# Patient Record
Sex: Male | Born: 1985 | Race: White | Hispanic: No | Marital: Single | Smoking: Former smoker
Health system: Southern US, Community
[De-identification: ages and names within clinical notes are randomized; demographics above are authoritative.]

## PROBLEM LIST (undated history)

## (undated) DIAGNOSIS — I10 Essential (primary) hypertension: Secondary | ICD-10-CM

## (undated) DIAGNOSIS — F988 Other specified behavioral and emotional disorders with onset usually occurring in childhood and adolescence: Secondary | ICD-10-CM

## (undated) DIAGNOSIS — R079 Chest pain, unspecified: Secondary | ICD-10-CM

## (undated) DIAGNOSIS — R0789 Other chest pain: Secondary | ICD-10-CM

## (undated) DIAGNOSIS — K219 Gastro-esophageal reflux disease without esophagitis: Secondary | ICD-10-CM

## (undated) DIAGNOSIS — G4730 Sleep apnea, unspecified: Secondary | ICD-10-CM

## (undated) DIAGNOSIS — R6889 Other general symptoms and signs: Secondary | ICD-10-CM

## (undated) DIAGNOSIS — F32A Depression, unspecified: Secondary | ICD-10-CM

## (undated) DIAGNOSIS — N200 Calculus of kidney: Secondary | ICD-10-CM

## (undated) DIAGNOSIS — F329 Major depressive disorder, single episode, unspecified: Secondary | ICD-10-CM

## (undated) DIAGNOSIS — R7303 Prediabetes: Secondary | ICD-10-CM

## (undated) HISTORY — DX: Other specified behavioral and emotional disorders with onset usually occurring in childhood and adolescence: F98.8

## (undated) HISTORY — DX: Gastro-esophageal reflux disease without esophagitis: K21.9

## (undated) HISTORY — DX: Other general symptoms and signs: R68.89

## (undated) HISTORY — DX: Sleep apnea, unspecified: G47.30

## (undated) HISTORY — DX: Calculus of kidney: N20.0

## (undated) HISTORY — DX: Essential (primary) hypertension: I10

## (undated) HISTORY — DX: Other chest pain: R07.89

## (undated) HISTORY — DX: Depression, unspecified: F32.A

## (undated) HISTORY — PX: KIDNEY STONE SURGERY: SHX686

## (undated) HISTORY — DX: Chest pain, unspecified: R07.9

## (undated) HISTORY — PX: FACIAL COSMETIC SURGERY: SHX629

## (undated) HISTORY — PX: OTHER SURGICAL HISTORY: SHX169

## (undated) HISTORY — DX: Major depressive disorder, single episode, unspecified: F32.9

---
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
## 2004-12-11 ENCOUNTER — Ambulatory Visit: Payer: Self-pay | Admitting: Internal Medicine

## 2009-02-13 ENCOUNTER — Telehealth: Payer: Self-pay | Admitting: Internal Medicine

## 2009-02-16 ENCOUNTER — Ambulatory Visit: Payer: Self-pay | Admitting: Internal Medicine

## 2009-02-16 DIAGNOSIS — G4730 Sleep apnea, unspecified: Secondary | ICD-10-CM

## 2009-02-16 DIAGNOSIS — G4700 Insomnia, unspecified: Secondary | ICD-10-CM | POA: Insufficient documentation

## 2009-02-17 ENCOUNTER — Encounter: Payer: Self-pay | Admitting: Internal Medicine

## 2009-02-17 DIAGNOSIS — K219 Gastro-esophageal reflux disease without esophagitis: Secondary | ICD-10-CM | POA: Insufficient documentation

## 2009-02-17 DIAGNOSIS — F329 Major depressive disorder, single episode, unspecified: Secondary | ICD-10-CM | POA: Insufficient documentation

## 2009-02-17 DIAGNOSIS — F3289 Other specified depressive episodes: Secondary | ICD-10-CM | POA: Insufficient documentation

## 2009-03-14 ENCOUNTER — Ambulatory Visit: Payer: Self-pay | Admitting: Internal Medicine

## 2009-03-15 ENCOUNTER — Telehealth: Payer: Self-pay | Admitting: Internal Medicine

## 2009-09-29 ENCOUNTER — Telehealth: Payer: Self-pay | Admitting: Internal Medicine

## 2010-03-06 ENCOUNTER — Ambulatory Visit: Payer: Self-pay | Admitting: Internal Medicine

## 2010-03-06 DIAGNOSIS — F909 Attention-deficit hyperactivity disorder, unspecified type: Secondary | ICD-10-CM | POA: Insufficient documentation

## 2010-08-26 LAB — CONVERTED CEMR LAB
Basophils Absolute: 0.1 10*3/uL (ref 0.0–0.1)
Basophils Relative: 1 % (ref 0.0–3.0)
Cholesterol: 236 mg/dL — ABNORMAL HIGH (ref 0–200)
Direct LDL: 183.3 mg/dL
Eosinophils Absolute: 0.2 10*3/uL (ref 0.0–0.7)
Eosinophils Relative: 2.8 % (ref 0.0–5.0)
HCT: 44.4 % (ref 39.0–52.0)
HDL: 27.4 mg/dL — ABNORMAL LOW (ref >39.00)
Hemoglobin: 15.3 g/dL (ref 13.0–17.0)
Lymphocytes Relative: 43.5 % (ref 12.0–46.0)
Lymphs Abs: 2.5 10*3/uL (ref 0.7–4.0)
MCHC: 34.6 g/dL (ref 30.0–36.0)
MCV: 87.1 fL (ref 78.0–100.0)
Monocytes Absolute: 0.4 10*3/uL (ref 0.1–1.0)
Monocytes Relative: 7.7 % (ref 3.0–12.0)
Neutro Abs: 2.6 10*3/uL (ref 1.4–7.7)
Neutrophils Relative %: 45 % (ref 43.0–77.0)
Platelets: 232 10*3/uL (ref 150.0–400.0)
RBC: 5.1 M/uL (ref 4.22–5.81)
RDW: 12.1 % (ref 11.5–14.6)
TSH: 0.89 microintl units/mL (ref 0.35–5.50)
Total CHOL/HDL Ratio: 9
Triglycerides: 148 mg/dL (ref 0.0–149.0)
VLDL: 29.6 mg/dL (ref 0.0–40.0)
WBC: 5.8 10*3/uL (ref 4.5–10.5)

## 2010-08-28 NOTE — Progress Notes (Signed)
Summary: re establish  Phone Note Call from Patient Call back at [phone]   Caller: Mom-Kay Summary of Call: pt has not been seen since 2006 and would like to be seen asap... having bp problems 142/101 Initial call taken by: Migdalia Dk,  February 13, 2009 4:20 PM  Follow-up for Phone Call        OK to re-est Follow-up by: Tresa Garter MD,  February 13, 2009 6:12 PM  Additional Follow-up for Phone Call Additional follow up Details #1::        MOTHER WANTED SOONER THAN DR. PLOTNIKOV HAD (MID AUG).  SHE SCHEDULED HIM TO SEE DR. Yetta Barre ON JULY 22. Additional Follow-up by: Hilarie Fredrickson,  February 14, 2009 9:46 AM

## 2010-08-28 NOTE — Letter (Signed)
Summary: Results Follow-up Letter  [facility]  [address] [place], [place] [address]   Phone: [phone]  Fax: [phone]    02/17/2009  [address] [place], [place]  [address]  Dear Cory Little,   The following are the results of your recent test(s):  Test     Result     CBC       normal Thyroid     normal   _________________________________________________________  Please call for an appointment in 1-2 months _________________________________________________________ _________________________________________________________ _________________________________________________________  Sincerely,  Sanda Linger MD [facility]

## 2010-08-28 NOTE — Progress Notes (Signed)
Summary: med alternative  Phone Note From Pharmacy   Caller: [facility] Reason for Call: Medication not on formulary Summary of Call: Per pharmacy insurance will not cover Lunesta 3mg . The preferred are zaleplon or zolpidem only. Please advise Initial call taken by: Rock Nephew CMA,  March 15, 2009 10:51 AM  Follow-up for Phone Call        please call this in Follow-up by: Etta Grandchild MD,  March 15, 2009 11:03 AM  Additional Follow-up for Phone Call Additional follow up Details #1::        rx called in Additional Follow-up by: Rock Nephew CMA,  March 15, 2009 11:42 AM    New/Updated Medications: AMBIEN 10 MG TABS (ZOLPIDEM TARTRATE) One by mouth at bedtime as needed for insomnia Prescriptions: AMBIEN 10 MG TABS (ZOLPIDEM TARTRATE) One by mouth at bedtime as needed for insomnia  #30 x 2   Entered by:   Rock Nephew CMA   Authorized by:   Etta Grandchild MD   Signed by:   Rock Nephew CMA on 03/15/2009   Method used:   Telephoned to ...       [facility] [address] [phone]* (retail)       [address]       [place], [place]  [address]       Ph: [phone] or [phone]       Fax: [phone]   RxID:   [id number] AMBIEN 10 MG TABS (ZOLPIDEM TARTRATE) One by mouth at bedtime as needed for insomnia  #30 x 2   Entered and Authorized by:   Etta Grandchild MD   Signed by:   Etta Grandchild MD on 03/15/2009   Method used:   Historical   RxID:   [id number]

## 2010-08-28 NOTE — Progress Notes (Signed)
  Phone Note Refill Request Message from:  Fax from Pharmacy on September 29, 2009 2:12 PM  Refills Requested: Medication #1:  AMBIEN 10 MG TABS One by mouth at bedtime as needed for insomnia.   Dosage confirmed as above?Dosage Confirmed   Supply Requested: 1 month   Last Refilled: 07/28/2009  Method Requested: Telephone to Pharmacy Initial call taken by: Rock Nephew CMA,  September 29, 2009 2:13 PM    Prescriptions: AMBIEN 10 MG TABS (ZOLPIDEM TARTRATE) One by mouth at bedtime as needed for insomnia  #30 x 4   Entered by:   Rock Nephew CMA   Authorized by:   Etta Grandchild MD   Signed by:   Rock Nephew CMA on 09/29/2009   Method used:   Telephoned to ...       [facility] [address] [phone]* (retail)       [address]       [place], [place]  [address]       Ph: [phone] or [phone]       Fax: [phone]   RxID:   [id number]

## 2010-08-28 NOTE — Assessment & Plan Note (Signed)
Summary: FOLLOW UP RX-LB   Vital Signs:  Patient profile:   25 year old male Height:      72 inches Weight:      278 pounds BMI:     37.84 O2 Sat:      97 % on Room air Temp:     97.7 degrees F oral Pulse rate:   69 / minute Pulse rhythm:   regular Resp:     16 per minute BP sitting:   120 / 82  (left arm) Cuff size:   large  Vitals Entered By: Rock Nephew CMA (March 06, 2010 8:50 AM)  Nutrition Counseling: Patient's BMI is greater than 25 and therefore counseled on weight management options.  O2 Flow:  Room air CC: follow-up visit// need med refilll Is Patient Diabetic? No Pain Assessment Patient in pain? no        Primary Care Provider:  Etta Grandchild MD  CC:  follow-up visit// need med refilll.  History of Present Illness: He returns for f/up and requests an Rx for Adderall, says that he has a hx. of ADHD and he is starting school at [employer] this fall and needs better focus and attention to perform well in school. He still occasionall has insomnia and is doing well with the occasional dose of Ambien.  Preventive Screening-Counseling & Management  Alcohol-Tobacco     Alcohol drinks/day: 0     Smoking Status: quit < 6 months     Smoking Cessation Counseling: yes     Smoke Cessation Stage: quit     Year Quit: 2011     Tobacco Counseling: to remain off tobacco products  Hep-HIV-STD-Contraception     Hepatitis Risk: no risk noted     HIV Risk: no risk noted     STD Risk: no risk noted  Safety-Violence-Falls     Seat Belt Use: yes     Helmet Use: yes     Firearms in the Home: no firearms in the home     Smoke Detectors: no     Violence in the Home: no risk noted     Sexual Abuse: no      Sexual History:  currently monogamous.        Drug Use:  never.        Blood Transfusions:  no.    Clinical Review Panels:  Immunizations   Last Tetanus Booster:  Tdap (07/29/2000)  Lipid Management   Cholesterol:  236 (02/16/2009)   HDL (good cholesterol):   60.45 (02/16/2009)  CBC   WBC:  5.8 (02/16/2009)   RBC:  5.10 (02/16/2009)   Hgb:  15.3 (02/16/2009)   Hct:  44.4 (02/16/2009)   Platelets:  232.0 (02/16/2009)   MCV  87.1 (02/16/2009)   MCHC  34.6 (02/16/2009)   RDW  12.1 (02/16/2009)   PMN:  45.0 (02/16/2009)   Lymphs:  43.5 (02/16/2009)   Monos:  7.7 (02/16/2009)   Eosinophils:  2.8 (02/16/2009)   Basophil:  1.0 (02/16/2009)   Medications Prior to Update: 1)  Ambien 10 Mg Tabs (Zolpidem Tartrate) .... One By Mouth At Bedtime As Needed For Insomnia  Current Medications (verified): 1)  Ambien 10 Mg Tabs (Zolpidem Tartrate) .... One By Mouth At Bedtime As Needed For Insomnia 2)  Adderall 20 Mg Tabs (Amphetamine-Dextroamphetamine) .... Take 1 Tablet By Mouth Three Times A Day  Allergies (verified): No Known Drug Allergies  Past History:  Past Medical History: Last updated: 02/16/2009 Depression GERD  Past Surgical  History: Last updated: 02/16/2009 Denies surgical history  Family History: Last updated: 02/16/2009 Family History of Alcoholism/Addiction  Social History: Last updated: 03/06/2010 Occupation: unemployed Single Alcohol use-no Drug use-no Regular exercise-no  Risk Factors: Alcohol Use: 0 (03/06/2010) Exercise: no (02/16/2009)  Risk Factors: Smoking Status: quit < 6 months (03/06/2010)  Family History: Reviewed history from 02/16/2009 and no changes required. Family History of Alcoholism/Addiction  Social History: Reviewed history from 02/16/2009 and no changes required. Occupation: unemployed Single Alcohol use-no Drug use-no Regular exercise-no Smoking Status:  quit < 6 months Hepatitis Risk:  no risk noted HIV Risk:  no risk noted STD Risk:  no risk noted Seat Belt Use:  yes Sexual History:  currently monogamous Drug Use:  never Blood Transfusions:  no  Review of Systems  The patient denies hoarseness, chest pain, dyspnea on exertion, abdominal pain, difficulty walking,  depression, enlarged lymph nodes, and testicular masses.   Psych:  Denies alternate hallucination ( auditory/visual), anxiety, depression, easily angered, easily tearful, irritability, mental problems, panic attacks, suicidal thoughts/plans, thoughts of violence, unusual visions or sounds, and thoughts /plans of harming others.  Physical Exam  General:  alert, well-developed, well-nourished, well-hydrated, cooperative to examination, good hygiene, and overweight-appearing.   Head:  normocephalic and atraumatic.   Mouth:  Oral mucosa and oropharynx without lesions or exudates.  Teeth in good repair. Neck:  No deformities, masses, or tenderness noted. Lungs:  Normal respiratory effort, chest expands symmetrically. Lungs are clear to auscultation, no crackles or wheezes. Heart:  Normal rate and regular rhythm. S1 and S2 normal without gallop, murmur, click, rub or other extra sounds. Abdomen:  soft, non-tender, normal bowel sounds, no distention, no masses, no guarding, no rigidity, no rebound tenderness, no abdominal hernia, and no inguinal hernia.   Msk:  No deformity or scoliosis noted of thoracic or lumbar spine.   Pulses:  R and L carotid,radial,femoral,dorsalis pedis and posterior tibial pulses are full and equal bilaterally Extremities:  No clubbing, cyanosis, edema, or deformity noted with normal full range of motion of all joints.   Neurologic:  No cranial nerve deficits noted. Station and gait are normal. Plantar reflexes are down-going bilaterally. DTRs are symmetrical throughout. Sensory, motor and coordinative functions appear intact. Skin:  turgor normal, color normal, no rashes, no suspicious lesions, no ecchymoses, no petechiae, no purpura, no ulcerations, no edema, and tattoo(s).   Cervical Nodes:  No lymphadenopathy noted Psych:  Cognition and judgment appear intact. Alert and cooperative with normal attention span and concentration. No apparent delusions, illusions,  hallucinations   Impression & Recommendations:  Problem # 1:  ADHD (ICD-314.01) ritalin  Problem # 2:  INSOMNIA (ICD-780.52) Assessment: Unchanged  His updated medication list for this problem includes:    Ambien 10 Mg Tabs (Zolpidem tartrate) ..... One by mouth at bedtime as needed for insomnia  Discussed sleep hygiene.   Problem # 3:  DEPRESSION (ICD-311) Assessment: Improved  Discussed treatment options, including trial of antidpressant medication. Will refer to behavioral health. Follow-up call in in 24-48 hours and recheck in 2 weeks, sooner as needed. Patient agrees to call if any worsening of symptoms or thoughts of doing harm arise. Verified that the patient has no suicidal ideation at this time.   Complete Medication List: 1)  Ambien 10 Mg Tabs (Zolpidem tartrate) .... One by mouth at bedtime as needed for insomnia 2)  Adderall 20 Mg Tabs (Amphetamine-dextroamphetamine) .... Take 1 tablet by mouth three times a day  Patient Instructions: 1)  Please schedule  a follow-up appointment in 3 months. 2)  It is important that you exercise regularly at least 20 minutes 5 times a week. If you develop chest pain, have severe difficulty breathing, or feel very tired , stop exercising immediately and seek medical attention. 3)  You need to lose weight. Consider a lower calorie diet and regular exercise.  Prescriptions: ADDERALL 20 MG TABS (AMPHETAMINE-DEXTROAMPHETAMINE) Take 1 tablet by mouth three times a day  #90 x 0   Entered and Authorized by:   Etta Grandchild MD   Signed by:   Etta Grandchild MD on 03/06/2010   Method used:   Print then Give to Patient   RxID:   [id number] AMBIEN 10 MG TABS (ZOLPIDEM TARTRATE) One by mouth at bedtime as needed for insomnia  #30 x 3   Entered and Authorized by:   Etta Grandchild MD   Signed by:   Etta Grandchild MD on 03/06/2010   Method used:   Print then Give to Patient   RxID:   [id number]

## 2010-08-28 NOTE — Assessment & Plan Note (Signed)
Summary: FU/ HEADACHES/[facility]WS $50   Vital Signs:  Patient profile:   25 year old male Height:      72 inches Weight:      269 pounds O2 Sat:      97 % on Room air Temp:     97.4 degrees F oral Pulse rate:   75 / minute Pulse rhythm:   regular BP sitting:   118 / 78  (right arm) Cuff size:   large  Vitals Entered By: Rock Nephew CMA (March 14, 2009 9:54 AM)  O2 Flow:  Room air  Primary Care Provider:  Etta Grandchild MD   History of Present Illness: He returns for f/up and he requests a refill on Lunesta, it helps him stay asleep longer. He does occasionally get a headache if he doesn't get enough sleep.  Preventive Screening-Counseling & Management  Alcohol-Tobacco     Smoking Cessation Counseling: yes  Current Medications (verified): 1)  Lunesta 3 Mg Tabs (Eszopiclone) .... One Po At Bedtime As Needed For Insomnia  Allergies (verified): No Known Drug Allergies  Past History:  Past Medical History: Reviewed history from 02/16/2009 and no changes required. Depression GERD  Past Surgical History: Reviewed history from 02/16/2009 and no changes required. Denies surgical history  Family History: Reviewed history from 02/16/2009 and no changes required. Family History of Alcoholism/Addiction  Social History: Reviewed history from 02/16/2009 and no changes required. 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  The patient denies anorexia, fever, chest pain, prolonged cough, abdominal pain, difficulty walking, and depression.    Physical Exam  General:  alert, well-developed, well-nourished, well-hydrated, cooperative to examination, good hygiene, and overweight-appearing.   Mouth:  Oral mucosa and oropharynx without lesions or exudates.  Teeth in good repair. Neck:  No deformities, masses, or tenderness noted. Lungs:  Normal respiratory  effort, chest expands symmetrically. Lungs are clear to auscultation, no crackles or wheezes. Heart:  Normal rate and regular rhythm. S1 and S2 normal without gallop, murmur, click, rub or other extra sounds. Abdomen:  soft, non-tender, normal bowel sounds, no distention, no masses, no guarding, no rigidity, no rebound tenderness, no abdominal hernia, and no inguinal hernia.   Msk:  No deformity or scoliosis noted of thoracic or lumbar spine.   Extremities:  No clubbing, cyanosis, edema, or deformity noted with normal full range of motion of all joints.   Neurologic:  No cranial nerve deficits noted. Station and gait are normal. Plantar reflexes are down-going bilaterally. DTRs are symmetrical throughout. Sensory, motor and coordinative functions appear intact. Skin:  turgor normal, color normal, no rashes, no suspicious lesions, no ecchymoses, no petechiae, no purpura, no ulcerations, no edema, and tattoo(s).   Psych:  Cognition and judgment appear intact. Alert and cooperative with normal attention span and concentration. No apparent delusions, illusions, hallucinations   Impression & Recommendations:  Problem # 1:  INSOMNIA (ICD-780.52) Assessment Improved  His updated medication list for this problem includes:    Lunesta 3 Mg Tabs (Eszopiclone) ..... One po at bedtime as needed for insomnia  Problem # 2:  DEPRESSION (ICD-311) Assessment: Improved  Complete Medication List: 1)  Lunesta 3 Mg Tabs (Eszopiclone) .... One po at bedtime as needed for insomnia  Patient Instructions: 1)  Please schedule a follow-up appointment in 2 months. 2)  It is important that you exercise regularly at least 20 minutes  5 times a week. If you develop chest pain, have severe difficulty breathing, or feel very tired , stop exercising immediately and seek medical attention. 3)  You need to lose weight. Consider a lower calorie diet and regular exercise.  4)  Tobacco is very bad for your health and your loved  ones! You Should stop smoking!. 5)  Stop Smoking Tips: Choose a Quit date. Cut down before the Quit date. decide what you will do as a substitute when you feel the urge to smoke(gum,toothpick,exercise). Prescriptions: LUNESTA 3 MG TABS (ESZOPICLONE) one po at bedtime as needed for insomnia  #30 x 3   Entered and Authorized by:   Etta Grandchild MD   Signed by:   Etta Grandchild MD on 03/14/2009   Method used:   Print then Give to Patient   RxID:   [id number]

## 2010-08-28 NOTE — Letter (Signed)
Summary: Lipid Letter  [facility]  [address] [place], [place] [address]   Phone: [phone]  Fax: [phone]    02/17/2009  Cory Little [address] [place], [place]  [address]  Dear Beulah Gandy:  We have carefully reviewed your last lipid profile from  and the results are noted below with a summary of recommendations for lipid management.    Cholesterol:       236     Goal: <200   HDL "good" Cholesterol:   57.84     Goal: >40   LDL "bad" Cholesterol:   183     Goal: <130   Triglycerides:       148.0     Goal: <150    WOW. these numbers are bad.    TLC Diet (Therapeutic Lifestyle Change): Saturated Fats & Transfatty acids should be kept < 7% of total calories ***Reduce Saturated Fats Polyunstaurated Fat can be up to 10% of total calories Monounsaturated Fat Fat can be up to 20% of total calories Total Fat should be no greater than 25-35% of total calories Carbohydrates should be 50-60% of total calories Protein should be approximately 15% of total calories Fiber should be at least 20-30 grams a day ***Increased fiber may help lower LDL Total Cholesterol should be < 200mg /day Consider adding plant stanol/sterols to diet (example: Benacol spread) ***A higher intake of unsaturated fat may reduce Triglycerides and Increase HDL    Adjunctive Measures (may lower LIPIDS and reduce risk of Heart Attack) include: Aerobic Exercise (20-30 minutes 3-4 times a week) Limit Alcohol Consumption Weight Reduction Aspirin 75-81 mg a day by mouth (if not allergic or contraindicated) Dietary Fiber 20-30 grams a day by mouth     Current Medications: 1)    Lunesta 3 Mg Tabs (Eszopiclone) .... One po at bedtime as needed for insomnia  If you have any questions, please call. We appreciate being able to work with you.   Sincerely,    [facility]

## 2010-08-28 NOTE — Assessment & Plan Note (Signed)
Summary: NEW/ [employer]   Vital Signs:  Patient profile:   25 year old male Height:      72 inches Weight:      268 pounds BMI:     36.48 O2 Sat:      95 % on Room air Temp:     97.5 degrees F oral Pulse rate:   71 / minute Pulse rhythm:   regular BP sitting:   118 / 82  (left arm) Cuff size:   large  Vitals Entered By: Rock Nephew CMA (February 16, 2009 1:09 PM)  Nutrition Counseling: Patient's BMI is greater than 25 and therefore counseled on weight management options.  O2 Flow:  Room air  History of Present Illness: New to me c/o intermittent insomnia for several months.  He admits to drinking caffeinated sodas at dinner. Has DFA that interferes with next day  activities.  Preventive Screening-Counseling & Management  Alcohol-Tobacco     Smoking Status: current     Smoking Cessation Counseling: yes  Caffeine-Diet-Exercise     Does Patient Exercise: no      Drug Use:  no.    Current Medications (verified): 1)  None  Allergies (verified): No Known Drug Allergies  Past History:  Past Medical History: Depression GERD  Past Surgical History: Denies surgical history  Family History: Family History of Alcoholism/Addiction  Social History: Occupation: Market researcher Single Current Smoker Alcohol use-yes Drug use-no Regular exercise-no Smoking Status:  current Drug Use:  no Does Patient Exercise:  no  Review of Systems       The patient complains of weight gain.  The patient denies anorexia, fever, chest pain, dyspnea on exertion, peripheral edema, prolonged cough, headaches, hemoptysis, abdominal pain, melena, hematochezia, severe indigestion/heartburn, difficulty walking, depression, enlarged lymph nodes, angioedema, and testicular masses.   Psych:  Denies anxiety, depression, easily angered, easily tearful, irritability, mental problems, panic attacks, sense of great danger, suicidal thoughts/plans, thoughts of violence, unusual visions or sounds, and  thoughts /plans of harming others.  Physical Exam  General:  alert, well-developed, well-nourished, well-hydrated, cooperative to examination, good hygiene, and overweight-appearing.   Head:  normocephalic and atraumatic.   Eyes:  No corneal or conjunctival inflammation noted. EOMI. Perrla. Funduscopic exam benign, without hemorrhages, exudates or papilledema. Vision grossly normal. Ears:  External ear exam shows no significant lesions or deformities.  Otoscopic examination reveals clear canals, tympanic membranes are intact bilaterally without bulging, retraction, inflammation or discharge. Hearing is grossly normal bilaterally. Nose:  External nasal examination shows no deformity or inflammation. Nasal mucosa are pink and moist without lesions or exudates. Mouth:  Oral mucosa and oropharynx without lesions or exudates.  Teeth in good repair. Neck:  No deformities, masses, or tenderness noted. Breasts:  No masses or gynecomastia noted Lungs:  Normal respiratory effort, chest expands symmetrically. Lungs are clear to auscultation, no crackles or wheezes. Heart:  Normal rate and regular rhythm. S1 and S2 normal without gallop, murmur, click, rub or other extra sounds. Abdomen:  soft, non-tender, normal bowel sounds, no distention, no masses, no guarding, no rigidity, no rebound tenderness, no abdominal hernia, and no inguinal hernia.   Genitalia:  circumcised, no hydrocele, no varicocele, no scrotal masses, no testicular masses or atrophy, no cutaneous lesions, and no urethral discharge.   Msk:  No deformity or scoliosis noted of thoracic or lumbar spine.   Pulses:  R and L carotid,radial,femoral,dorsalis pedis and posterior tibial pulses are full and equal bilaterally Extremities:  No clubbing, cyanosis, edema,  or deformity noted with normal full range of motion of all joints.   Neurologic:  No cranial nerve deficits noted. Station and gait are normal. Plantar reflexes are down-going bilaterally.  DTRs are symmetrical throughout. Sensory, motor and coordinative functions appear intact. Skin:  turgor normal, color normal, no rashes, no suspicious lesions, no ecchymoses, no petechiae, no purpura, no ulcerations, no edema, and tattoo(s).   Cervical Nodes:  No lymphadenopathy noted Axillary Nodes:  No palpable lymphadenopathy Inguinal Nodes:  No significant adenopathy Psych:  Cognition and judgment appear intact. Alert and cooperative with normal attention span and concentration. No apparent delusions, illusions, hallucinations Additional Exam:  EKG is normal.   Impression & Recommendations:  Problem # 1:  ROUTINE GENERAL MEDICAL EXAM@HEALTH  CARE FACL (ICD-V70.0) Assessment New  I discussed with the patient the need and technique for monthly testicular self-exam and skin exams.  I reiterated the need for healthy,safe living with respect to monogamous and protected sex, limited use of alcohol, avoiding drug abuse, no risk taking, and safe driving/seat belt usage.   Orders: TLB-Lipid Panel (80061-LIPID) TLB-CBC Platelet - w/Differential (85025-CBCD) TLB-TSH (Thyroid Stimulating Hormone) (84443-TSH) Venipuncture (14782) EKG w/ Interpretation (93000)  Problem # 2:  INSOMNIA (ICD-780.52) Assessment: New no caffeine after 4 pm. His updated medication list for this problem includes:    Lunesta 3 Mg Tabs (Eszopiclone) ..... One po at bedtime as needed for insomnia  Orders: TLB-Lipid Panel (80061-LIPID) TLB-CBC Platelet - w/Differential (85025-CBCD) TLB-TSH (Thyroid Stimulating Hormone) (84443-TSH) Venipuncture (95621)  Complete Medication List: 1)  Lunesta 3 Mg Tabs (Eszopiclone) .... One po at bedtime as needed for insomnia  Patient Instructions: 1)  Please schedule a follow-up appointment in 1 month. 2)  Tobacco is very bad for your health and your loved ones! You Should stop smoking!. 3)  Stop Smoking Tips: Choose a Quit date. Cut down before the Quit date. decide what you will  do as a substitute when you feel the urge to smoke(gum,toothpick,exercise). 4)  It is important that you exercise regularly at least 20 minutes 5 times a week. If you develop chest pain, have severe difficulty breathing, or feel very tired , stop exercising immediately and seek medical attention. 5)  You need to lose weight. Consider a lower calorie diet and regular exercise.  Prescriptions: LUNESTA 3 MG TABS (ESZOPICLONE) one po at bedtime as needed for insomnia  #7 x 0   Entered and Authorized by:   Etta Grandchild MD   Signed by:   Etta Grandchild MD on 02/16/2009   Method used:   Print then Give to Patient   RxID:   [id number]   Preventive Care Screening  Last Tetanus Booster:    Date:  07/29/2000    Results:  Tdap

## 2013-10-11 ENCOUNTER — Emergency Department (INDEPENDENT_AMBULATORY_CARE_PROVIDER_SITE_OTHER)
Admission: EM | Admit: 2013-10-11 | Discharge: 2013-10-11 | Disposition: A | Discharge status: Nursing Home (Includes ICF) | Payer: Self-pay | Source: Home / Self Care | Attending: Family Medicine | Admitting: Family Medicine

## 2013-10-11 ENCOUNTER — Encounter (HOSPITAL_COMMUNITY): Payer: Self-pay | Admitting: Emergency Medicine

## 2013-10-11 ENCOUNTER — Emergency Department (HOSPITAL_COMMUNITY)
Admission: EM | Admit: 2013-10-11 | Discharge: 2013-10-11 | Disposition: A | Discharge status: Home / Self Care | Payer: Self-pay | Attending: Emergency Medicine | Admitting: Emergency Medicine

## 2013-10-11 ENCOUNTER — Emergency Department (HOSPITAL_COMMUNITY): Payer: Self-pay

## 2013-10-11 DIAGNOSIS — N50812 Left testicular pain: Secondary | ICD-10-CM

## 2013-10-11 DIAGNOSIS — Z87442 Personal history of urinary calculi: Secondary | ICD-10-CM | POA: Insufficient documentation

## 2013-10-11 DIAGNOSIS — N508 Other specified disorders of male genital organs: Secondary | ICD-10-CM

## 2013-10-11 DIAGNOSIS — N433 Hydrocele, unspecified: Secondary | ICD-10-CM

## 2013-10-11 DIAGNOSIS — F17200 Nicotine dependence, unspecified, uncomplicated: Secondary | ICD-10-CM | POA: Insufficient documentation

## 2013-10-11 HISTORY — DX: Calculus of kidney: N20.0

## 2013-10-11 LAB — POCT URINALYSIS DIP (DEVICE)
Bilirubin Urine: NEGATIVE
Glucose, UA: NEGATIVE mg/dL
Ketones, ur: NEGATIVE mg/dL
Leukocytes, UA: NEGATIVE
Nitrite: NEGATIVE
Protein, ur: NEGATIVE mg/dL
Specific Gravity, Urine: >=1.03 (ref 1.005–1.030)
Urobilinogen, UA: 1 mg/dL (ref 0.0–1.0)
pH: 5 (ref 5.0–8.0)

## 2013-10-11 MED ORDER — NAPROXEN 250 MG PO TABS: 250.0000 mg | ORAL_TABLET | Freq: Two times a day (BID) | ORAL | Status: DC

## 2013-10-11 MED ORDER — HYDROCODONE-ACETAMINOPHEN 5-325 MG PO TABS: ORAL_TABLET | ORAL | Status: DC

## 2013-10-11 NOTE — ED Notes (Addendum)
28 yr old male is here today with complaints of left testicle pain for one week. He states he has not fallen or been injured. He states the pain just comes and goes. He states that the left testicle is swollen alittle no a whole lot. Denies: penis discharge, painful sexual intercourse, sob, chest pain, fever, no unprotected sex

## 2013-10-11 NOTE — ED Provider Notes (Signed)
CSN: [id number]     Arrival date & time 10/11/13  1647 History   First MD Initiated Contact with Patient 10/11/13 1804     No chief complaint on file.  (Consider location/radiation/quality/duration/timing/severity/associated sxs/prior Treatment) Patient is a 28 y.o. male presenting with testicular pain. The history is provided by the patient.  Testicle Pain This is a new problem. The current episode started more than 1 week ago. The problem has been gradually worsening. Pertinent negatives include no abdominal pain.    History reviewed. No pertinent past medical history. History reviewed. No pertinent past surgical history. No family history on file. History  Substance Use Topics  . Smoking status: Never Smoker   . Smokeless tobacco: Not on file  . Alcohol Use: Yes    Review of Systems  Gastrointestinal: Negative for nausea, vomiting and abdominal pain.  Genitourinary: Positive for scrotal swelling and testicular pain. Negative for dysuria, urgency, frequency, hematuria, flank pain and discharge.    Allergies  Review of patient's allergies indicates no known allergies.  Home Medications  No current outpatient prescriptions on file. BP 168/103  Pulse 99  Temp(Src) 98 F (36.7 C) (Oral)  Resp 16  SpO2 98% Physical Exam  Nursing note and vitals reviewed. Constitutional: He is oriented to person, place, and time. He appears well-developed and well-nourished.  Abdominal: Bowel sounds are normal. There is no tenderness. Hernia confirmed negative in the right inguinal area and confirmed negative in the left inguinal area.  Genitourinary: Penis normal. Left testis shows mass, swelling and tenderness. Circumcised.  Lymphadenopathy:       Right: No inguinal adenopathy present.       Left: No inguinal adenopathy present.  Neurological: He is alert and oriented to person, place, and time.  Skin: Skin is warm and dry.    ED Course  Procedures (including critical care time) Labs  Review Labs Reviewed - No data to display Imaging Review No results found.   MDM  No diagnosis found. Sent for testicular u/s or scan for eval of tender left testicular pain , swelling and hard mass.    Linna HoffJames D Kieu Quiggle, MD 10/11/13 [phone]

## 2013-10-11 NOTE — ED Notes (Signed)
Patient with history of left testicular swelling.  The swelling has been going on for one week, with pain off and on during the week.  [facility].  Patient is CAOx3.  Patient denies any chest pain, shortness of breath, no pain with intercourse and no pain when urinating.

## 2013-10-11 NOTE — ED Provider Notes (Signed)
CSN: [id number]     Arrival date & time 10/11/13  1913 History   First MD Initiated Contact with Patient 10/11/13 2301     Chief Complaint  Patient presents with  . Testicle Pain      HPI Pt was seen at 2300. Per pt, c/o gradual onset and persistence of constant left testicular "pain" and "swelling" for the past 1 week. Pt states the swelling "comes and goes." [facility] then sent to the ED for testicular US. Denies injury, no rash, no abd pain, no flank pain, no N/V/D, no penile drainage, no dysuria/hematuria.    Uro: Dr. Retta Dionesahlstedt Past Medical History  Diagnosis Date  . Kidney stone 2004   History reviewed. No pertinent past surgical history.  History  Substance Use Topics  . Smoking status: Light Tobacco Smoker  . Smokeless tobacco: Not on file  . Alcohol Use: Yes     Comment: socially    Review of Systems ROS: Statement: All systems negative except as marked or noted in the HPI; Constitutional: Negative for fever and chills. ; ; Eyes: Negative for eye pain, redness and discharge. ; ; ENMT: Negative for ear pain, hoarseness, nasal congestion, sinus pressure and sore throat. ; ; Cardiovascular: Negative for chest pain, palpitations, diaphoresis, dyspnea and peripheral edema. ; ; Respiratory: Negative for cough, wheezing and stridor. ; ; Gastrointestinal: Negative for nausea, vomiting, diarrhea, abdominal pain, blood in stool, hematemesis, jaundice and rectal bleeding. . ; ; Genitourinary: Negative for dysuria, flank pain and hematuria. ; ; Genital:  No penile drainage or rash, +left testicular pain and swelling, no scrotal rash or swelling.;; Musculoskeletal: Negative for back pain and neck pain. Negative for swelling and trauma.; ; Skin: Negative for pruritus, rash, abrasions, blisters, bruising and skin lesion.; ; Neuro: Negative for headache, lightheadedness and neck stiffness. Negative for weakness, altered level of consciousness , altered mental status, extremity  weakness, paresthesias, involuntary movement, seizure and syncope.      Allergies  Review of patient's allergies indicates no known allergies.  Home Medications  No current outpatient prescriptions on file. BP 165/94  Pulse 107  Temp(Src) 97.3 F (36.3 C) (Oral)  Resp 16  Ht 5\' 9"  (1.753 m)  Wt 262 lb 3.2 oz (118.933 kg)  BMI 38.70 kg/m2  SpO2 99% Physical Exam 2305: Physical examination:  Nursing notes reviewed; Vital signs and O2 SAT reviewed;  Constitutional: Well developed, Well nourished, Well hydrated, In no acute distress; Head:  Normocephalic, atraumatic; Eyes: EOMI, PERRL, No scleral icterus; ENMT: Mouth and pharynx normal, Mucous membranes moist; Neck: Supple, Full range of motion, No lymphadenopathy; Cardiovascular: Regular rate and rhythm, No murmur, rub, or gallop; Respiratory: Breath sounds clear & equal bilaterally, No rales, rhonchi, wheezes.  Speaking full sentences with ease, Normal respiratory effort/excursion; Chest: Nontender, Movement normal; Abdomen: Soft, Nontender, Nondistended, Normal bowel sounds; Genitourinary: No CVA tenderness. Genital exam performed with pt permission and male ED RN chaperone present during exam. No perineal erythema.  No penile lesions or drainage. +mild left testicular edema and tenderness to palp, no erythema, no ecchymosis, no rash. No right scrotal erythema, edema or tenderness to palp.  Normal testicular lie.  No testicular tenderness to palp.  +cremasteric reflexes bilat.  No inguinal LAN or palpable masses.; Extremities: Pulses normal, No tenderness, No edema, No calf edema or asymmetry.; Neuro: AA&Ox3, Major CN grossly intact.  Speech clear. No gross focal motor or sensory deficits in extremities. Climbs on and off stretcher easily by  himself. Gait steady.; Skin: Color normal, Warm, Dry.    ED Course  Procedures    EKG Interpretation None      MDM  MDM Reviewed: previous chart, nursing note and vitals Interpretation:  ultrasound   Results for orders placed during the hospital encounter of 10/11/13  POCT URINALYSIS DIP (DEVICE)      Result Value Ref Range   Glucose, UA NEGATIVE  NEGATIVE mg/dL   Bilirubin Urine NEGATIVE  NEGATIVE   Ketones, ur NEGATIVE  NEGATIVE mg/dL   Specific Gravity, Urine >=1.030  1.005 - 1.030   Hgb urine dipstick TRACE (*) NEGATIVE   pH 5.0  5.0 - 8.0   Protein, ur NEGATIVE  NEGATIVE mg/dL   Urobilinogen, UA 1.0  0.0 - 1.0 mg/dL   Nitrite NEGATIVE  NEGATIVE   Leukocytes, UA NEGATIVE  NEGATIVE   US Scrotum 10/11/2013   CLINICAL DATA:  Left testicular pain and swelling  EXAM: SCROTAL ULTRASOUND  DOPPLER ULTRASOUND OF THE TESTICLES  TECHNIQUE: Complete ultrasound examination of the testicles, epididymis, and other scrotal structures was performed. Color and spectral Doppler ultrasound were also utilized to evaluate blood flow to the testicles.  COMPARISON:  None  FINDINGS: Right testicle  Measurements: 5.0 x 2.7 x 4.2 cm. Normal echogenicity without mass or calcification. Internal blood flow present on color Doppler imaging.  Left testicle  Measurements: 4.2 x 2.9 x 3.4 cm. Normal echogenicity without mass or calcification. Internal blood flow present on color Doppler imaging.  Right epididymis: Tiny cyst within right epididymis at head 2 x 2 x 3 mm, otherwise normal.  Left epididymis:  Normal in size and appearance.  Hydrocele:  Small left hydrocele.  No significant right hydrocele.  Varicocele:  Absent bilaterally  Pulsed Doppler interrogation of both testes demonstrates low resistance arterial and venous waveforms bilaterally.  IMPRESSION: Small left hydrocele.  No evidence of testicular mass or torsion.   Electronically Signed   By: Ulyses Southward M.D.   On: 10/11/2013 21:46   [name] Art/ven Flow Abd Pelv Doppler 10/11/2013   CLINICAL DATA:  Left testicular pain and swelling  EXAM: SCROTAL ULTRASOUND  DOPPLER ULTRASOUND OF THE TESTICLES  TECHNIQUE: Complete ultrasound examination of the  testicles, epididymis, and other scrotal structures was performed. Color and spectral Doppler ultrasound were also utilized to evaluate blood flow to the testicles.  COMPARISON:  None  FINDINGS: Right testicle  Measurements: 5.0 x 2.7 x 4.2 cm. Normal echogenicity without mass or calcification. Internal blood flow present on color Doppler imaging.  Left testicle  Measurements: 4.2 x 2.9 x 3.4 cm. Normal echogenicity without mass or calcification. Internal blood flow present on color Doppler imaging.  Right epididymis: Tiny cyst within right epididymis at head 2 x 2 x 3 mm, otherwise normal.  Left epididymis:  Normal in size and appearance.  Hydrocele:  Small left hydrocele.  No significant right hydrocele.  Varicocele:  Absent bilaterally  Pulsed Doppler interrogation of both testes demonstrates low resistance arterial and venous waveforms bilaterally.  IMPRESSION: Small left hydrocele.  No evidence of testicular mass or torsion.   Electronically Signed   By: Ulyses Southward M.D.   On: 10/11/2013 21:46    2320:  Workup reassuring. Will tx symptomatically at this time, f/u with Uro MD.  Dx and testing d/w pt.  Questions answered.  Verb understanding, agreeable to d/c home with outpt f/u.     Laray Anger, DO 10/15/13 1210

## 2013-10-11 NOTE — Discharge Instructions (Signed)
°Emergency Department Resource Guide °1) Find a Doctor and Pay Out of Pocket °Although you won't have to find out who is covered by your insurance plan, it is a good idea to ask around and get recommendations. You will then need to call the office and see if the doctor you have chosen will accept you as a new patient and what types of options they offer for patients who are self-pay. Some doctors offer discounts or will set up payment plans for their patients who do not have insurance, but you will need to ask so you aren't surprised when you get to your appointment. ° °2) Contact Your Local Health Department °Not all health departments have doctors that can see patients for sick visits, but many do, so it is worth a call to see if yours does. If you don't know where your local health department is, you can check in your phone book. The CDC also has a tool to help you locate your state's health department, and many state websites also have listings of all of their local health departments. ° °3) Find a Walk-in Clinic °If your illness is not likely to be very severe or complicated, you may want to try a walk in clinic. These are popping up all over the country in pharmacies, drugstores, and shopping centers. They're usually staffed by nurse practitioners or physician assistants that have been trained to treat common illnesses and complaints. They're usually fairly quick and inexpensive. However, if you have serious medical issues or chronic medical problems, these are probably not your best option. ° °No Primary Care Doctor: °- Call Health Connect at  832-8000 - they can help you locate a primary care doctor that  accepts your insurance, provides certain services, etc. °- Physician Referral Service- 1-800-533-3463 ° °Chronic Pain Problems: °Organization         Address  Phone   Notes  °[facility]  (336) 297-2271 Patients need to be referred by their primary care doctor.  ° °Medication  Assistance: °Organization         Address  Phone   Notes  °[place] [facility] [address] °[place], [place] [address] (336) 641-8030 --Must be a resident of [place] °-- Must have NO insurance coverage whatsoever (no Medicaid/ Medicare, etc.) °-- The pt. MUST have a primary care doctor that directs their care regularly and follows them in the community °  °[facility]  (866) 331-1348   °[employer]  (888) 892-1162   ° °Agencies that provide inexpensive medical care: °Organization         Address  Phone   Notes  °[facility]  (336) 832-8035   °[facility]    (336) 832-7272   °[facility] [address] °[place], [place] [address] (336) 832-4777   °[facility] [address], °[place] (336) 271-4999   °[facility]    (336) 373-0678   °[facility]    (336) 272-1050   °[facility] ° [address], [place] Phone:  (336) 832-4444, Fax:  (336) 832-4440 Hours of Operation:  9 am - 6 pm, M-F.  Also accepts Medicaid/Medicare and self-pay.  °[facility] ° [address], [place] Phone: (336) 832-3150, Fax: (336) [phone]. Hours of Operation:  8:30 am - 5:30 pm, M-F.  Also accepts Medicaid and self-pay.  °[facility] [place] [address], [place] Phone: (336) 878-6027   °[facility] [address], [place], [place] (336)723-1848, Ext. 123 Mondays & Thursdays: 7-9 AM.  First 15 patients are seen on a first come, first serve basis. °  ° °Medicaid-accepting [place] Providers: ° °Organization         Address  Phone   Notes  °[facility] [address], [place] (336) 641-2100 Also accepts self-pay patients.  °[facility] [address], [place] ° (336) 856-9996   °[facility] [address], [place]  (336) 288-8857   °[facility] [address] [place] [address], [place] (336) 299-7000   °Veita Bland [address], [place]  ° (336) 373-1557 Only accepts [place] Access Medicaid patients after they have their name applied to their card.  ° °Self-Pay (no insurance) in [place]: ° °Organization         Address  Phone   Notes  °Sickle Cell Patients, [facility] [address], [place] (336) 832-1970   °[facility] [address], [place] (336) 832-4400   °[facility] ° [address], [place] (336) 992-4800   °[facility]/Dr. Osei-Bonsu ° [address] [place] [address], [place] or [address], [place] (336) 841-8500 Phone number for both [place] and [place] locations is the same.  °[facility] [address], [place] (336) 299-0000   °[facility] [address] [place] [address], [place] or [address] (336) 852-7530 °(336) 878-2260   °[facility] [address], [place] (336) 350-1642, phone; (336) 294-5005, fax Sees patients 1st and 3rd Saturday of every month.  Must not qualify for public or private insurance (i.e. Medicaid, Medicare, [place] Health Choice, Veterans' Benefits) • Household income should be no more than 200% of the poverty level •The clinic cannot treat you if you are pregnant or think you are pregnant • Sexually transmitted diseases are not treated at the clinic.  ° ° °Dental Care: °Organization         Address  Phone  Notes  °[place] [facility] [address], [place] (336) 641-6152 Accepts children up to age 21 who are enrolled in Medicaid or [place] Health Choice; pregnant women with a Medicaid card; and children who have applied for Medicaid or [place] Health Choice, but were declined, whose parents can pay a reduced fee at time of service.  °[place]  [facility] [place]  [address], [place] (336) 641-7733 Accepts children up to age 21 who are enrolled in Medicaid or [place] Health Choice; pregnant women with a Medicaid card; and children who have applied for Medicaid or [place] Health Choice, but were declined, whose parents can pay a reduced fee at time of service.  °[facility] ° [address], [place] (336) 641-4533 Patients are seen by appointment only. Walk-ins are not accepted. [facility] will see patients 18 years of age and older. °Monday - Tuesday (8am-5pm) °Most Wednesdays (8:30-5pm) °$30 per visit, cash only  °[facility] ° [address], [place] (336) 641-4533 Patients are seen by appointment only. Walk-ins are not accepted. [facility] will see patients 18 years of age and older. °One   Wednesday Evening (Monthly: Volunteer Based).  $30 per visit, cash only  °[facility]  (919) 537-3737 for adults; Children under age 4, call Graduate Pediatric Dentistry at (919) [phone]. Children aged 4-14, please call (919) [phone] to request a pediatric application. ° Dental services are provided in all areas of dental care including fillings, crowns and bridges, complete and partial dentures, implants, gum treatment, root canals, and extractions. Preventive care is also provided. Treatment is provided to both adults and children. °Patients are selected via a lottery and there is often a waiting list. °  °[facility] [address], °[place] ° (336) 763-8833 [email or web address] °  °[facility] [address], [place], [place] (336)723-1848, Ext. 123 Second and Fourth Thursday of each month, opens at 6:30 AM; Clinic ends at 9 AM.  Patients are seen on a first-come first-served basis, and a limited number are seen during each clinic.  ° °[facility] ° [address], [place], [place] (336) 723-7904    Eligibility Requirements °You must have lived in [place], [place], or [place] counties for at least the last three months. °  You cannot be eligible for state or federal sponsored healthcare insurance, including Veterans Administration, Medicaid, or Medicare. °  You generally cannot be eligible for healthcare insurance through your employer.  °  How to apply: °Eligibility screenings are held every Tuesday and Wednesday afternoon from 1:00 pm until 4:00 pm. You do not need an appointment for the interview!  °[facility] [address], [place], [place] 336-631-2330   °[facility]  336-342-8273   °[facility]  336-703-3100   °[facility]  336-570-6415   ° °Behavioral Health Resources in the Community: °Intensive Outpatient Programs °Organization         Address  Phone  Notes  °[facility] [address], [place], [place] 336-878-6098   °[facility] [address], [place], [place] 336-832-9800   °[facility] [address], [place], [place] ° 336-882-2125   °[facility] [address],  °[place], [place] 1-800-853-5163 or 336-641-4981   °Substance Abuse Resources °Organization         Address  Phone  Notes  °[facility]  336-882-2125   °[facility]  336-784-9470   °[facility]  336-285-9073   °[facility]  336-845-3988   °Residential & Outpatient Substance Abuse Program  1-800-659-3381   °Psychological Services °Organization         Address  Phone  Notes  °[facility]  336- 832-9600   °[facility]  336- 378-7881   °[facility] [address], [place] 1-800-853-5163 or 336-641-4981   ° °Mobile Crisis Teams °Organization         Address  Phone  Notes  °[facility], Mobile Crisis Care Unit  1-877-626-1772   °Assertive °[facility] ° [address]  [place], [place] 336-834-9664   °Sharon DeEsch [address] °[place] 336-554-5454   ° °Self-Help/Support Groups °Organization         Address  Phone             Notes  °[facility]oc[facility] of  - variety of support groups  336- 373-1402 Call for more information  °Narcotics Anonymous (NA), [facility] [address], °[place]  2 meetings at this location  ° °  Residential Treatment Programs Organization         Address  Phone  Notes  [facility] [address],    [place]  [phone]   [facility]  [address], [place] [phone], [place], [place] [phone]   [facility] [address], [place] [phone] Admissions: 8am-3pm M-F  [facility] [address]    [place], [place] [phone]   [facility] [address], [place], [place] [phone]   [facility] [address]  [place], [place] [phone]) [phone]   [facility] Intensive Outpatient [address], [place], [place] [phone] [phone]   [facility] ([facility] [address]  [place], [place] [phone]-(502) 731-2288 or [phone]   [facility] ([facility]) [address] [place], [place] [phone] Accepts Medicaid  [facility] [address]  [place] [phone] Substance Abuse/Addiction Treatment   [facility]         Address  Phone  Notes  [facility]  361-130-4531(888) (906)[phone]   Angie FavaJulie Brannon, PhD [address], [address] [place], [place]   ([phone](336) [phone] or (952) [phone](336) [phone]   [facility]    [address] [place], [place] [phone](336) [phone]   [facility] [address], [place], [place] ([phone](336) [phone] Insurance/Medicaid/sponsorship through [facility]  [facility] [address]                                    [place], [place] [phone]) [phone] Therapy/tele-psych/case    [facility] [address]   [place], [place] [phone](336) [phone]    Dr. Lolly MustacheArfeen  [phone]) [phone]   [facility]  [employer] [facility] 1) [address], [place] 2) [address], [place] 3)  [address], [place] [phone](336) [phone]) [phone]  [phone](336) [phone]   [facility] Child Abuse Hot[facility] (715)[phone](336) 8158164278 or [phone](336) [phone] (After Hours)       Take the prescriptions as directed.  Call your regular Urologist tomorrow to schedule a follow up appointment this week.  Return to the Emergency Department immediately sooner if worsening.

## 2013-10-16 ENCOUNTER — Encounter (HOSPITAL_COMMUNITY): Payer: Self-pay | Admitting: Emergency Medicine

## 2013-10-16 ENCOUNTER — Emergency Department (HOSPITAL_COMMUNITY)
Admission: EM | Admit: 2013-10-16 | Discharge: 2013-10-17 | Disposition: A | Discharge status: Home / Self Care | Payer: Self-pay | Attending: Emergency Medicine | Admitting: Emergency Medicine

## 2013-10-16 DIAGNOSIS — N453 Epididymo-orchitis: Secondary | ICD-10-CM

## 2013-10-16 DIAGNOSIS — N50812 Left testicular pain: Secondary | ICD-10-CM

## 2013-10-16 DIAGNOSIS — Z87442 Personal history of urinary calculi: Secondary | ICD-10-CM | POA: Insufficient documentation

## 2013-10-16 DIAGNOSIS — F17200 Nicotine dependence, unspecified, uncomplicated: Secondary | ICD-10-CM | POA: Insufficient documentation

## 2013-10-16 LAB — CBG MONITORING, ED: Glucose-Capillary: 122 mg/dL — ABNORMAL HIGH (ref 70–99)

## 2013-10-16 MED ORDER — MORPHINE SULFATE 4 MG/ML IJ SOLN
6.0000 mg | Freq: Once | INTRAMUSCULAR | Status: AC
  Administered 2013-10-16: 6 mg via INTRAVENOUS
  Filled 2013-10-16: qty 2

## 2013-10-16 MED ORDER — CEFTRIAXONE SODIUM 250 MG IJ SOLR
250.0000 mg | Freq: Once | INTRAMUSCULAR | Status: AC
  Administered 2013-10-16: 250 mg via INTRAMUSCULAR
  Filled 2013-10-16: qty 250

## 2013-10-16 MED ORDER — AZITHROMYCIN 250 MG PO TABS
1000.0000 mg | ORAL_TABLET | Freq: Once | ORAL | Status: AC
  Administered 2013-10-16: 1000 mg via ORAL
  Filled 2013-10-16: qty 4

## 2013-10-16 MED ORDER — ONDANSETRON HCL 4 MG/2ML IJ SOLN
4.0000 mg | Freq: Once | INTRAMUSCULAR | Status: AC
  Administered 2013-10-16: 4 mg via INTRAVENOUS
  Filled 2013-10-16: qty 2

## 2013-10-16 NOTE — ED Notes (Signed)
Pt is c/o intense pain in his left lower back as well as left scrotum was seen here for the same.  Pt has appointment w/ urology on Tuesday however presents today d/t uncontrolled pain.  Pt denies pain w/ urination or feeling as if he has not completely emptied his bladder.  Pt states pain is better when he stands.

## 2013-10-16 NOTE — ED Provider Notes (Signed)
CSN: [id number]     Arrival date & time 10/16/13  2041 History   First MD Initiated Contact with Patient 10/16/13 2233     Chief Complaint  Patient presents with  . Testicle Pain     (Consider location/radiation/quality/duration/timing/severity/associated sxs/prior Treatment) HPI  28 year old male with prior history of kidney stones presents complaining of testicular pain. Patient reports about 8 days ago he woke up and noticed some mild tenderness to his left testicle. He waited for several days but the pain has never fully resolved he noticed increased testicular pain and swelling. He went to urgent care and was sent to ER for further evaluation. He was evaluated in the ED on March 16 which is 5 days ago. At that time he had a testicular and scrotal ultrasound. Findings demonstrate a small hydrocele but no acute finding. No evidence of testicular torsion. He was discharge with Vicodin and naproxen and recommended to be seen by urologist. He has an appointment set for next Tuesday which is 3 days from now. Today he states that his pain is increased with occasional sharp shooting pain to his left abdomen and to his left low back. Pain is worsened with walking and with sitting and improves when he stands.  He checked WebMD and felt he needed to return to ER for re-evaluation.  Pt felt that he needed antibiotic.  He denies increase swelling. Denies any fever, chills, chest pain, shortness of breath, lightheadedness, nausea vomiting or diarrhea, dysuria, hematuria, hematochezia or melena. States he has sex with one partner using protection every single time and no prior history of STD.  Past Medical History  Diagnosis Date  . Kidney stone 2004   History reviewed. No pertinent past surgical history. History reviewed. No pertinent family history. History  Substance Use Topics  . Smoking status: Light Tobacco Smoker  . Smokeless tobacco: Not on file  . Alcohol Use: Yes     Comment: socially     Review of Systems  Constitutional: Negative for fever.  Genitourinary: Positive for scrotal swelling and testicular pain. Negative for dysuria, urgency, hematuria, flank pain, decreased urine volume, discharge, penile swelling and penile pain.  Musculoskeletal: Negative for back pain.  Skin: Negative for rash and wound.  Neurological: Negative for numbness.      Allergies  Review of patient's allergies indicates no known allergies.  Home Medications   Current Outpatient Rx  Name  Route  Sig  Dispense  Refill  . HYDROcodone-acetaminophen (NORCO/VICODIN) 5-325 MG per tablet      1 or 2 tabs PO q6 hours prn pain   20 tablet   0   . naproxen sodium (ANAPROX) 220 MG tablet   Oral   Take 660 mg by mouth every 4 (four) hours as needed (pain).          BP 123/76  Pulse 96  Temp(Src) 98.2 F (36.8 C) (Oral)  Resp 22  SpO2 96% Physical Exam  Constitutional: He appears well-developed and well-nourished. No distress.  HENT:  Head: Atraumatic.  Eyes: Conjunctivae are normal.  Neck: Normal range of motion. Neck supple.  Abdominal:  No cva tenderness. No abdominal tenderness.    Genitourinary:  Patient has a circumcised penis, normal right testicle without any significant tenderness, left testicle is moderately tender with normal lie. moderate scrotal swelling, with firmness.  Difficult to assess for inguinal hernia due to pain.  Normal skin appearance, no rash.  Cremasteric reflex intact.  Neurological: He is alert.  Skin:  No rash noted.  Psychiatric: He has a normal mood and affect.    ED Course  Procedures (including critical care time)  12:23 AM Patient here with persistent left testicle swelling and pain. Pain swelling has not changed. No systemic manifestation include no fever no nausea vomiting diarrhea. No dysuria. He has had testicular and scrotal US 6 days ago which demonstrate a small left hydrocele only without evidence of testicular torsion.  Given the  duration of his pain and no changes in swelling or intensity, will treat for suspect orchitis with rocephin/zithromax and will discharge with doxycycline and pain medication.  Pt is scheduled to follow up with urologist in 3 days.  Strict return precaution discussed.  Care discussed with Dr. Romeo AppleHarrison.  Pt comfortable with plan.  i also discuss option of re-imaging with US but felt if sxs worsen, pt will return and can have reimaging.  Do not think pt has intermittent torsion.  No urinary complaint.    Labs Review Labs Reviewed  CBG MONITORING, ED - Abnormal; Notable for the following:    Glucose-Capillary 122 (*)    All other components within normal limits   Imaging Review No results found.   EKG Interpretation None      MDM   Final diagnoses:  Orchitis and epididymitis  Pain in left testicle    BP 123/76  Pulse 96  Temp(Src) 98.2 F (36.8 C) (Oral)  Resp 22  SpO2 96%  I have reviewed nursing notes and vital signs. I personally reviewed the imaging tests through PACS system  I reviewed available ER/hospitalization records thought the EMR     Fayrene HelperBowie Leonore Frankson, [place] JerseyPA-C 10/17/13 [id number]

## 2013-10-16 NOTE — ED Notes (Signed)
Pt arrived to the Ed with a complaint of testicle pain.  Pt seen a couple of days ago in the system sent home with pain meds and a follow up.  Pt states today pain has increased and is shooting up his pelvic area specifically on the left side.

## 2013-10-16 NOTE — ED Notes (Signed)
Pt became diaphoretic and pale stating he needed to lie down.  BP 117/54 o2 92%.  Will notify MD.

## 2013-10-17 ENCOUNTER — Encounter (HOSPITAL_COMMUNITY): Payer: Self-pay | Admitting: Emergency Medicine

## 2013-10-17 ENCOUNTER — Emergency Department (HOSPITAL_COMMUNITY): Payer: Self-pay

## 2013-10-17 ENCOUNTER — Emergency Department (HOSPITAL_COMMUNITY)
Admission: EM | Admit: 2013-10-17 | Discharge: 2013-10-17 | Disposition: A | Discharge status: Home / Self Care | Payer: Self-pay | Attending: Emergency Medicine | Admitting: Emergency Medicine

## 2013-10-17 DIAGNOSIS — F17200 Nicotine dependence, unspecified, uncomplicated: Secondary | ICD-10-CM | POA: Insufficient documentation

## 2013-10-17 DIAGNOSIS — N451 Epididymitis: Secondary | ICD-10-CM

## 2013-10-17 DIAGNOSIS — R609 Edema, unspecified: Secondary | ICD-10-CM | POA: Insufficient documentation

## 2013-10-17 DIAGNOSIS — N453 Epididymo-orchitis: Secondary | ICD-10-CM | POA: Insufficient documentation

## 2013-10-17 DIAGNOSIS — N433 Hydrocele, unspecified: Secondary | ICD-10-CM | POA: Insufficient documentation

## 2013-10-17 DIAGNOSIS — Z87442 Personal history of urinary calculi: Secondary | ICD-10-CM | POA: Insufficient documentation

## 2013-10-17 MED ORDER — DOXYCYCLINE HYCLATE 100 MG PO CAPS: 100.0000 mg | ORAL_CAPSULE | Freq: Two times a day (BID) | ORAL | Status: DC

## 2013-10-17 MED ORDER — OXYCODONE-ACETAMINOPHEN 5-325 MG PO TABS: 1.0000 | ORAL_TABLET | Freq: Four times a day (QID) | ORAL | Status: DC | PRN

## 2013-10-17 NOTE — ED Notes (Signed)
PA at bedside.

## 2013-10-17 NOTE — ED Notes (Signed)
Pt presents to department for evaluation of L sided testicular pain. Ongoing x1 week. Was seen yesterday for same, told to return today for repeat ultrasound. 7/10 pain at the time. Pt is alert and oriented x4.

## 2013-10-17 NOTE — Discharge Instructions (Signed)
Please follow up closely with urology on Tuesday.  Take antibiotic and pain medication as prescribed.  Return promptly if your pain worsen or if you have any concerns.   Orchitis Orchitis is an infection of the testicle of usually sudden onset (happens quickly). It may be viral or bacterial (caused by germs). Usually with this illness there is generalized malaise (not feeling well) and fever. There is also pain. There is usually tenderness and swelling of the scrotum and testicle. DIAGNOSIS  Your caregiver will perform an exam to make sure there is not another reason for the pain in your testicle. A rectal exam may be done to find out if the prostate is swollen and tender. Blood work may be done to see if your white blood cell count is elevated. This can help determine if an infection is viral or bacterial. A urinalysis can also determine what type of infection is present. Most bacterial infections can be treated with antibiotics (medications which kill germs). LET YOUR CAREGIVER KNOW ABOUT:  Allergies.  Medications taken including herbs, eye drops, over the counter medications, and creams.  Use of steroids (by mouth or creams).  Previous problems with anesthetics or novocaine.  Previous prostate infections.  History of blood clots (thrombophlebitis).  History of bleeding or blood problems.  Previous surgery.  Previous urinary tract infection.  Other health problems. HOME CARE INSTRUCTIONS   Apply cold packs to the scrotal area for twenty minutes, four times per day or as needed.  A scrotal support may be helpful. Keep a small pillow or support under your testicles while lying or sitting down.  Only take over-the-counter or prescription medicines for pain, discomfort, or fever as directed by your caregiver.  Take all medications, including antibiotics, as directed. Take the antibiotics for the full prescribed length of time even if you are feeling better. SEEK IMMEDIATE MEDICAL  CARE IF:   Your redness, swelling, or pain in the testicle increases or is not getting better.  You have a fever.  You have pain not relieved with medicines.  You have any worsening of any symptoms (problems) that originally brought you in for medical care. Document Released: 07/12/2000 Document Revised: 10/07/2011 Document Reviewed: 07/15/2005 [facility] Patient Information 2014 [place] [place].

## 2013-10-17 NOTE — Discharge Instructions (Signed)
Please read and follow all provided instructions.  Your diagnoses today include:  1. Epididymitis     Tests performed today include:  Ultrasound showing epididymitis  Vital signs. See below for your results today.   Medications prescribed:   None  Take any prescribed medications only as directed.  Home care instructions:  Follow any educational materials contained in this packet.  Continue pain and antibiotic medication at home.   Follow-up instructions: Please follow-up with your primary care provider in the next 3 days for further evaluation of your symptoms. If you do not have a primary care doctor -- see below for referral information.   Return instructions:   Please return to the Emergency Department if you experience worsening symptoms.   Please return if you have any other emergent concerns.  Additional Information:  Your vital signs today were: BP 125/74   Pulse 104   Temp(Src) 98.7 F (37.1 C) (Oral)   Resp 18   SpO2 97% If your blood pressure (BP) was elevated above 135/85 this visit, please have this repeated by your doctor within one month. --------------

## 2013-10-17 NOTE — ED Provider Notes (Signed)
CSN: [id number]     Arrival date & time 10/17/13  1614 History   First MD Initiated Contact with Patient 10/17/13 1631     Chief Complaint  Patient presents with  . Testicle Pain     (Consider location/radiation/quality/duration/timing/severity/associated sxs/prior Treatment) HPI Comments: Patient returns for re-imaging of left testicle. Patient has had left testicular pain and swelling for the past one week. Patient was seen on 10/11/13 and had a ultrasound which showed a small hydrocele. Patient returned yesterday because of severe pain. At that point, patient was placed on antibiotics and discharged home. Followup with patient this morning by Dr. Romeo Apple by telephone. Patient was continuing to have pains so he was told to return for another ultrasound to rule out torsion. Patient denies fever, vomiting, dysuria, hematuria, urethral discharge. Patient has been taking Percocet and Vicodin for pain which helps somewhat. The onset of this condition was acute. The course is slight worsened. Aggravating factors: sitting. Alleviating factors: standing.    Patient is a 28 y.o. male presenting with testicular pain. The history is provided by the patient and medical records.  Testicle Pain Pertinent negatives include no abdominal pain, chest pain, coughing, fever, headaches, myalgias, nausea, rash, sore throat or vomiting.    Past Medical History  Diagnosis Date  . Kidney stone 2004   History reviewed. No pertinent past surgical history. No family history on file. History  Substance Use Topics  . Smoking status: Light Tobacco Smoker  . Smokeless tobacco: Not on file  . Alcohol Use: Yes     Comment: socially    Review of Systems  Constitutional: Negative for fever.  HENT: Negative for rhinorrhea and sore throat.   Eyes: Negative for redness.  Respiratory: Negative for cough.   Cardiovascular: Negative for chest pain.  Gastrointestinal: Negative for nausea, vomiting, abdominal pain and  diarrhea.  Genitourinary: Positive for scrotal swelling and testicular pain. Negative for dysuria, hematuria and penile swelling.  Musculoskeletal: Negative for myalgias.  Skin: Negative for rash.  Neurological: Negative for headaches.   Allergies  Review of patient's allergies indicates no known allergies.  Home Medications   Current Outpatient Rx  Name  Route  Sig  Dispense  Refill  . doxycycline (VIBRAMYCIN) 100 MG capsule   Oral   Take 1 capsule (100 mg total) by mouth 2 (two) times daily.   28 capsule   0   . HYDROcodone-acetaminophen (NORCO/VICODIN) 5-325 MG per tablet      1 or 2 tabs PO q6 hours prn pain   20 tablet   0   . naproxen sodium (ANAPROX) 220 MG tablet   Oral   Take 660 mg by mouth every 4 (four) hours as needed (pain).         [name] oxyCODONE-acetaminophen (PERCOCET) 5-325 MG per tablet   Oral   Take 1 tablet by mouth every 6 (six) hours as needed for severe pain.   20 tablet   0    BP 125/74  Pulse 104  Temp(Src) 98.7 F (37.1 C) (Oral)  Resp 18  SpO2 97% Physical Exam  Nursing note and vitals reviewed. Constitutional: He appears well-developed and well-nourished.  HENT:  Head: Normocephalic and atraumatic.  Eyes: Conjunctivae are normal.  Neck: Normal range of motion. Neck supple.  Pulmonary/Chest: No respiratory distress.  Genitourinary: Right testis shows no mass and no swelling. Right testis is descended. Left testis shows swelling. Left testis is descended. Circumcised.  Moderate hydrocele, tenderness, no overlying erythema or warmth.  Neurological: He is alert.  Skin: Skin is warm and dry.  Psychiatric: He has a normal mood and affect.    ED Course  Procedures (including critical care time) Labs Review Labs Reviewed  GC/CHLAMYDIA PROBE AMP   Imaging Review [place] Scrotum  10/17/2013   CLINICAL DATA:  28 year old male with left scrotal pain.  EXAM: SCROTAL ULTRASOUND  DOPPLER ULTRASOUND OF THE TESTICLES  TECHNIQUE: Complete  ultrasound examination of the testicles, epididymis, and other scrotal structures was performed. Color and spectral Doppler ultrasound were also utilized to evaluate blood flow to the testicles.  COMPARISON:  None.  FINDINGS: Right testicle  Measurements: 5.1 x 2 x 3.6 cm. No mass or microlithiasis visualized.  Left testicle  Measurements: 4.8 x 3 x 3.7 cm. No mass or microlithiasis visualized.  Right epididymis:  Normal in size and appearance.  Left epididymis:  Enlarged with increased flow.  Hydrocele: Bilateral hydroceles are noted, moderate on the left and small on the right.  Varicocele:  None visualized.  Pulsed Doppler interrogation of both testes demonstrates low resistance arterial and venous waveforms bilaterally.  IMPRESSION: Left epididymitis.  Normal testicles.  No evidence of testicular torsion.   Electronically Signed   By: Laveda AbbeJeff  Hu M.D.   On: 10/17/2013 18:57   [place] Art/ven Flow Abd Pelv Doppler  10/17/2013   CLINICAL DATA:  28 year old male with left scrotal pain.  EXAM: SCROTAL ULTRASOUND  DOPPLER ULTRASOUND OF THE TESTICLES  TECHNIQUE: Complete ultrasound examination of the testicles, epididymis, and other scrotal structures was performed. Color and spectral Doppler ultrasound were also utilized to evaluate blood flow to the testicles.  COMPARISON:  None.  FINDINGS: Right testicle  Measurements: 5.1 x 2 x 3.6 cm. No mass or microlithiasis visualized.  Left testicle  Measurements: 4.8 x 3 x 3.7 cm. No mass or microlithiasis visualized.  Right epididymis:  Normal in size and appearance.  Left epididymis:  Enlarged with increased flow.  Hydrocele: Bilateral hydroceles are noted, moderate on the left and small on the right.  Varicocele:  None visualized.  Pulsed Doppler interrogation of both testes demonstrates low resistance arterial and venous waveforms bilaterally.  IMPRESSION: Left epididymitis.  Normal testicles.  No evidence of testicular torsion.   Electronically Signed   By: Laveda AbbeJeff  Hu M.D.    On: 10/17/2013 18:57     EKG Interpretation None      4:51 PM Patient seen and examined. Spoke with Dr. Romeo AppleHarrison who is working today. US ordered.   Vital signs reviewed and are as follows: Filed Vitals:   10/17/13 1622  BP: 125/74  Pulse: 104  Temp: 98.7 F (37.1 C)  Resp: 18   7:15 PM US shows epididymitis. Patient informed. Reviewed treatment from other day. Patient treated appropriately. He will follow-up in 2 days as planned with urology.   He has narcotic pain medications at home. Told him not to combine Vicodin with Percocet. Told not to drive or perform dangerous activities of taking these medications.  Patient urged to return with worsening symptoms or other concerns. Patient verbalized understanding and agrees with plan.    MDM   Final diagnoses:  Epididymitis   Patient appears well. No torsion. No systemic sx of illness. He is on appropriate therapy.     Renne CriglerJoshua Desree Leap, PA-C 10/17/13 1918

## 2013-10-18 NOTE — ED Provider Notes (Signed)
Medical screening examination/treatment/procedure(s) were performed by non-physician practitioner and as supervising physician I was immediately available for consultation/collaboration.   EKG Interpretation None       Flint MelterElliott L Kourtni Stineman, MD 10/18/13 2049

## 2013-10-19 ENCOUNTER — Emergency Department (HOSPITAL_COMMUNITY)
Admission: EM | Admit: 2013-10-19 | Discharge: 2013-10-19 | Disposition: A | Discharge status: Home / Self Care | Payer: Self-pay | Attending: Emergency Medicine | Admitting: Emergency Medicine

## 2013-10-19 ENCOUNTER — Encounter (HOSPITAL_COMMUNITY): Payer: Self-pay | Admitting: Emergency Medicine

## 2013-10-19 DIAGNOSIS — F17200 Nicotine dependence, unspecified, uncomplicated: Secondary | ICD-10-CM | POA: Insufficient documentation

## 2013-10-19 DIAGNOSIS — N50812 Left testicular pain: Secondary | ICD-10-CM

## 2013-10-19 DIAGNOSIS — Z87442 Personal history of urinary calculi: Secondary | ICD-10-CM | POA: Insufficient documentation

## 2013-10-19 DIAGNOSIS — Z792 Long term (current) use of antibiotics: Secondary | ICD-10-CM | POA: Insufficient documentation

## 2013-10-19 DIAGNOSIS — N453 Epididymo-orchitis: Secondary | ICD-10-CM | POA: Insufficient documentation

## 2013-10-19 DIAGNOSIS — N451 Epididymitis: Secondary | ICD-10-CM

## 2013-10-19 LAB — URINALYSIS, ROUTINE W REFLEX MICROSCOPIC
Bilirubin Urine: NEGATIVE
Glucose, UA: NEGATIVE mg/dL
Hgb urine dipstick: NEGATIVE
Ketones, ur: NEGATIVE mg/dL
Leukocytes, UA: NEGATIVE
Nitrite: NEGATIVE
Protein, ur: NEGATIVE mg/dL
Specific Gravity, Urine: 1.028 (ref 1.005–1.030)
Urobilinogen, UA: 1 mg/dL (ref 0.0–1.0)
pH: 6 (ref 5.0–8.0)

## 2013-10-19 MED ORDER — SODIUM CHLORIDE 0.9 % IV BOLUS (SEPSIS)
1000.0000 mL | Freq: Once | INTRAVENOUS | Status: AC
  Administered 2013-10-19: 1000 mL via INTRAVENOUS

## 2013-10-19 MED ORDER — DEXTROSE 5 % IV SOLN
1.0000 g | Freq: Once | INTRAVENOUS | Status: AC
  Administered 2013-10-19: 1 g via INTRAVENOUS
  Filled 2013-10-19: qty 10

## 2013-10-19 MED ORDER — CEFTRIAXONE SODIUM 1 G IJ SOLR: 1.0000 g | Freq: Once | INTRAMUSCULAR | Status: DC

## 2013-10-19 MED ORDER — SULFAMETHOXAZOLE-TRIMETHOPRIM 800-160 MG PO TABS: 1.0000 | ORAL_TABLET | Freq: Three times a day (TID) | ORAL | Status: DC

## 2013-10-19 NOTE — ED Provider Notes (Signed)
CSN: [id number]     Arrival date & time 10/19/13  1504 History   First MD Initiated Contact with Patient 10/19/13 1605     Chief Complaint  Patient presents with  . Testicle Pain     (Consider location/radiation/quality/duration/timing/severity/associated sxs/prior Treatment) HPI Cory Little Is a 28 year old male who turns the emergency department for left testicle pain.  Patient has been seen here twice with diagnosis of orchitis, epididymitis and hydrocele.  He states that he did try to followup with the urology office today was asked to take $250 up front it was unable to afford.  Patient complains of continued severe pain in the left testicle, as continued swelling.  He was given 1 g of azithromycin and Rocephin and 10/11/2013.  He's been taking doxycycline daily as directed however Has not seen any improvement.  The patient states that he is having difficulty go to going to work because his pain is so bad and he cannot   take narcotic pain medications while working. He states his last sexual encounter was in January.  Review of the patient's chart shows no urinalysis was done.  Past Medical History  Diagnosis Date  . Kidney stone 2004   History reviewed. No pertinent past surgical history. No family history on file. History  Substance Use Topics  . Smoking status: Light Tobacco Smoker  . Smokeless tobacco: Not on file  . Alcohol Use: Yes     Comment: socially    Review of Systems  Ten systems reviewed and are negative for acute change, except as noted in the HPI.    Allergies  Review of patient's allergies indicates no known allergies.  Home Medications   Current Outpatient Rx  Name  Route  Sig  Dispense  Refill  . doxycycline (VIBRAMYCIN) 100 MG capsule   Oral   Take 100 mg by mouth 2 (two) times daily.         [name]. HYDROcodone-acetaminophen (NORCO/VICODIN) 5-325 MG per tablet   Oral   Take 1-2 tablets by mouth every 6 (six) hours as needed for moderate  pain.         [name]. ibuprofen (ADVIL,MOTRIN) 200 MG tablet   Oral   Take 800 mg by mouth every 6 (six) hours as needed for moderate pain.         . naproxen sodium (ANAPROX) 220 MG tablet   Oral   Take 660 mg by mouth every 4 (four) hours as needed (pain).         [name]. oxyCODONE-acetaminophen (PERCOCET) 5-325 MG per tablet   Oral   Take 1 tablet by mouth every 6 (six) hours as needed for severe pain.   20 tablet   0    BP 146/89  Pulse 103  Temp(Src) 98.7 F (37.1 C)  Resp 16  SpO2 97% Physical Exam  ED Course  Procedures (including critical care time) Labs Review Labs Reviewed - No data to display Imaging Review [place] Scrotum  10/17/2013   CLINICAL DATA:  28 year old male with left scrotal pain.  EXAM: SCROTAL ULTRASOUND  DOPPLER ULTRASOUND OF THE TESTICLES  TECHNIQUE: Complete ultrasound examination of the testicles, epididymis, and other scrotal structures was performed. Color and spectral Doppler ultrasound were also utilized to evaluate blood flow to the testicles.  COMPARISON:  None.  FINDINGS: Right testicle  Measurements: 5.1 x 2 x 3.6 cm. No mass or microlithiasis visualized.  Left testicle  Measurements: 4.8 x 3 x 3.7 cm. No mass or microlithiasis visualized.  Right epididymis:  Normal in size and appearance.  Left epididymis:  Enlarged with increased flow.  Hydrocele: Bilateral hydroceles are noted, moderate on the left and small on the right.  Varicocele:  None visualized.  Pulsed Doppler interrogation of both testes demonstrates low resistance arterial and venous waveforms bilaterally.  IMPRESSION: Left epididymitis.  Normal testicles.  No evidence of testicular torsion.   Electronically Signed   By: Laveda Abbe M.D.   On: 10/17/2013 18:57   [name] Art/ven Flow Abd Pelv Doppler  10/17/2013   CLINICAL DATA:  28 year old male with left scrotal pain.  EXAM: SCROTAL ULTRASOUND  DOPPLER ULTRASOUND OF THE TESTICLES  TECHNIQUE: Complete ultrasound examination of the testicles,  epididymis, and other scrotal structures was performed. Color and spectral Doppler ultrasound were also utilized to evaluate blood flow to the testicles.  COMPARISON:  None.  FINDINGS: Right testicle  Measurements: 5.1 x 2 x 3.6 cm. No mass or microlithiasis visualized.  Left testicle  Measurements: 4.8 x 3 x 3.7 cm. No mass or microlithiasis visualized.  Right epididymis:  Normal in size and appearance.  Left epididymis:  Enlarged with increased flow.  Hydrocele: Bilateral hydroceles are noted, moderate on the left and small on the right.  Varicocele:  None visualized.  Pulsed Doppler interrogation of both testes demonstrates low resistance arterial and venous waveforms bilaterally.  IMPRESSION: Left epididymitis.  Normal testicles.  No evidence of testicular torsion.   Electronically Signed   By: Laveda Abbe M.D.   On: 10/17/2013 18:57     EKG Interpretation None      MDM   Final diagnoses:  None   6:33 PM BP 146/89  Pulse 103  Temp(Src) 98.7 F (37.1 C)  Resp 16  SpO2 97% Spoken with Dr. Bayard Males of urology about outpatient dilemma.  He recommended that we repeat the patient with IV antibiotics.  Him and switch him to Bactrim DS twice a day for 14 days which has better penetration to the testicle.  Patient's workup is pending   Patien was negative urinalysis.  I spoken to the patient about switching him to Bactrim.  Patient states he is in agreement with the plan.  He will take his narcotic pain medication as needed but she has been reluctant to do.  The patient appears to be stable for discharge at this time.  Discussed return precautions.  I encouraged him to find a way to follow up with  Urologist.   Arthor Captain, PA-C 10/21/13 1233

## 2013-10-19 NOTE — Discharge Instructions (Signed)
Epididymitis Epididymitis is a swelling (inflammation) of the epididymis. The epididymis is a cord-like structure along the back part of the testicle. Epididymitis is usually, but not always, caused by infection. This is usually a sudden problem beginning with chills, fever and pain behind the scrotum and in the testicle. There may be swelling and redness of the testicle. DIAGNOSIS  Physical examination will reveal a tender, swollen epididymis. Sometimes, cultures are obtained from the urine or from prostate secretions to help find out if there is an infection or if the cause is a different problem. Sometimes, blood work is performed to see if your white blood cell count is elevated and if a germ (bacterial) or viral infection is present. Using this knowledge, an appropriate medicine which kills germs (antibiotic) can be chosen by your caregiver. A viral infection causing epididymitis will most often go away (resolve) without treatment. HOME CARE INSTRUCTIONS   Hot sitz baths for 20 minutes, 4 times per day, may help relieve pain.  Only take over-the-counter or prescription medicines for pain, discomfort or fever as directed by your caregiver.  Take all medicines, including antibiotics, as directed. Take the antibiotics for the full prescribed length of time even if you are feeling better.  It is very important to keep all follow-up appointments. SEEK IMMEDIATE MEDICAL CARE IF:   You have a fever.  You have pain not relieved with medicines.  You have any worsening of your problems.  Your pain seems to come and go.  You develop pain, redness, and swelling in the scrotum and surrounding areas. MAKE SURE YOU:   Understand these instructions.  Will watch your condition.  Will get help right away if you are not doing well or get worse. Document Released: 07/12/2000 Document Revised: 10/07/2011 Document Reviewed: 06/01/2009 [facility] Patient Information 2014 [place],  [place].  Orchitis Orchitis is an infection of the testicle of usually sudden onset (happens quickly). It may be viral or bacterial (caused by germs). Usually with this illness there is generalized malaise (not feeling well) and fever. There is also pain. There is usually tenderness and swelling of the scrotum and testicle. DIAGNOSIS  Your caregiver will perform an exam to make sure there is not another reason for the pain in your testicle. A rectal exam may be done to find out if the prostate is swollen and tender. Blood work may be done to see if your white blood cell count is elevated. This can help determine if an infection is viral or bacterial. A urinalysis can also determine what type of infection is present. Most bacterial infections can be treated with antibiotics (medications which kill germs). LET YOUR CAREGIVER KNOW ABOUT:  Allergies.  Medications taken including herbs, eye drops, over the counter medications, and creams.  Use of steroids (by mouth or creams).  Previous problems with anesthetics or novocaine.  Previous prostate infections.  History of blood clots (thrombophlebitis).  History of bleeding or blood problems.  Previous surgery.  Previous urinary tract infection.  Other health problems. HOME CARE INSTRUCTIONS   Apply cold packs to the scrotal area for twenty minutes, four times per day or as needed.  A scrotal support may be helpful. Keep a small pillow or support under your testicles while lying or sitting down.  Only take over-the-counter or prescription medicines for pain, discomfort, or fever as directed by your caregiver.  Take all medications, including antibiotics, as directed. Take the antibiotics for the full prescribed length of time even if you are feeling better.  SEEK IMMEDIATE MEDICAL CARE IF:   Your redness, swelling, or pain in the testicle increases or is not getting better.  You have a fever.  You have pain not relieved with  medicines.  You have any worsening of any symptoms (problems) that originally brought you in for medical care. Document Released: 07/12/2000 Document Revised: 10/07/2011 Document Reviewed: 07/15/2005 [facility] Patient Information 2014 [place], [place]. Sulfamethoxazole; Trimethoprim, SMX-TMP tablets What is this medicine? SULFAMETHOXAZOLE; TRIMETHOPRIM or SMX-TMP (suhl fuh meth OK suh zohl; trye METH oh prim) is a combination of a sulfonamide antibiotic and a second antibiotic, trimethoprim. It is used to treat or prevent certain kinds of bacterial infections. It will not work for colds, flu, or other viral infections. This medicine may be used for other purposes; ask your health care provider or pharmacist if you have questions. COMMON BRAND NAME(S): Bacter-Aid DS , Bactrim DS, Bactrim, Septra DS, Septra What should I tell my health care provider before I take this medicine? They need to know if you have any of these conditions: -anemia -asthma -being treated with anticonvulsants -if you frequently drink alcohol containing drinks -kidney disease -liver disease -low level of folic acid or [id number] dehydrogenase -poor nutrition or malabsorption -porphyria -severe allergies -thyroid disorder -an unusual or allergic reaction to sulfamethoxazole, trimethoprim, sulfa drugs, other medicines, foods, dyes, or preservatives -pregnant or trying to get pregnant -breast-feeding How should I use this medicine? Take this medicine by mouth with a full glass of water. Follow the directions on the prescription label. Take your medicine at regular intervals. Do not take it more often than directed. Do not skip doses or stop your medicine early. Talk to your pediatrician regarding the use of this medicine in children. Special care may be needed. This medicine has been used in children as young as 109 months of age. Overdosage: If you think you have taken too much of this medicine contact a poison  control center or emergency room at once. NOTE: This medicine is only for you. Do not share this medicine with others. What if I miss a dose? If you miss a dose, take it as soon as you can. If it is almost time for your next dose, take only that dose. Do not take double or extra doses. What may interact with this medicine? Do not take this medicine with any of the following medications: -aminobenzoate potassium -dofetilide -metronidazole This medicine may also interact with the following medications: -ACE inhibitors like benazepril, enalapril, lisinopril, and ramipril -cyclosporine -digoxin -diuretics -indomethacin -medicines for diabetes -methenamine -methotrexate -phenytoin -potassium supplements -pyrimethamine -sulfinpyrazone -tricyclic antidepressants -warfarin This list may not describe all possible interactions. Give your health care provider a list of all the medicines, herbs, non-prescription drugs, or dietary supplements you use. Also tell them if you smoke, drink alcohol, or use illegal drugs. Some items may interact with your medicine. What should I watch for while using this medicine? Tell your doctor or health care professional if your symptoms do not improve. Drink several glasses of water a day to reduce the risk of kidney problems. Do not treat diarrhea with over the counter products. Contact your doctor if you have diarrhea that lasts more than 2 days or if it is severe and watery. This medicine can make you more sensitive to the sun. Keep out of the sun. If you cannot avoid being in the sun, wear protective clothing and use a sunscreen. Do not use sun lamps or tanning beds/booths. What side effects may I notice from  receiving this medicine? Side effects that you should report to your doctor or health care professional as soon as possible: -allergic reactions like skin rash or hives, swelling of the face, lips, or tongue -breathing problems -fever or chills, sore  throat -irregular heartbeat, chest pain -joint or muscle pain -pain or difficulty passing urine -red pinpoint spots on skin -redness, blistering, peeling or loosening of the skin, including inside the mouth -unusual bleeding or bruising -unusually weak or tired -yellowing of the eyes or skin Side effects that usually do not require medical attention (report to your doctor or health care professional if they continue or are bothersome): -diarrhea -dizziness -headache -loss of appetite -nausea, vomiting -nervousness This list may not describe all possible side effects. Call your doctor for medical advice about side effects. You may report side effects to FDA at 1-800-FDA-1088. Where should I keep my medicine? Keep out of the reach of children. Store at room temperature between 20 to 25 degrees C (68 to 77 degrees F). Protect from light. Throw away any unused medicine after the expiration date. NOTE: This sheet is a summary. It may not cover all possible information. If you have questions about this medicine, talk to your doctor, pharmacist, or health care provider.  2014, Elsevier/Gold Standard. (2008-03-16 11:32:51)  Varicocele A varicocele is a swelling of veins in the scrotum (the bag of skin that contains the testicles). It is most common in young men. It occurs most often on the left side. Small or painless varicoceles do not need treatment. Most often, this is not a serious problem, but further tests may be needed to confirm the diagnosis. Surgery may be needed if complications of varicoceles arise. Rarely, varicoceles can reoccur after surgery. CAUSES  The swelling is due to blood backing up in the vein that leads from the testicle back to the body. Blood backs up because the valves inside the vein are not working properly. Veins normally return blood to the heart. Valves in veins are supposed to be one-way valves. They should not allow blood to flow backwards. If the valves do not  work well, blood can pool in a vein and make it swell. The same thing happens with varicose veins in the leg. SYMPTOMS  A varicocele most often causes no symptoms. When they occur, symptoms include:   Swelling on one side of the scrotum.  Swelling that is more obvious when standing up.  A lumpy feeling in the scrotum.  Heaviness on one side of the scrotum.  Dull ache in the scrotum, especially after exercise or prolonged standing or sitting.  Slower growth or reduced size of the testicle on the side of the varicocele (in young males).  Problems with fertility can arise if the testicle does not grow normally. DIAGNOSIS  Varicocele is usually diagnosed by a physical exam. Sometimes ultrasonography is done. TREATMENT  Usually, varicoceles need no treatment. They are often routinely monitored on exam by your caregiver to ensure they do not slow the growth of the testicle on that side. Treatment may be needed if:  The varicocele is large.  There is a lot of pain.  The varicocele causes a decrease in the size of the testicle in a growing adolescent.  The other testicle is absent or not normal.  Varicoceles are found on both sides of the scrotum.  There is pain when exercising.  There are fertility problems. There are two types of treatment:  Surgery. The surgeon ties off the swollen veins. Surgery may  be done with an incision in the skin or through a laparoscope. The surgery is usually done in an outpatient setting. Outpatient means there is no overnight stay in a hospital.  Embolization. A small tube is placed in a vein and guided into the swollen veins. X-rays are used to guide the small tube. Tiny metal coils or other blocking items are put through the tube. This blocks swollen veins and the flow of blood. This is usually done in an outpatient setting without the use of general anesthesia. HOME CARE INSTRUCTIONS  To decrease discomfort:  Wear supportive underwear.  Use an  athletic supporter for sports.  Only take over-the-counter or prescription medicines for pain or discomfort as directed by your caregiver. SEEK MEDICAL CARE IF:   Pain is increasing.  Swelling does not decrease when lying down.  Testicle is smaller.  The testicle becomes enlarged, swollen, red, or painful. Document Released: 10/21/2000 Document Revised: 10/07/2011 Document Reviewed: 10/25/2009 [facility] Patient Information 2014 [place], [place].

## 2013-10-19 NOTE — ED Provider Notes (Signed)
Medical screening examination/treatment/procedure(s) were performed by non-physician practitioner and as supervising physician I was immediately available for consultation/collaboration.   EKG Interpretation None        Junius ArgyleForrest S Evyn Kooyman, MD 10/19/13 [phone]

## 2013-10-19 NOTE — ED Notes (Signed)
Patient with left testicular pain and swelling.  Taking doxycycline 500mg  twice daily.  Has taken 5 of the prescription without improvement.

## 2013-10-19 NOTE — ED Notes (Signed)
Pt diagnosed this weekend with orchitis; epididymytis and hydrocele; referred to urology clinic but they would not see him due to no insurance; continues with left testicle pain

## 2013-10-22 NOTE — ED Provider Notes (Signed)
Medical screening examination/treatment/procedure(s) were performed by non-physician practitioner and as supervising physician I was immediately available for consultation/collaboration.  Mashell Sieben T Katianne Barre, MD 10/22/13 0756 

## 2014-07-18 ENCOUNTER — Encounter (HOSPITAL_COMMUNITY): Payer: Self-pay | Admitting: Emergency Medicine

## 2014-07-18 ENCOUNTER — Emergency Department (INDEPENDENT_AMBULATORY_CARE_PROVIDER_SITE_OTHER)
Admission: EM | Admit: 2014-07-18 | Discharge: 2014-07-18 | Disposition: A | Discharge status: Home / Self Care | Payer: Self-pay | Source: Home / Self Care | Attending: Family Medicine | Admitting: Family Medicine

## 2014-07-18 DIAGNOSIS — L03115 Cellulitis of right lower limb: Secondary | ICD-10-CM

## 2014-07-18 MED ORDER — LIDOCAINE HCL (PF) 1 % IJ SOLN
INTRAMUSCULAR | Status: AC
  Filled 2014-07-18: qty 5

## 2014-07-18 MED ORDER — CEFTRIAXONE SODIUM 1 G IJ SOLR
1.0000 g | Freq: Once | INTRAMUSCULAR | Status: AC
  Administered 2014-07-18: 1 g via INTRAMUSCULAR

## 2014-07-18 MED ORDER — CEFTRIAXONE SODIUM 1 G IJ SOLR
INTRAMUSCULAR | Status: AC
  Filled 2014-07-18: qty 10

## 2014-07-18 MED ORDER — AMOXICILLIN-POT CLAVULANATE 875-125 MG PO TABS: 1.0000 | ORAL_TABLET | Freq: Two times a day (BID) | ORAL | Status: DC

## 2014-07-18 NOTE — ED Notes (Signed)
Pt states that he believes he was bite by a spider 3 days ago. A place on the right calf very red and swollen.

## 2014-07-18 NOTE — Discharge Instructions (Signed)

## 2014-07-18 NOTE — ED Provider Notes (Signed)
CSN: [id number]     Arrival date & time 07/18/14  1627 History   First MD Initiated Contact with Patient 07/18/14 1745     Chief Complaint  Patient presents with  . Insect Bite   (Consider location/radiation/quality/duration/timing/severity/associated sxs/prior Treatment) HPI Comments: 28 year old male who noticed area of soreness to the right posterior calf yesterday. Over the past 24 hours there has developed an area of cutaneous erythema with a central area of induration, pain and tenderness. The area measures approximately 7 x 6 cm of erythema with underlying induration 7 x 7 cm. There is no drainage. There is a single "entry point" that may initiated the infection. Patient is unaware of any type of bite or injury. He denies systemic symptoms.   Past Medical History  Diagnosis Date  . Kidney stone 2004   History reviewed. No pertinent past surgical history. History reviewed. No pertinent family history. History  Substance Use Topics  . Smoking status: Light Tobacco Smoker    Types: Cigarettes  . Smokeless tobacco: Not on file  . Alcohol Use: Yes     Comment: socially    Review of Systems  Constitutional: Negative.  Negative for fever.  Respiratory: Negative for cough and shortness of breath.   Cardiovascular: Negative for palpitations.  Gastrointestinal: Negative.   Skin: Positive for color change. Negative for rash.  Neurological: Negative.     Allergies  Review of patient's allergies indicates no known allergies.  Home Medications   Prior to Admission medications   Medication Sig Start Date End Date Taking? Authorizing Provider  amoxicillin-clavulanate (AUGMENTIN) 875-125 MG per tablet Take 1 tablet by mouth every 12 (twelve) hours. 07/18/14   Hayden Rasmussenavid Salik Grewell, NP  HYDROcodone-acetaminophen (NORCO/VICODIN) 5-325 MG per tablet Take 1-2 tablets by mouth every 6 (six) hours as needed for moderate pain. 10/11/13   Samuel JesterKathleen McManus, DO   BP 126/96 mmHg  Pulse 106  Temp(Src)  97.4 F (36.3 C) (Oral)  Resp 16  SpO2 96% Physical Exam  Constitutional: He is oriented to person, place, and time. He appears well-developed and well-nourished. No distress.  Pulmonary/Chest: Effort normal. No respiratory distress.  Musculoskeletal: He exhibits no edema.  Neurological: He is alert and oriented to person, place, and time. He exhibits normal muscle tone.  Skin: Skin is warm and dry.  Wound to the right posterior calf as in history of present illness. Annular area of erythema 7 x 6 cm. No drainage. No lymphangitis. No calf swelling.  Psychiatric: He has a normal mood and affect.  Nursing note and vitals reviewed.   ED Course  Procedures (including critical care time) Labs Review Labs Reviewed - No data to display  Imaging Review No results found.   MDM   1. Cellulitis of right leg    Ceftriaxone 1 g IM. Augmentin 875 one twice a day for 7 days Warm compresses several times a day. For any worsening new symptoms or problems such as increased size of the redness, red streaks, fever, increased swelling seek medical attention promptly. May return.    Hayden Rasmussenavid Nikelle Malatesta, NP 07/18/14 [phone]

## 2014-07-21 ENCOUNTER — Encounter (HOSPITAL_COMMUNITY): Payer: Self-pay | Admitting: *Deleted

## 2014-07-21 ENCOUNTER — Emergency Department (INDEPENDENT_AMBULATORY_CARE_PROVIDER_SITE_OTHER)
Admission: EM | Admit: 2014-07-21 | Discharge: 2014-07-21 | Disposition: A | Discharge status: Home / Self Care | Payer: Self-pay | Source: Home / Self Care | Attending: Family Medicine | Admitting: Family Medicine

## 2014-07-21 DIAGNOSIS — L03115 Cellulitis of right lower limb: Secondary | ICD-10-CM

## 2014-07-21 MED ORDER — CLINDAMYCIN HCL 300 MG PO CAPS: 300.0000 mg | ORAL_CAPSULE | Freq: Four times a day (QID) | ORAL | Status: DC

## 2014-07-21 NOTE — Discharge Instructions (Signed)
Discontinue taking Augmentin. Begin taking clindamycin as prescribed. Warm soaks and elevate as much as possible. Return in 2 days for re-check If condition worsens despite changes in therapy or you develop fever, please report to your nearest ER for evaluation.   Cellulitis Cellulitis is an infection of the skin and the tissue beneath it. The infected area is usually red and tender. Cellulitis occurs most often in the arms and lower legs.  CAUSES  Cellulitis is caused by bacteria that enter the skin through cracks or cuts in the skin. The most common types of bacteria that cause cellulitis are staphylococci and streptococci. SIGNS AND SYMPTOMS   Redness and warmth.  Swelling.  Tenderness or pain.  Fever. DIAGNOSIS  Your health care provider can usually determine what is wrong based on a physical exam. Blood tests may also be done. TREATMENT  Treatment usually involves taking an antibiotic medicine. HOME CARE INSTRUCTIONS   Take your antibiotic medicine as directed by your health care provider. Finish the antibiotic even if you start to feel better.  Keep the infected arm or leg elevated to reduce swelling.  Apply a warm cloth to the affected area up to 4 times per day to relieve pain.  Take medicines only as directed by your health care provider.  Keep all follow-up visits as directed by your health care provider. SEEK MEDICAL CARE IF:   You notice red streaks coming from the infected area.  Your red area gets larger or turns dark in color.  Your bone or joint underneath the infected area becomes painful after the skin has healed.  Your infection returns in the same area or another area.  You notice a swollen bump in the infected area.  You develop new symptoms.  You have a fever. SEEK IMMEDIATE MEDICAL CARE IF:   You feel very sleepy.  You develop vomiting or diarrhea.  You have a general ill feeling (malaise) with muscle aches and pains. MAKE SURE YOU:    Understand these instructions.  Will watch your condition.  Will get help right away if you are not doing well or get worse. Document Released: 04/24/2005 Document Revised: 11/29/2013 Document Reviewed: 09/30/2011 [facility] Patient Information 2015 [place]. This information is not intended to replace advice given to you by your health care provider. Make sure you discuss any questions you have with your health care provider.

## 2014-07-21 NOTE — ED Provider Notes (Signed)
CSN: [id number]     Arrival date & time 07/21/14  1506 History   First MD Initiated Contact with Patient 07/21/14 1513     Chief Complaint  Patient presents with  . Wound Check   (Consider location/radiation/quality/duration/timing/severity/associated sxs/prior Treatment) HPI Comments: Patient [facility] Has been taking Augmentin as prescribed and patient returns to report that condition has not responded favorably to medication. Area of redness and swelling has extended and central portion of wound has become necrotic and is spontaneously draining.  Denies fever, chills, malaise.  Reports himself to be otherwise healthy   Patient is a 28 y.o. male presenting with wound check. The history is provided by the patient.  Wound Check    Past Medical History  Diagnosis Date  . Kidney stone 2004   History reviewed. No pertinent past surgical history. History reviewed. No pertinent family history. History  Substance Use Topics  . Smoking status: Light Tobacco Smoker    Types: Cigarettes  . Smokeless tobacco: Not on file  . Alcohol Use: Yes     Comment: socially    Review of Systems  All other systems reviewed and are negative.   Allergies  Review of patient's allergies indicates no known allergies.  Home Medications   Prior to Admission medications   Medication Sig Start Date End Date Taking? Authorizing Provider  amoxicillin-clavulanate (AUGMENTIN) 875-125 MG per tablet Take 1 tablet by mouth every 12 (twelve) hours. 07/18/14   Hayden Rasmussenavid Mabe, NP  clindamycin (CLEOCIN) 300 MG capsule Take 1 capsule (300 mg total) by mouth 4 (four) times daily. 07/21/14   Ria ClockJennifer Lee H Contrell Ballentine, PA  HYDROcodone-acetaminophen (NORCO/VICODIN) 5-325 MG per tablet Take 1-2 tablets by mouth every 6 (six) hours as needed for moderate pain. 10/11/13   Samuel JesterKathleen McManus, DO   BP 138/93 mmHg  Pulse 103  Temp(Src) 98 F (36.7 C)  (Oral)  Resp 16  SpO2 96% Physical Exam  Constitutional: He is oriented to person, place, and time. He appears well-developed and well-nourished. No distress.  HENT:  Head: Normocephalic and atraumatic.  Eyes: Conjunctivae are normal.  Cardiovascular: Normal rate.   Pulmonary/Chest: Effort normal.  Musculoskeletal: Normal range of motion.  Neurological: He is alert and oriented to person, place, and time.  Skin: Skin is warm and dry. There is erythema.  Large area of erythema and induration at right posterior lower leg the covers about 1/3 of posterior surface of skin with 2cm annular area of necrotic tissue at central portion of area of erythema. No fluctuance.   Psychiatric: He has a normal mood and affect. His behavior is normal.  Nursing note and vitals reviewed.   ED Course  Procedures (including critical care time) Labs Review Labs Reviewed  CULTURE, ROUTINE-ABSCESS    Imaging Review No results found.   MDM   1. Cellulitis of right lower extremity    Central portion of wound necrotic and spontaneously draining and without palpable fluctuance indicating the need for incision and drainage. Abscess culture obtained and sent for C&S. Advised to discontinue Augmentin and begin clindamycin [facility] Area of cellulitis outlines and dated with surgical marker.  Advised to report to his nearest ER for re-evaluation should condition worsen despite changes in therapy or if fever develops.     Ria ClockJennifer Lee H Dhanvin Szeto, [place] 07/21/14 [phone]

## 2014-07-21 NOTE — ED Notes (Signed)
Pt reports he  May  Have  Initially been  Bitten by a  Spider       He was  Seen  3 days  Ago    And  Placed  On  Anti biotics    He reports    The  Wound is getting worse  It  Is red  / swollen  And  Draining      It is tender  To the touch

## 2014-07-24 ENCOUNTER — Telehealth (HOSPITAL_COMMUNITY): Payer: Self-pay | Admitting: *Deleted

## 2014-07-24 LAB — CULTURE, ROUTINE-ABSCESS

## 2014-07-24 MED ORDER — SULFAMETHOXAZOLE-TRIMETHOPRIM 800-160 MG PO TABS: 1.0000 | ORAL_TABLET | Freq: Two times a day (BID) | ORAL | Status: AC

## 2014-07-24 NOTE — ED Notes (Signed)
Abscess culture R lower leg: Few MRSA. Pt. was started on Augmentin on 12/21 and switched to Clindamycin on 12/24.  Culture came back today resistant to Clindamycin.  Narda BondsLee Presson PA notified and e-prescribed Bactrim DS. I called pt. Pt. verified x 2 and given result.  Pt. told to stop Clindamycin and take all of the Bactrim DS. Pt. told where to pick up his Rx. [facility] with him. Cory Little, Cory Little 07/24/2014

## 2014-07-29 HISTORY — PX: WISDOM TOOTH EXTRACTION: SHX21

## 2015-11-13 ENCOUNTER — Telehealth: Payer: Self-pay | Admitting: Internal Medicine

## 2015-11-13 NOTE — Telephone Encounter (Signed)
Pt request to reestablish, please advise, pt want to get a physical done.   # [phone]

## 2015-11-13 NOTE — Telephone Encounter (Signed)
Ok with me 

## 2015-11-15 NOTE — Telephone Encounter (Signed)
Left massage for pt to call back and schedule an appt.

## 2015-11-23 ENCOUNTER — Encounter: Payer: Self-pay | Admitting: Internal Medicine

## 2015-11-23 ENCOUNTER — Ambulatory Visit (INDEPENDENT_AMBULATORY_CARE_PROVIDER_SITE_OTHER): Payer: BLUE CROSS/BLUE SHIELD | Admitting: Internal Medicine

## 2015-11-23 ENCOUNTER — Other Ambulatory Visit (INDEPENDENT_AMBULATORY_CARE_PROVIDER_SITE_OTHER): Payer: BLUE CROSS/BLUE SHIELD

## 2015-11-23 VITALS — BP 108/84 | HR 86 | Temp 97.7°F | Resp 16 | Ht 69.0 in | Wt 282.0 lb

## 2015-11-23 DIAGNOSIS — Z23 Encounter for immunization: Secondary | ICD-10-CM

## 2015-11-23 DIAGNOSIS — G4700 Insomnia, unspecified: Secondary | ICD-10-CM | POA: Diagnosis not present

## 2015-11-23 DIAGNOSIS — G4730 Sleep apnea, unspecified: Secondary | ICD-10-CM | POA: Diagnosis not present

## 2015-11-23 DIAGNOSIS — F900 Attention-deficit hyperactivity disorder, predominantly inattentive type: Secondary | ICD-10-CM | POA: Diagnosis not present

## 2015-11-23 DIAGNOSIS — K219 Gastro-esophageal reflux disease without esophagitis: Secondary | ICD-10-CM | POA: Diagnosis not present

## 2015-11-23 DIAGNOSIS — Z0000 Encounter for general adult medical examination without abnormal findings: Secondary | ICD-10-CM

## 2015-11-23 DIAGNOSIS — Z5181 Encounter for therapeutic drug level monitoring: Secondary | ICD-10-CM | POA: Insufficient documentation

## 2015-11-23 DIAGNOSIS — Z79899 Other long term (current) drug therapy: Secondary | ICD-10-CM | POA: Insufficient documentation

## 2015-11-23 DIAGNOSIS — R072 Precordial pain: Secondary | ICD-10-CM

## 2015-11-23 DIAGNOSIS — Z9989 Dependence on other enabling machines and devices: Secondary | ICD-10-CM | POA: Insufficient documentation

## 2015-11-23 DIAGNOSIS — R7989 Other specified abnormal findings of blood chemistry: Secondary | ICD-10-CM

## 2015-11-23 DIAGNOSIS — G4733 Obstructive sleep apnea (adult) (pediatric): Secondary | ICD-10-CM | POA: Insufficient documentation

## 2015-11-23 LAB — CBC WITH DIFFERENTIAL/PLATELET
Basophils Absolute: 0.1 10*3/uL (ref 0.0–0.1)
Basophils Relative: 0.8 % (ref 0.0–3.0)
Eosinophils Absolute: 0.1 10*3/uL (ref 0.0–0.7)
Eosinophils Relative: 1.3 % (ref 0.0–5.0)
HCT: 42.7 % (ref 39.0–52.0)
Hemoglobin: 14.7 g/dL (ref 13.0–17.0)
Lymphocytes Relative: 39.7 % (ref 12.0–46.0)
Lymphs Abs: 2.8 10*3/uL (ref 0.7–4.0)
MCHC: 34.5 g/dL (ref 30.0–36.0)
MCV: 85.9 fl (ref 78.0–100.0)
Monocytes Absolute: 0.5 10*3/uL (ref 0.1–1.0)
Monocytes Relative: 7.6 % (ref 3.0–12.0)
Neutro Abs: 3.6 10*3/uL (ref 1.4–7.7)
Neutrophils Relative %: 50.6 % (ref 43.0–77.0)
Platelets: 264 10*3/uL (ref 150.0–400.0)
RBC: 4.98 Mil/uL (ref 4.22–5.81)
RDW: 13.5 % (ref 11.5–15.5)
WBC: 7 10*3/uL (ref 4.0–10.5)

## 2015-11-23 LAB — URINALYSIS, ROUTINE W REFLEX MICROSCOPIC
Bilirubin Urine: NEGATIVE
Hgb urine dipstick: NEGATIVE
Leukocytes, UA: NEGATIVE
Nitrite: NEGATIVE
Specific Gravity, Urine: 1.025 (ref 1.000–1.030)
Urine Glucose: NEGATIVE
Urobilinogen, UA: 0.2 (ref 0.0–1.0)
pH: 6 (ref 5.0–8.0)

## 2015-11-23 LAB — COMPREHENSIVE METABOLIC PANEL
ALT: 49 U/L (ref 0–53)
AST: 24 U/L (ref 0–37)
Albumin: 4.4 g/dL (ref 3.5–5.2)
Alkaline Phosphatase: 66 U/L (ref 39–117)
BUN: 11 mg/dL (ref 6–23)
CO2: 31 mEq/L (ref 19–32)
Calcium: 9.4 mg/dL (ref 8.4–10.5)
Chloride: 104 mEq/L (ref 96–112)
Creatinine, Ser: 1.14 mg/dL (ref 0.40–1.50)
GFR: 79.26 mL/min (ref >60.00)
Glucose, Bld: 91 mg/dL (ref 70–99)
Potassium: 3.8 mEq/L (ref 3.5–5.1)
Sodium: 141 mEq/L (ref 135–145)
Total Bilirubin: 0.6 mg/dL (ref 0.2–1.2)
Total Protein: 7.2 g/dL (ref 6.0–8.3)

## 2015-11-23 LAB — LIPID PANEL
Cholesterol: 217 mg/dL — ABNORMAL HIGH (ref 0–200)
HDL: 33.4 mg/dL — ABNORMAL LOW (ref >39.00)
NonHDL: 184.01
Total CHOL/HDL Ratio: 7
Triglycerides: 206 mg/dL — ABNORMAL HIGH (ref 0.0–149.0)
VLDL: 41.2 mg/dL — ABNORMAL HIGH (ref 0.0–40.0)

## 2015-11-23 LAB — LDL CHOLESTEROL, DIRECT: Direct LDL: 155 mg/dL

## 2015-11-23 LAB — TSH: TSH: 1.32 u[IU]/mL (ref 0.35–4.50)

## 2015-11-23 MED ORDER — AMPHETAMINE-DEXTROAMPHET ER 20 MG PO CP24: 20.0000 mg | ORAL_CAPSULE | Freq: Every day | ORAL | Status: DC

## 2015-11-23 MED ORDER — ZOLPIDEM TARTRATE 10 MG PO TABS: 10.0000 mg | ORAL_TABLET | Freq: Every evening | ORAL | Status: DC | PRN

## 2015-11-23 NOTE — Progress Notes (Signed)
Subjective:  Patient ID: Cory Little, male    DOB: Jan 21, 1986  Age: 30 y.o. MRN: [id number]  CC: Annual Exam; Gastroesophageal Reflux; and Chest Pain   HPI Cory Little presents for a CPX - Abduct seen him in many years.  He complains of left-sided anterior chest wall pain that he describes as a dull/achy sensation that only occurs at rest. He has a history of heartburn but tells me he is getting good symptom relief with over-the-counter doses of Nexium. The chest pain is not exertional and he denies diaphoresis, shortness of breath, radiation of the pain, dyspnea on exertion, edema, palpitations, or fatigue.  He also complains of heavy snoring, witnessed sleep apnea, insomnia, and frequent awakenings. He wants to be referred for a sleep test and to receive something to help with the insomnia.  He tells me that he has a history of ADHD and is currently trying to achieve an undergraduate degree at [employer]. He is having trouble with completing homework, staying on task, paying attention in the classroom, and achieving good grades. He previously took Adderall and it provided good symptom relief and he wants to try that again.  He also wants to be tested for all STDs. He has a new girlfriend and wants to be screened before they have sexual activity. He doesn't think he is at high risk for STDs.  Outpatient Prescriptions Prior to Visit  Medication Sig Dispense Refill  . amoxicillin-clavulanate (AUGMENTIN) 875-125 MG per tablet Take 1 tablet by mouth every 12 (twelve) hours. 14 tablet 0  . HYDROcodone-acetaminophen (NORCO/VICODIN) 5-325 MG per tablet Take 1-2 tablets by mouth every 6 (six) hours as needed for moderate pain.     No facility-administered medications prior to visit.    ROS Review of Systems  Constitutional: Negative.  Negative for fever, diaphoresis, appetite change and fatigue.  HENT: Negative.  Negative for congestion, postnasal drip, sinus pressure, sore throat and  trouble swallowing.   Eyes: Negative.   Respiratory: Negative.  Negative for cough, choking, chest tightness, shortness of breath and stridor.   Cardiovascular: Positive for chest pain. Negative for palpitations and leg swelling.  Gastrointestinal: Negative.  Negative for nausea, vomiting, abdominal pain, diarrhea, constipation and blood in stool.  Endocrine: Negative.   Genitourinary: Negative.  Negative for dysuria, penile swelling, scrotal swelling, difficulty urinating, genital sores and testicular pain.  Musculoskeletal: Negative.  Negative for myalgias, back pain, joint swelling and arthralgias.  Skin: Negative.  Negative for color change, pallor and rash.  Allergic/Immunologic: Negative.   Neurological: Negative.   Hematological: Negative.   Psychiatric/Behavioral: Positive for sleep disturbance and decreased concentration. Negative for suicidal ideas, hallucinations, behavioral problems, confusion, self-injury, dysphoric mood and agitation. The patient is not nervous/anxious and is not hyperactive.     Objective:  BP 108/84 mmHg  Pulse 86  Temp(Src) 97.7 F (36.5 C) (Oral)  Resp 16  Ht  (1.753 m)  Wt 282 lb (127.914 kg)  BMI 41.63 kg/m2  SpO2 96%  BP Readings from Last 3 Encounters:  11/23/15 108/84  07/21/14 138/93  07/18/14 126/96    Wt Readings from Last 3 Encounters:  11/23/15 282 lb (127.914 kg)  10/11/13 262 lb 3.2 oz (118.933 kg)  03/06/10 278 lb (126.1 kg)    Physical Exam  Constitutional: He is oriented to person, place, and time. No distress.  HENT:  Head: Normocephalic and atraumatic.  Mouth/Throat: Oropharynx is clear and moist. No oropharyngeal exudate.  Eyes: Conjunctivae are normal. Right  eye exhibits no discharge. Left eye exhibits no discharge. No scleral icterus.  Neck: Normal range of motion. Neck supple. No JVD present. No tracheal deviation present. No thyromegaly present.  Cardiovascular: Normal rate, regular rhythm, normal heart sounds  and intact distal pulses.  Exam reveals no gallop and no friction rub.   No murmur heard. EKG ---  Sinus  Rhythm  -Horizontal axis for age.   Voltage criteria for LVH  (R(I)+S(III) exceeds 2.50 mV)  -Voltage criteria w/o ST/T abnormality may be normal.   BORDERLINE- this EKG is a normal variant, the finding is consistent with his body habitus which is obese with a large chest.  Pulmonary/Chest: Effort normal and breath sounds normal. No stridor. No respiratory distress. He has no wheezes. He has no rales. He exhibits no tenderness.  Abdominal: Soft. Bowel sounds are normal. He exhibits no distension and no mass. There is no tenderness. There is no rebound and no guarding.  Musculoskeletal: Normal range of motion. He exhibits no edema or tenderness.  Lymphadenopathy:    He has no cervical adenopathy.  Neurological: He is oriented to person, place, and time.  Skin: Skin is warm and dry. No rash noted. He is not diaphoretic. No erythema. No pallor.  Psychiatric: He has a normal mood and affect. His behavior is normal. Judgment and thought content normal.  Vitals reviewed.   Lab Results  Component Value Date   WBC 7.0 11/23/2015   HGB 14.7 11/23/2015   HCT 42.7 11/23/2015   PLT 264.0 11/23/2015   GLUCOSE 91 11/23/2015   CHOL 217* 11/23/2015   TRIG 206.0* 11/23/2015   HDL 33.40* 11/23/2015   LDLDIRECT 155.0 11/23/2015   ALT 49 11/23/2015   AST 24 11/23/2015   NA 141 11/23/2015   K 3.8 11/23/2015   CL 104 11/23/2015   CREATININE 1.14 11/23/2015   BUN 11 11/23/2015   CO2 31 11/23/2015   TSH 1.32 11/23/2015    No results found.  Assessment & Plan:   Cory Little was seen today for annual exam, gastroesophageal reflux and chest pain.  Diagnoses and all orders for this visit:  Routine general medical examination at a health care facility- exam completed, labs ordered and reviewed, vaccines reviewed and updated, patient education material was given, screens for sexual transmitted  infections are all negative -     Lipid panel; Future -     Comprehensive metabolic panel; Future -     CBC with Differential/Platelet; Future -     TSH; Future -     HIV antibody; Future -     EKG 12-Lead -     Urinalysis, Routine w reflex microscopic (not at [facility]); Future -     RPR; Future -     GC/chlamydia probe amp, urine; Future  Insomnia with sleep apnea- he will try Ambien -     zolpidem (AMBIEN) 10 MG tablet; Take 1 tablet (10 mg total) by mouth at bedtime as needed for sleep.  Gastroesophageal reflux disease without esophagitis- he is achieved good symptom relief with Nexium  Attention deficit hyperactivity disorder (ADHD), predominantly inattentive type -     Discontinue: amphetamine-dextroamphetamine (ADDERALL XR) 20 MG 24 hr capsule; Take 1 capsule (20 mg total) by mouth daily. -     Discontinue: amphetamine-dextroamphetamine (ADDERALL XR) 20 MG 24 hr capsule; Take 1 capsule (20 mg total) by mouth daily. -     Discontinue: amphetamine-dextroamphetamine (ADDERALL XR) 20 MG 24 hr capsule; Take 1 capsule (20 mg total) by  mouth daily. -     amphetamine-dextroamphetamine (ADDERALL XR) 20 MG 24 hr capsule; Take 1 capsule (20 mg total) by mouth daily.  Sleep apnea -     Ambulatory referral to Pulmonology  Precordial pain- his chest pain is not consistent with cardiac ischemia, it sounds like it's a combination of GERD plus musculoskeletal pain, he will continue Nexium, his d-dimer is negative for any concerns for pulmonary embolus, his EKG is abnormal but it's a normal variant. -     EKG 12-Lead -     D-dimer, quantitative (not at [facility]); Future  Need for Tdap vaccination -     Tdap vaccine greater than or equal to 7yo IM  I have discontinued Mr. Audrie LiaBinder's HYDROcodone-acetaminophen and amoxicillin-clavulanate. I am also having him start on zolpidem. Additionally, I am having him maintain his esomeprazole, cetirizine, and amphetamine-dextroamphetamine.  Meds ordered this  encounter  Medications  . esomeprazole (NEXIUM) 20 MG capsule    Sig: Take 20 mg by mouth daily at 12 noon.  . cetirizine (ZYRTEC) 10 MG tablet    Sig: Take 10 mg by mouth daily.  [name]. DISCONTD: amphetamine-dextroamphetamine (ADDERALL XR) 20 MG 24 hr capsule    Sig: Take 1 capsule (20 mg total) by mouth daily.    Dispense:  30 capsule    Refill:  0    FILL ON OR AFTER 11/23/15  . DISCONTD: amphetamine-dextroamphetamine (ADDERALL XR) 20 MG 24 hr capsule    Sig: Take 1 capsule (20 mg total) by mouth daily.    Dispense:  30 capsule    Refill:  0    FILL ON OR AFTER 12/23/15  . DISCONTD: amphetamine-dextroamphetamine (ADDERALL XR) 20 MG 24 hr capsule    Sig: Take 1 capsule (20 mg total) by mouth daily.    Dispense:  30 capsule    Refill:  0    FILL ON OR AFTER 01/23/16  . amphetamine-dextroamphetamine (ADDERALL XR) 20 MG 24 hr capsule    Sig: Take 1 capsule (20 mg total) by mouth daily.    Dispense:  30 capsule    Refill:  0    FILL ON OR AFTER 02/22/16  . zolpidem (AMBIEN) 10 MG tablet    Sig: Take 1 tablet (10 mg total) by mouth at bedtime as needed for sleep.    Dispense:  30 tablet    Refill:  3     Follow-up: Return in about 4 months (around 03/24/2016).  Sanda Lingerhomas Jaxin Fulfer, MD

## 2015-11-23 NOTE — Patient Instructions (Signed)

## 2015-11-23 NOTE — Progress Notes (Signed)
Pre visit review using our clinic review tool, if applicable. No additional management support is needed unless otherwise documented below in the visit note. 

## 2015-11-24 LAB — RPR

## 2015-11-24 LAB — D-DIMER, QUANTITATIVE: D-Dimer, Quant: 0.22 ug/mL-FEU (ref 0.00–0.48)

## 2015-11-24 LAB — GC/CHLAMYDIA PROBE AMP
CT Probe RNA: NOT DETECTED
GC Probe RNA: NOT DETECTED

## 2015-11-24 LAB — HIV ANTIBODY (ROUTINE TESTING W REFLEX): HIV 1&2 Ab, 4th Generation: NONREACTIVE

## 2015-11-25 ENCOUNTER — Encounter: Payer: Self-pay | Admitting: Internal Medicine

## 2016-02-20 ENCOUNTER — Ambulatory Visit (HOSPITAL_COMMUNITY)
Admission: EM | Admit: 2016-02-20 | Discharge: 2016-02-20 | Disposition: A | Discharge status: Home / Self Care | Payer: BLUE CROSS/BLUE SHIELD | Attending: Family Medicine | Admitting: Family Medicine

## 2016-02-20 ENCOUNTER — Encounter (HOSPITAL_COMMUNITY): Payer: Self-pay | Admitting: Emergency Medicine

## 2016-02-20 DIAGNOSIS — Z79899 Other long term (current) drug therapy: Secondary | ICD-10-CM | POA: Diagnosis not present

## 2016-02-20 DIAGNOSIS — N341 Nonspecific urethritis: Secondary | ICD-10-CM | POA: Diagnosis not present

## 2016-02-20 DIAGNOSIS — R350 Frequency of micturition: Secondary | ICD-10-CM | POA: Diagnosis not present

## 2016-02-20 DIAGNOSIS — N342 Other urethritis: Secondary | ICD-10-CM | POA: Diagnosis not present

## 2016-02-20 DIAGNOSIS — N390 Urinary tract infection, site not specified: Secondary | ICD-10-CM | POA: Diagnosis present

## 2016-02-20 LAB — POCT URINALYSIS DIP (DEVICE)
Bilirubin Urine: NEGATIVE
Glucose, UA: NEGATIVE mg/dL
Ketones, ur: NEGATIVE mg/dL
Leukocytes, UA: NEGATIVE
Nitrite: POSITIVE — AB
Protein, ur: NEGATIVE mg/dL
Specific Gravity, Urine: 1.025 (ref 1.005–1.030)
Urobilinogen, UA: 1 mg/dL (ref 0.0–1.0)
pH: 6 (ref 5.0–8.0)

## 2016-02-20 MED ORDER — DOXYCYCLINE HYCLATE 100 MG PO CAPS: 100.0000 mg | ORAL_CAPSULE | Freq: Two times a day (BID) | ORAL | 0 refills | Status: DC

## 2016-02-20 NOTE — ED Notes (Signed)
Urine specimen obtained while patient in waiting/lobby. Urine specimen in lab

## 2016-02-20 NOTE — ED Provider Notes (Signed)
[facility]    CSN: [id number] Arrival date & time: 02/20/16  4098  First Provider Contact:  First MD Initiated Contact with Patient 02/20/16 2119        History   Chief Complaint Chief Complaint  Patient presents with  . Urinary Tract Infection    HPI Cory Little is a 30 y.o. male.    Urinary Frequency  This is a new problem. The current episode started more than 1 week ago (7-10 days of sx.). The problem has been gradually worsening. Pertinent negatives include no abdominal pain. Associated symptoms comments: Recently had full std check at [facility] and everything neg. Given cipro but no response after 3 days and d/c'd.. Exacerbated by: urination feels like razor blades.    Past Medical History:  Diagnosis Date  . Kidney stone 2004    Patient Active Problem List   Diagnosis Date Noted  . Routine general medical examination at a health care facility 11/23/2015  . Sleep apnea 11/23/2015  . Attention deficit hyperactivity disorder (ADHD) 03/06/2010  . GERD 02/17/2009  . 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 WISDOM TOOTH EXTRACTION  2016       Home Medications    Prior to Admission medications   Medication Sig Start Date End Date Taking? Authorizing Provider  cetirizine (ZYRTEC) 10 MG tablet Take 10 mg by mouth daily.   Yes Historical Provider, MD  omeprazole (PRILOSEC) 20 MG capsule Take 20 mg by mouth daily.   Yes Historical Provider, MD  amphetamine-dextroamphetamine (ADDERALL XR) 20 MG 24 hr capsule Take 1 capsule (20 mg total) by mouth daily. 11/23/15   Etta Grandchild, MD  doxycycline (VIBRAMYCIN) 100 MG capsule Take 1 capsule (100 mg total) by mouth 2 (two) times daily. 02/20/16   Linna Hoff, MD  esomeprazole (NEXIUM) 20 MG capsule Take 20 mg by mouth daily at 12 noon.    Historical Provider, MD  zolpidem (AMBIEN) 10 MG tablet Take 1 tablet (10 mg total) by mouth at bedtime as needed  for sleep. 11/23/15 12/23/15  Etta Grandchild, MD    Family History No family history on file.  Social History Social History  Substance Use Topics  . Smoking status: Light Tobacco Smoker    Types: Cigarettes  . Smokeless tobacco: Never Used  . Alcohol use No     Comment: socially     Allergies   Review of patient's allergies indicates no known allergies.   Review of Systems Review of Systems  Gastrointestinal: Negative for abdominal pain.  Genitourinary: Positive for dysuria, frequency and penile pain. Negative for discharge, genital sores, penile swelling, scrotal swelling, testicular pain and urgency.     Physical Exam Triage Vital Signs ED Triage Vitals  Enc Vitals Group     BP 02/20/16 2052 (!) 173/115     Pulse Rate 02/20/16 2052 86     Resp 02/20/16 2052 18     Temp 02/20/16 2052 98.3 F (36.8 C)     Temp Source 02/20/16 2052 Oral     SpO2 02/20/16 2052 98 %     Weight 02/20/16 2052 271 lb (122.9 kg)     Height 02/20/16 2052 6' (1.829 m)     Head Circumference --      Peak Flow --      Pain Score 02/20/16 2100 0     Pain Loc --      Pain Edu? --  Excl. in GC? --    No data found.   Updated Vital Signs BP (!) 173/115 (BP Location: Left Arm)   Pulse 86   Temp 98.3 F (36.8 C) (Oral)   Resp 18   Ht 6' (1.829 m)   Wt 271 lb (122.9 kg)   SpO2 98%   BMI 36.75 kg/m   Visual Acuity Right Eye Distance:   Left Eye Distance:   Bilateral Distance:    Right Eye Near:   Left Eye Near:    Bilateral Near:     Physical Exam  Constitutional: He is oriented to person, place, and time. He appears well-developed and well-nourished.  Abdominal: Soft. Bowel sounds are normal.  Genitourinary: Penis normal.  Neurological: He is alert and oriented to person, place, and time.  Skin: Skin is warm and dry.  Nursing note and vitals reviewed.    UC Treatments / Results  Labs (all labs ordered are listed, but only abnormal results are displayed) Labs  Reviewed  POCT URINALYSIS DIP (DEVICE) - Abnormal; Notable for the following:       Result Value   Hgb urine dipstick TRACE (*)    Nitrite POSITIVE (*)    All other components within normal limits  URINE CULTURE    EKG  EKG Interpretation None       Radiology No results found.  Procedures Procedures (including critical care time)  Medications Ordered in UC Medications - No data to display   Initial Impression / Assessment and Plan / UC Course  I have reviewed the triage vital signs and the nursing notes.  Pertinent labs & imaging results that were available during my care of the patient were reviewed by me and considered in my medical decision making (see chart for details).  Clinical Course      Final Clinical Impressions(s) / UC Diagnoses   Final diagnoses:  Urethritis, nonspecific    New Prescriptions New Prescriptions   DOXYCYCLINE (VIBRAMYCIN) 100 MG CAPSULE    Take 1 capsule (100 mg total) by mouth 2 (two) times daily.     Linna Hoff, MD 02/20/16 2138

## 2016-02-20 NOTE — ED Triage Notes (Signed)
PT reports he was seen last week for a UTI and prescribed Cipro. PT took three days as prescribed and symptoms did not improve so he stopped. PT reports pain with urination.

## 2016-02-20 NOTE — Discharge Instructions (Signed)
Take all of medicine, we will call with test results.

## 2016-02-22 LAB — URINE CULTURE

## 2016-02-28 DIAGNOSIS — G4733 Obstructive sleep apnea (adult) (pediatric): Secondary | ICD-10-CM | POA: Diagnosis not present

## 2016-03-25 DIAGNOSIS — J069 Acute upper respiratory infection, unspecified: Secondary | ICD-10-CM | POA: Diagnosis not present

## 2016-03-25 DIAGNOSIS — J208 Acute bronchitis due to other specified organisms: Secondary | ICD-10-CM | POA: Diagnosis not present

## 2016-03-30 DIAGNOSIS — G4733 Obstructive sleep apnea (adult) (pediatric): Secondary | ICD-10-CM | POA: Diagnosis not present

## 2016-04-02 DIAGNOSIS — R0989 Other specified symptoms and signs involving the circulatory and respiratory systems: Secondary | ICD-10-CM | POA: Diagnosis not present

## 2016-04-02 DIAGNOSIS — H6692 Otitis media, unspecified, left ear: Secondary | ICD-10-CM | POA: Diagnosis not present

## 2016-04-07 DIAGNOSIS — H6692 Otitis media, unspecified, left ear: Secondary | ICD-10-CM | POA: Diagnosis not present

## 2016-04-08 DIAGNOSIS — Z79899 Other long term (current) drug therapy: Secondary | ICD-10-CM | POA: Diagnosis not present

## 2016-04-08 DIAGNOSIS — H9202 Otalgia, left ear: Secondary | ICD-10-CM | POA: Diagnosis not present

## 2016-04-08 DIAGNOSIS — H6502 Acute serous otitis media, left ear: Secondary | ICD-10-CM | POA: Diagnosis not present

## 2016-04-28 DIAGNOSIS — R0602 Shortness of breath: Secondary | ICD-10-CM | POA: Diagnosis not present

## 2016-04-28 DIAGNOSIS — J039 Acute tonsillitis, unspecified: Secondary | ICD-10-CM | POA: Diagnosis not present

## 2016-04-28 DIAGNOSIS — R61 Generalized hyperhidrosis: Secondary | ICD-10-CM | POA: Diagnosis not present

## 2016-04-28 DIAGNOSIS — J189 Pneumonia, unspecified organism: Secondary | ICD-10-CM | POA: Diagnosis not present

## 2016-04-28 DIAGNOSIS — H6502 Acute serous otitis media, left ear: Secondary | ICD-10-CM | POA: Diagnosis not present

## 2016-04-28 DIAGNOSIS — R05 Cough: Secondary | ICD-10-CM | POA: Diagnosis not present

## 2016-04-29 DIAGNOSIS — G4733 Obstructive sleep apnea (adult) (pediatric): Secondary | ICD-10-CM | POA: Diagnosis not present

## 2016-05-30 DIAGNOSIS — G4733 Obstructive sleep apnea (adult) (pediatric): Secondary | ICD-10-CM | POA: Diagnosis not present

## 2016-06-29 DIAGNOSIS — G4733 Obstructive sleep apnea (adult) (pediatric): Secondary | ICD-10-CM | POA: Diagnosis not present

## 2016-07-30 DIAGNOSIS — G4733 Obstructive sleep apnea (adult) (pediatric): Secondary | ICD-10-CM | POA: Diagnosis not present

## 2016-08-01 DIAGNOSIS — Z23 Encounter for immunization: Secondary | ICD-10-CM | POA: Diagnosis not present

## 2016-08-30 DIAGNOSIS — G4733 Obstructive sleep apnea (adult) (pediatric): Secondary | ICD-10-CM | POA: Diagnosis not present

## 2016-12-21 ENCOUNTER — Emergency Department (HOSPITAL_COMMUNITY)
Admission: EM | Admit: 2016-12-21 | Discharge: 2016-12-22 | Disposition: A | Discharge status: Home / Self Care | Payer: BLUE CROSS/BLUE SHIELD | Attending: Emergency Medicine | Admitting: Emergency Medicine

## 2016-12-21 ENCOUNTER — Emergency Department (HOSPITAL_COMMUNITY): Payer: BLUE CROSS/BLUE SHIELD

## 2016-12-21 ENCOUNTER — Encounter (HOSPITAL_COMMUNITY): Payer: Self-pay | Admitting: Emergency Medicine

## 2016-12-21 DIAGNOSIS — J181 Lobar pneumonia, unspecified organism: Secondary | ICD-10-CM

## 2016-12-21 DIAGNOSIS — Z79899 Other long term (current) drug therapy: Secondary | ICD-10-CM | POA: Insufficient documentation

## 2016-12-21 DIAGNOSIS — F17210 Nicotine dependence, cigarettes, uncomplicated: Secondary | ICD-10-CM | POA: Diagnosis not present

## 2016-12-21 DIAGNOSIS — R3129 Other microscopic hematuria: Secondary | ICD-10-CM

## 2016-12-21 DIAGNOSIS — R319 Hematuria, unspecified: Secondary | ICD-10-CM | POA: Diagnosis not present

## 2016-12-21 DIAGNOSIS — R05 Cough: Secondary | ICD-10-CM | POA: Diagnosis not present

## 2016-12-21 DIAGNOSIS — J189 Pneumonia, unspecified organism: Secondary | ICD-10-CM | POA: Insufficient documentation

## 2016-12-21 DIAGNOSIS — R059 Cough, unspecified: Secondary | ICD-10-CM

## 2016-12-21 DIAGNOSIS — M545 Low back pain: Secondary | ICD-10-CM | POA: Diagnosis present

## 2016-12-21 DIAGNOSIS — R509 Fever, unspecified: Secondary | ICD-10-CM | POA: Diagnosis not present

## 2016-12-21 DIAGNOSIS — K760 Fatty (change of) liver, not elsewhere classified: Secondary | ICD-10-CM | POA: Diagnosis not present

## 2016-12-21 LAB — CBC WITH DIFFERENTIAL/PLATELET
Basophils Absolute: 0 10*3/uL (ref 0.0–0.1)
Basophils Relative: 0 %
Eosinophils Absolute: 0 10*3/uL (ref 0.0–0.7)
Eosinophils Relative: 0 %
HCT: 39 % (ref 39.0–52.0)
Hemoglobin: 14 g/dL (ref 13.0–17.0)
Lymphocytes Relative: 9 %
Lymphs Abs: 1.2 10*3/uL (ref 0.7–4.0)
MCH: 30.2 pg (ref 26.0–34.0)
MCHC: 35.9 g/dL (ref 30.0–36.0)
MCV: 84.2 fL (ref 78.0–100.0)
Monocytes Absolute: 1 10*3/uL (ref 0.1–1.0)
Monocytes Relative: 8 %
Neutro Abs: 11.3 10*3/uL — ABNORMAL HIGH (ref 1.7–7.7)
Neutrophils Relative %: 83 %
Platelets: 157 10*3/uL (ref 150–400)
RBC: 4.63 MIL/uL (ref 4.22–5.81)
RDW: 12.6 % (ref 11.5–15.5)
WBC: 13.5 10*3/uL — ABNORMAL HIGH (ref 4.0–10.5)

## 2016-12-21 LAB — URINALYSIS, ROUTINE W REFLEX MICROSCOPIC
Bacteria, UA: NONE SEEN
Bilirubin Urine: NEGATIVE
Glucose, UA: NEGATIVE mg/dL
Ketones, ur: 5 mg/dL — AB
Leukocytes, UA: NEGATIVE
Nitrite: NEGATIVE
Protein, ur: 30 mg/dL — AB
Specific Gravity, Urine: 1.024 (ref 1.005–1.030)
pH: 6 (ref 5.0–8.0)

## 2016-12-21 LAB — BASIC METABOLIC PANEL
Anion gap: 9 (ref 5–15)
BUN: 12 mg/dL (ref 6–20)
CO2: 23 mmol/L (ref 22–32)
Calcium: 8.5 mg/dL — ABNORMAL LOW (ref 8.9–10.3)
Chloride: 101 mmol/L (ref 101–111)
Creatinine, Ser: 1.36 mg/dL — ABNORMAL HIGH (ref 0.61–1.24)
GFR calc Af Amer: >60 mL/min (ref >60)
GFR calc non Af Amer: >60 mL/min (ref >60)
Glucose, Bld: 114 mg/dL — ABNORMAL HIGH (ref 65–99)
Potassium: 3.3 mmol/L — ABNORMAL LOW (ref 3.5–5.1)
Sodium: 133 mmol/L — ABNORMAL LOW (ref 135–145)

## 2016-12-21 MED ORDER — KETOROLAC TROMETHAMINE 15 MG/ML IJ SOLN
15.0000 mg | Freq: Once | INTRAMUSCULAR | Status: AC
  Administered 2016-12-21: 15 mg via INTRAVENOUS
  Filled 2016-12-21: qty 1

## 2016-12-21 MED ORDER — ONDANSETRON 4 MG PO TBDP
4.0000 mg | ORAL_TABLET | Freq: Once | ORAL | Status: AC
  Administered 2016-12-21: 4 mg via ORAL
  Filled 2016-12-21: qty 1

## 2016-12-21 MED ORDER — ACETAMINOPHEN 325 MG PO TABS
650.0000 mg | ORAL_TABLET | Freq: Once | ORAL | Status: AC
  Administered 2016-12-21: 650 mg via ORAL
  Filled 2016-12-21: qty 2

## 2016-12-21 NOTE — ED Triage Notes (Signed)
Patient c/o fever (temp 99.8), chills, dark colored urine (no burning with urination or odor), neck and leg pain onset of yesterday. Pt reports taking 3 ibuprofen a few hours ago.

## 2016-12-21 NOTE — ED Notes (Signed)
Pt aware we need urine sample. Urinal at bedside. 

## 2016-12-21 NOTE — ED Provider Notes (Signed)
[facility] DEPT Provider Note   CSN: [id number] Arrival date & time: 12/21/16  1911     History   Chief Complaint Chief Complaint  Patient presents with  . Chills  . Back Pain    HPI [facility] Little is a 31 y.o. male.  The history is provided by the patient.  Flank Pain  This is a new Little. The current episode started 2 days ago. The Little occurs constantly. The Little has been gradually worsening. Pertinent negatives include no chest pain, no abdominal pain, no headaches and no shortness of breath. The symptoms are aggravated by bending. Nothing relieves the symptoms. He has tried nothing for the symptoms.   Additionally patient reports a dry cough for 1-2 weeks. Also endorsing myalgias and decreased by mouth intake. Denies any nausea, vomiting, diarrhea, chest pain, shortness of breath.  Reports that he had pneumonia last September or October.  Past Medical History:  Diagnosis Date  . Kidney stone 2004    Patient Active Little List   Diagnosis Date Noted  . Routine general medical examination at a health care facility 11/23/2015  . Sleep apnea 11/23/2015  . Attention deficit hyperactivity disorder (ADHD) 03/06/2010  . GERD 02/17/2009  . 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 WISDOM TOOTH EXTRACTION  2016       Home Medications    Prior to Admission medications   Medication Sig Start Date End Date Taking? Authorizing Provider  cetirizine (ZYRTEC) 10 MG tablet Take 10 mg by mouth daily.   Yes [provider]  CHOLINE PO Take 1 tablet by mouth daily.   Yes [provider]  ibuprofen (ADVIL,MOTRIN) 200 MG tablet Take 600 mg by mouth every 6 (six) hours as needed.   Yes [provider]  L-LYSINE PO Take 1 tablet by mouth daily.   Yes [provider]  MAGNESIUM PO Take 1 tablet by mouth daily.   Yes [provider]  Multiple Vitamin (MULTIVITAMIN WITH  MINERALS) TABS tablet Take 1 tablet by mouth daily.   Yes [provider]  Omega-3 Fatty Acids (FISH OIL PO) Take 1 tablet by mouth daily.   Yes [provider]  omeprazole (PRILOSEC) 20 MG capsule Take 20 mg by mouth daily.   Yes [provider]  acetaminophen (TYLENOL) 500 MG tablet Take 2 tablets (1,000 mg total) by mouth every 8 (eight) hours as needed for moderate pain or fever. Do not take more than 4000 mg of acetaminophen (Tylenol) in a 24-hour period. Please note that other medicines that you may be prescribed may have Tylenol as well. 12/22/16 12/27/16  Nira Conn, MD  amphetamine-dextroamphetamine (ADDERALL XR) 20 MG 24 hr capsule Take 1 capsule (20 mg total) by mouth daily. Patient not taking: Reported on 12/21/2016 11/23/15   Etta Grandchild, MD  doxycycline (VIBRAMYCIN) 100 MG capsule Take 1 capsule (100 mg total) by mouth 2 (two) times daily. 12/22/16 12/29/16  Nira Conn, MD  zolpidem (AMBIEN) 10 MG tablet Take 1 tablet (10 mg total) by mouth at bedtime as needed for sleep. 11/23/15 12/23/15  Etta Grandchild, MD    Family History No family history on file.  Social History Social History  Substance Use Topics  . Smoking status: Light Tobacco Smoker    Types: Cigarettes  . Smokeless tobacco: Never Used  . Alcohol use No     Comment: socially     Allergies  Patient has no known allergies.   Review of Systems Review of Systems  Respiratory: Negative for shortness of breath.   Cardiovascular: Negative for chest pain.  Gastrointestinal: Negative for abdominal pain.  Genitourinary: Positive for flank pain.  Neurological: Negative for headaches.  All other systems are reviewed and are negative for acute change except as noted in the HPI    Physical Exam Updated Vital Signs BP 116/69 (BP Location: Right Arm)   Pulse (!) 120   Temp 99.8 F (37.7 C) (Oral)   Resp 20   SpO2 99%   Physical Exam  Constitutional: He is  oriented to person, place, and time. He appears well-developed and well-nourished. No distress.  HENT:  Head: Normocephalic and atraumatic.  Nose: Nose normal.  Eyes: Conjunctivae and EOM are normal. Pupils are equal, round, and reactive to light. Right eye exhibits no discharge. Left eye exhibits no discharge. No scleral icterus.  Neck: Normal range of motion. Neck supple.  Cardiovascular: Normal rate and regular rhythm.  Exam reveals no gallop and no friction rub.   No murmur heard. Pulmonary/Chest: Effort normal and breath sounds normal. No stridor. No respiratory distress. He has no rales.  Abdominal: Soft. He exhibits no distension. There is no tenderness. There is CVA tenderness (left). Hernia confirmed negative in the right inguinal area and confirmed negative in the left inguinal area.  Genitourinary: Penis normal. Right testis shows no mass, no swelling and no tenderness. Left testis shows no mass, no swelling and no tenderness.  Musculoskeletal: He exhibits no edema or tenderness.  Neurological: He is alert and oriented to person, place, and time.  Skin: Skin is warm and dry. No rash noted. He is not diaphoretic. No erythema.  Psychiatric: He has a normal mood and affect.  Vitals reviewed.    ED Treatments / Results  Labs (all labs ordered are listed, but only abnormal results are displayed) Labs Reviewed  CBC WITH DIFFERENTIAL/PLATELET - Abnormal; Notable for the following:       Result Value   WBC 13.5 (*)    Neutro Abs 11.3 (*)    All other components within normal limits  BASIC METABOLIC PANEL - Abnormal; Notable for the following:    Sodium 133 (*)    Potassium 3.3 (*)    Glucose, Bld 114 (*)    Creatinine, Ser 1.36 (*)    Calcium 8.5 (*)    All other components within normal limits  URINALYSIS, ROUTINE W REFLEX MICROSCOPIC - Abnormal; Notable for the following:    Color, Urine AMBER (*)    Hgb urine dipstick MODERATE (*)    Ketones, ur 5 (*)    Protein, ur 30  (*)    Squamous Epithelial / LPF 0-5 (*)    All other components within normal limits    EKG  EKG Interpretation None       Radiology Dg Chest 2 View  Result Date: 12/21/2016 CLINICAL DATA:  Patient c/o fever (temp 99.8), chills, dark colored urine (no burning with urination or odor), neck and leg pain onset of yesterday. Pt reports taking 3 ibuprofen a few hours ago. Former smoker EXAM: CHEST  2 VIEW COMPARISON:  None. FINDINGS: Dense rounded opacity within the anterior portion of the right upper lobe, most likely a rounded pneumonia, measuring 8 x 7.5 cm. Lungs otherwise clear. Heart size and mediastinal contours are normal. No acute or suspicious osseous finding. IMPRESSION: 1. Large rounded opacity within the right upper lobe, measuring approximately 8 x 7.5  cm, most likely a rounded pneumonia. Recommend follow-up chest x-rays to ensure resolution and exclude the alternative possibility of this representing a neoplastic mass. 2. Heart size is normal. Electronically Signed   By: Bary Richard M.D.   On: 12/21/2016 23:58   Ct Renal Stone Study  Result Date: 12/21/2016 CLINICAL DATA:  Acute onset of fever and chills. Dark urine. Neck and leg pain, acute onset. Initial encounter. EXAM: CT ABDOMEN AND PELVIS WITHOUT CONTRAST TECHNIQUE: Multidetector CT imaging of the abdomen and pelvis was performed following the standard protocol without IV contrast. COMPARISON:  None. FINDINGS: Lower chest: The visualized lung bases are grossly clear. The visualized portions of the mediastinum are unremarkable. Hepatobiliary: There is diffuse fatty infiltration within the liver. The gallbladder is unremarkable in appearance. The common bile duct remains normal in caliber. Pancreas: The pancreas is within normal limits. Spleen: The spleen is unremarkable in appearance. Adrenals/Urinary Tract: The adrenal glands are unremarkable in appearance. Mild nonspecific perinephric stranding is noted bilaterally. There is no  evidence of hydronephrosis. No renal or ureteral stones are seen. Stomach/Bowel: The stomach is unremarkable in appearance. The small bowel is within normal limits. The appendix is normal in caliber, without evidence of appendicitis. The colon is unremarkable in appearance. Vascular/Lymphatic: Minimal calcification is seen along the right common iliac artery. No significant calcific atherosclerotic disease is seen. No retroperitoneal or pelvic sidewall lymphadenopathy is appreciated. The inferior vena cava is unremarkable in appearance. Reproductive: The bladder is mildly distended and grossly unremarkable. The prostate remains normal in size. Scattered calcification is seen within the prostate. Other: No additional soft tissue abnormalities are seen. Musculoskeletal: No acute osseous abnormalities are identified. The visualized musculature is unremarkable in appearance. IMPRESSION: 1. No acute abnormality seen within the abdomen or pelvis. 2. Diffuse fatty infiltration within the liver. Electronically Signed   By: Roanna Raider M.D.   On: 12/21/2016 22:37    Procedures Procedures (including critical care time)  EMERGENCY DEPARTMENT US RENAL EXAM  "Study: Limited Retroperitoneal Ultrasound of Kidneys"  INDICATIONS: Flank pain Long and short axis of both kidneys were obtained.   PERFORMED BY: Myself IMAGES ARCHIVED?: Yes LIMITATIONS: Body habitus VIEWS USED: Long axis and Short axis  INTERPRETATION: Left  Hydronephrosis mild, No Renal cyst, No Kidney stone    Medications Ordered in ED Medications  ondansetron (ZOFRAN-ODT) disintegrating tablet 4 mg (4 mg Oral Given 12/21/16 2036)  ketorolac (TORADOL) 15 MG/ML injection 15 mg (15 mg Intravenous Given 12/21/16 2040)  acetaminophen (TYLENOL) tablet 650 mg (650 mg Oral Given 12/21/16 2256)  doxycycline (VIBRA-TABS) tablet 100 mg (100 mg Oral Given 12/22/16 0017)     Initial Impression / Assessment and Plan / ED Course  I have reviewed the  triage vital signs and the nursing notes.  Pertinent labs & imaging results that were available during my care of the patient were reviewed by me and considered in my medical decision making (see chart for details).     1. Flank pain Bedside ultrasound with very minimal hydronephrosis. UA with hematuria no evidence of infection. CT scan obtained revealing no obvious renal stones. Patient treated with Toradol which has significant improvement in his flank pain.  2. Cough and myalgias Patient is afebrile, tachycardic. Labs with leukocytosis. Chest x-ray revealed right upper lobe pneumonia. Patient able to tolerate by mouth intake. Labs did reveal mild renal sufficiency likely secondary to dehydration. Patient provided with IV and oral hydration. Improved tachycardia. Low port score. The patient is appropriate for outpatient management.  The patient is safe for discharge with strict return precautions.   Final Clinical Impressions(s) / ED Diagnoses   Final diagnoses:  Cough  Community acquired pneumonia of right upper lobe of lung (HCC)  Other microscopic hematuria   Disposition: Discharge  Condition: Good  I have discussed the results, Dx and Tx plan with the patient who expressed understanding and agree(s) with the plan. Discharge instructions discussed at great length. The patient was given strict return precautions who verbalized understanding of the instructions. No further questions at time of discharge.    New Prescriptions   ACETAMINOPHEN (TYLENOL) 500 MG TABLET    Take 2 tablets (1,000 mg total) by mouth every 8 (eight) hours as needed for moderate pain or fever. Do not take more than 4000 mg of acetaminophen (Tylenol) in a 24-hour period. Please note that other medicines that you may be prescribed may have Tylenol as well.   DOXYCYCLINE (VIBRAMYCIN) 100 MG CAPSULE    Take 1 capsule (100 mg total) by mouth 2 (two) times daily.    Follow Up: Etta GrandchildJones, Thomas L, MD [address] [place] [id number] [phone]  Schedule an appointment as soon as possible for a visit  in 3-5 days, If symptoms do not improve or  worsen      Nhu Glasby, Amadeo GarnetPedro Eduardo, MD 12/22/16 [phone]

## 2016-12-22 MED ORDER — ACETAMINOPHEN 500 MG PO TABS: 1000.0000 mg | ORAL_TABLET | Freq: Three times a day (TID) | ORAL | 0 refills | Status: AC | PRN

## 2016-12-22 MED ORDER — DOXYCYCLINE HYCLATE 100 MG PO TABS
100.0000 mg | ORAL_TABLET | Freq: Once | ORAL | Status: AC
  Administered 2016-12-22: 100 mg via ORAL
  Filled 2016-12-22: qty 1

## 2016-12-22 MED ORDER — DOXYCYCLINE HYCLATE 100 MG PO CAPS
100.0000 mg | ORAL_CAPSULE | Freq: Two times a day (BID) | ORAL | 0 refills | Status: AC
Start: 2016-12-22 — End: 2016-12-29

## 2016-12-24 DIAGNOSIS — J181 Lobar pneumonia, unspecified organism: Secondary | ICD-10-CM | POA: Diagnosis not present

## 2016-12-24 DIAGNOSIS — R509 Fever, unspecified: Secondary | ICD-10-CM | POA: Diagnosis not present

## 2016-12-24 DIAGNOSIS — Z79899 Other long term (current) drug therapy: Secondary | ICD-10-CM | POA: Diagnosis not present

## 2016-12-24 DIAGNOSIS — R112 Nausea with vomiting, unspecified: Secondary | ICD-10-CM | POA: Diagnosis not present

## 2016-12-24 DIAGNOSIS — J189 Pneumonia, unspecified organism: Secondary | ICD-10-CM | POA: Diagnosis not present

## 2016-12-25 DIAGNOSIS — J181 Lobar pneumonia, unspecified organism: Secondary | ICD-10-CM | POA: Diagnosis not present

## 2016-12-25 DIAGNOSIS — J189 Pneumonia, unspecified organism: Secondary | ICD-10-CM | POA: Diagnosis not present

## 2016-12-29 ENCOUNTER — Ambulatory Visit (HOSPITAL_COMMUNITY)
Admission: EM | Admit: 2016-12-29 | Discharge: 2016-12-29 | Disposition: A | Discharge status: Home / Self Care | Payer: BLUE CROSS/BLUE SHIELD | Attending: Internal Medicine | Admitting: Internal Medicine

## 2016-12-29 ENCOUNTER — Encounter (HOSPITAL_COMMUNITY): Payer: Self-pay | Admitting: Emergency Medicine

## 2016-12-29 DIAGNOSIS — H9202 Otalgia, left ear: Secondary | ICD-10-CM

## 2016-12-29 DIAGNOSIS — H60332 Swimmer's ear, left ear: Secondary | ICD-10-CM | POA: Diagnosis not present

## 2016-12-29 MED ORDER — AMOXICILLIN 875 MG PO TABS: 875.0000 mg | ORAL_TABLET | Freq: Two times a day (BID) | ORAL | 0 refills | Status: DC

## 2016-12-29 MED ORDER — NEOMYCIN-POLYMYXIN-HC 3.5-10000-1 OT SUSP: 4.0000 [drp] | Freq: Three times a day (TID) | OTIC | 0 refills | Status: DC

## 2016-12-29 NOTE — ED Triage Notes (Signed)
[facility] a [facility] ear pain and drainage x 2 days.

## 2016-12-29 NOTE — ED Provider Notes (Signed)
CSN: [id number]     Arrival date & time 12/29/16  1206 History   None    Chief Complaint  Patient presents with  . Otalgia   (Consider location/radiation/quality/duration/timing/severity/associated sxs/prior Treatment) Patient c/o left ear pain x 2 days.   The history is provided by the patient.  Otalgia  Location:  Left Behind ear:  No abnormality Quality:  Aching Severity:  Moderate Onset quality:  Sudden Duration:  2 days Timing:  Constant Progression:  Worsening Chronicity:  New Relieved by:  Nothing Worsened by:  Nothing   Past Medical History:  Diagnosis Date  . Kidney stone 2004   Past Surgical History:  Procedure Laterality Date  . WISDOM TOOTH EXTRACTION  2016   History reviewed. No pertinent family history. Social History  Substance Use Topics  . Smoking status: Light Tobacco Smoker    Types: Cigarettes  . Smokeless tobacco: Never Used  . Alcohol use No     Comment: socially    Review of Systems  Constitutional: Negative.   HENT: Positive for ear pain.   Eyes: Negative.   Respiratory: Negative.   Cardiovascular: Negative.   Gastrointestinal: Negative.   Endocrine: Negative.   Genitourinary: Negative.   Musculoskeletal: Negative.   Allergic/Immunologic: Negative.   Neurological: Negative.   Hematological: Negative.     Allergies  Patient has no known allergies.  Home Medications   Prior to Admission medications   Medication Sig Start Date End Date Taking? Authorizing Provider  doxycycline (VIBRAMYCIN) 100 MG capsule Take 1 capsule (100 mg total) by mouth 2 (two) times daily. 12/22/16 12/29/16 Yes Cardama, Amadeo GarnetPedro Eduardo, MD  amoxicillin (AMOXIL) 875 MG tablet Take 1 tablet (875 mg total) by mouth 2 (two) times daily. 12/29/16   Deatra Canterxford, William J, FNP  neomycin-polymyxin-hydrocortisone (CORTISPORIN) 3.5-10000-1 otic suspension Place 4 drops into the left ear 3 (three) times daily. 12/29/16   Deatra Canterxford, William J, FNP   Meds Ordered and Administered  this Visit  Medications - No data to display  BP (!) 143/100 (BP Location: Left Arm)   Pulse (!) 102   Temp 98.3 F (36.8 C) (Oral)   Resp 16   SpO2 98%  No data found.   Physical Exam  Constitutional: He appears well-developed and well-nourished.  HENT:  Head: Normocephalic and atraumatic.  Right Ear: External ear normal.  Mouth/Throat: Oropharynx is clear and moist.  Left EAC diminished and tender.  Eyes: Conjunctivae and EOM are normal. Pupils are equal, round, and reactive to light.  Neck: Normal range of motion. Neck supple.  Cardiovascular: Normal rate, regular rhythm and normal heart sounds.   Pulmonary/Chest: Effort normal and breath sounds normal.  Abdominal: Soft. Bowel sounds are normal.  Nursing note and vitals reviewed.   Urgent Care Course     Procedures (including critical care time)  Labs Review Labs Reviewed - No data to display  Imaging Review No results found.   Visual Acuity Review  Right Eye Distance:   Left Eye Distance:   Bilateral Distance:    Right Eye Near:   Left Eye Near:    Bilateral Near:         MDM   1. Acute swimmer's ear of left side   2. Otalgia of left ear    Cortisporin Otic Ear gtt's 4 gtt's left ear qid #10 ml Amoxicillin 875mg  one po bid x 10 days #20      Deatra CanterOxford, William J, FNP 12/29/16 1324

## 2016-12-29 NOTE — ED Notes (Signed)
Patient advised he was on his last dose of doxycycline today for pneumonia.

## 2017-02-12 DIAGNOSIS — M9903 Segmental and somatic dysfunction of lumbar region: Secondary | ICD-10-CM | POA: Diagnosis not present

## 2017-02-12 DIAGNOSIS — M545 Low back pain: Secondary | ICD-10-CM | POA: Diagnosis not present

## 2017-02-12 DIAGNOSIS — M546 Pain in thoracic spine: Secondary | ICD-10-CM | POA: Diagnosis not present

## 2017-02-12 DIAGNOSIS — M542 Cervicalgia: Secondary | ICD-10-CM | POA: Diagnosis not present

## 2017-02-20 ENCOUNTER — Ambulatory Visit (INDEPENDENT_AMBULATORY_CARE_PROVIDER_SITE_OTHER): Payer: BLUE CROSS/BLUE SHIELD | Admitting: Pulmonary Disease

## 2017-02-20 ENCOUNTER — Encounter: Payer: Self-pay | Admitting: Pulmonary Disease

## 2017-02-20 VITALS — HR 80 | Ht 72.0 in | Wt 270.0 lb

## 2017-02-20 DIAGNOSIS — Z9989 Dependence on other enabling machines and devices: Secondary | ICD-10-CM

## 2017-02-20 DIAGNOSIS — G4733 Obstructive sleep apnea (adult) (pediatric): Secondary | ICD-10-CM | POA: Diagnosis not present

## 2017-02-20 DIAGNOSIS — J351 Hypertrophy of tonsils: Secondary | ICD-10-CM

## 2017-02-20 NOTE — Patient Instructions (Signed)
ENT referral - we discussed possible lack of benefit from surgery  Need a copy of your sleep study  Weight loss recommended - if you lose to < 250 , call & we will repeat your study

## 2017-02-20 NOTE — Assessment & Plan Note (Signed)
He is incredibly self-conscious about using CPAP and is willing to use any and all measures including surgery to come off of this.   The pathophysiology of obstructive sleep apnea , it's cardiovascular consequences & modes of treatment including CPAP were discused with the patient in detail & they evidenced understanding.  He does have enlarged tonsils and I explained to him that results of tonsillectomy/UPP P are unpredictable especially in somebody with severe OSA. Certainly, oral appliance would also not be helpful.  His best approach would be to focus on weight loss over the next 6 months -if he is able to lose enough weight, we can repeat sleep study and if this is at moderate level then we can trial oral appliance.  History requested an ENT referral and I will provide him with that as a second opinion

## 2017-02-20 NOTE — Addendum Note (Signed)
Addended by: Maurene CapesPOTTS, Allie Ousley M on: 02/20/2017 04:36 PM   Modules accepted: Orders

## 2017-02-20 NOTE — Progress Notes (Signed)
Subjective:    Patient ID: Cory PalingRudolph L Evangelist, male    DOB: 03/30/1986, 31 y.o.   MRN: [id number]  HPI  Chief Complaint  Patient presents with  . Sleep Consult    OSA. Currently using a CPAP machine.     31 year old obese man presents as a self-referral for evaluation of obstructive sleep apnea. He had a split study performed in to room about a year ago and was told that he has severe OSA. They were unable to track down the sleep study. He obtained his CPAP machine by paying for it himself and supplies from [facility]. CPAP didn't help him with improving his energy levels and decreased his snoring. He is incredibly self-conscious about using his CPAP machine, especially when he is on a date or if he is traveling with his friends. He would like to use any other options including surgery to take care of his OSA.  Epworth sleepiness score is 4. Bedtime is around midnight but variable, sleep latency is minimal, he sleeps on his back with one pillow, denies nocturnal awakenings or significant nocturia and is out of bed by 8 AM feeling rested without dryness of mouth or headaches. He used to be an Ambien but has gotten off of this now and takes melatonin if he has trouble sleeping. His current weight is 270 pounds. He uses a full face mask and denies mask of pressure issues  He works in [employer] but is in between jobs at this time. He is currently single and no bed partner history is available    Past Medical History:  Diagnosis Date  . Kidney stone 2004    Past Surgical History:  Procedure Laterality Date  . WISDOM TOOTH EXTRACTION  2016    No Known Allergies   Social History   Social History  . Marital status: Single    Spouse name: N/A  . Number of children: N/A  . Years of education: N/A   Occupational History  . Not on file.   Social History Main Topics  . Smoking status: Light Tobacco Smoker    Types: Cigarettes  . Smokeless tobacco: Never Used  . Alcohol use No   Comment: socially  . Drug use: Yes    Types: Marijuana  . Sexual activity: Yes    Birth control/ protection: Condom   Other Topics Concern  . Not on file   Social History Narrative  . No narrative on file      No family history on file.   Review of Systems  Constitutional: Negative for fever and unexpected weight change.  HENT: Negative for congestion, dental problem, ear pain, nosebleeds, postnasal drip, rhinorrhea, sinus pressure, sneezing, sore throat and trouble swallowing.   Eyes: Negative for redness and itching.  Respiratory: Positive for cough and shortness of breath. Negative for chest tightness and wheezing.   Cardiovascular: Negative for palpitations and leg swelling.  Gastrointestinal: Negative for nausea and vomiting.  Genitourinary: Negative for dysuria.  Musculoskeletal: Negative for joint swelling.  Skin: Negative for rash.  Neurological: Negative for headaches.  Hematological: Does not bruise/bleed easily.  Psychiatric/Behavioral: Negative for dysphoric mood. The patient is not nervous/anxious.        Objective:   Physical Exam  Gen. Pleasant, obese, in no distress, normal affect ENT - Enlarged tonsils, no post nasal drip, class 2-3 airway Neck: No JVD, no thyromegaly, no carotid bruits Lungs: no use of accessory muscles, no dullness to percussion, decreased without rales or rhonchi  Cardiovascular: Rhythm  regular, heart sounds  normal, no murmurs or gallops, no peripheral edema Abdomen: soft and non-tender, no hepatosplenomegaly, BS normal. Musculoskeletal: No deformities, no cyanosis or clubbing Neuro:  alert, non focal, no tremors       Assessment & Plan:

## 2017-02-26 ENCOUNTER — Encounter: Payer: BLUE CROSS/BLUE SHIELD | Admitting: Internal Medicine

## 2017-02-26 ENCOUNTER — Telehealth: Payer: Self-pay | Admitting: Pulmonary Disease

## 2017-02-26 NOTE — Telephone Encounter (Signed)
Will route to Dr Alva as FYI 

## 2017-03-04 ENCOUNTER — Encounter: Payer: BLUE CROSS/BLUE SHIELD | Admitting: Internal Medicine

## 2017-03-10 ENCOUNTER — Telehealth: Payer: Self-pay | Admitting: Pulmonary Disease

## 2017-03-10 NOTE — Telephone Encounter (Signed)
Reviewed PSG 01/2016 >> severe OSA, AHI 134/h, nadir satn 66% Corrected by CPAP 12cm

## 2017-04-03 DIAGNOSIS — M542 Cervicalgia: Secondary | ICD-10-CM | POA: Diagnosis not present

## 2017-04-03 DIAGNOSIS — M546 Pain in thoracic spine: Secondary | ICD-10-CM | POA: Diagnosis not present

## 2017-04-03 DIAGNOSIS — M9902 Segmental and somatic dysfunction of thoracic region: Secondary | ICD-10-CM | POA: Diagnosis not present

## 2017-04-03 DIAGNOSIS — M9901 Segmental and somatic dysfunction of cervical region: Secondary | ICD-10-CM | POA: Diagnosis not present

## 2017-04-07 ENCOUNTER — Telehealth: Payer: Self-pay | Admitting: Pulmonary Disease

## 2017-04-07 DIAGNOSIS — Z9989 Dependence on other enabling machines and devices: Secondary | ICD-10-CM

## 2017-04-07 DIAGNOSIS — J351 Hypertrophy of tonsils: Secondary | ICD-10-CM

## 2017-04-07 DIAGNOSIS — G4733 Obstructive sleep apnea (adult) (pediatric): Secondary | ICD-10-CM

## 2017-04-07 NOTE — Telephone Encounter (Signed)
Called and spoke with pt. Pt is requesting referral for ENT. Pt states previous referral fell through due to issues with insurance.  02/20/17 OV note states referral will be placed. Referral has been placed.

## 2017-04-16 ENCOUNTER — Encounter: Payer: Self-pay | Admitting: Internal Medicine

## 2017-04-16 ENCOUNTER — Other Ambulatory Visit: Payer: BLUE CROSS/BLUE SHIELD

## 2017-04-16 ENCOUNTER — Other Ambulatory Visit (INDEPENDENT_AMBULATORY_CARE_PROVIDER_SITE_OTHER): Payer: BLUE CROSS/BLUE SHIELD

## 2017-04-16 ENCOUNTER — Ambulatory Visit (INDEPENDENT_AMBULATORY_CARE_PROVIDER_SITE_OTHER): Payer: BLUE CROSS/BLUE SHIELD | Admitting: Internal Medicine

## 2017-04-16 VITALS — BP 132/88 | HR 66 | Temp 98.6°F | Resp 16 | Ht 72.0 in | Wt 279.4 lb

## 2017-04-16 DIAGNOSIS — Z0000 Encounter for general adult medical examination without abnormal findings: Secondary | ICD-10-CM | POA: Diagnosis not present

## 2017-04-16 DIAGNOSIS — F900 Attention-deficit hyperactivity disorder, predominantly inattentive type: Secondary | ICD-10-CM | POA: Diagnosis not present

## 2017-04-16 DIAGNOSIS — E876 Hypokalemia: Secondary | ICD-10-CM

## 2017-04-16 DIAGNOSIS — R739 Hyperglycemia, unspecified: Secondary | ICD-10-CM | POA: Diagnosis not present

## 2017-04-16 DIAGNOSIS — D72829 Elevated white blood cell count, unspecified: Secondary | ICD-10-CM

## 2017-04-16 DIAGNOSIS — E669 Obesity, unspecified: Secondary | ICD-10-CM

## 2017-04-16 DIAGNOSIS — Z23 Encounter for immunization: Secondary | ICD-10-CM

## 2017-04-16 DIAGNOSIS — Z0001 Encounter for general adult medical examination with abnormal findings: Secondary | ICD-10-CM

## 2017-04-16 DIAGNOSIS — Z6837 Body mass index (BMI) 37.0-37.9, adult: Secondary | ICD-10-CM | POA: Insufficient documentation

## 2017-04-16 DIAGNOSIS — G4733 Obstructive sleep apnea (adult) (pediatric): Secondary | ICD-10-CM | POA: Diagnosis not present

## 2017-04-16 DIAGNOSIS — J351 Hypertrophy of tonsils: Secondary | ICD-10-CM | POA: Diagnosis not present

## 2017-04-16 LAB — CBC WITH DIFFERENTIAL/PLATELET
Basophils Absolute: 0.1 10*3/uL (ref 0.0–0.1)
Basophils Relative: 1.1 % (ref 0.0–3.0)
Eosinophils Absolute: 0.1 10*3/uL (ref 0.0–0.7)
Eosinophils Relative: 1.5 % (ref 0.0–5.0)
HCT: 44.1 % (ref 39.0–52.0)
Hemoglobin: 15.2 g/dL (ref 13.0–17.0)
Lymphocytes Relative: 34.4 % (ref 12.0–46.0)
Lymphs Abs: 2.6 10*3/uL (ref 0.7–4.0)
MCHC: 34.5 g/dL (ref 30.0–36.0)
MCV: 88 fl (ref 78.0–100.0)
Monocytes Absolute: 0.6 10*3/uL (ref 0.1–1.0)
Monocytes Relative: 8.1 % (ref 3.0–12.0)
Neutro Abs: 4.2 10*3/uL (ref 1.4–7.7)
Neutrophils Relative %: 54.9 % (ref 43.0–77.0)
Platelets: 230 10*3/uL (ref 150.0–400.0)
RBC: 5.02 Mil/uL (ref 4.22–5.81)
RDW: 13.1 % (ref 11.5–15.5)
WBC: 7.6 10*3/uL (ref 4.0–10.5)

## 2017-04-16 LAB — BASIC METABOLIC PANEL
BUN: 14 mg/dL (ref 6–23)
CO2: 27 mEq/L (ref 19–32)
Calcium: 9.4 mg/dL (ref 8.4–10.5)
Chloride: 106 mEq/L (ref 96–112)
Creatinine, Ser: 1.08 mg/dL (ref 0.40–1.50)
GFR: 83.63 mL/min (ref >60.00)
Glucose, Bld: 104 mg/dL — ABNORMAL HIGH (ref 70–99)
Potassium: 3.5 mEq/L (ref 3.5–5.1)
Sodium: 139 mEq/L (ref 135–145)

## 2017-04-16 LAB — LIPID PANEL
Cholesterol: 235 mg/dL — ABNORMAL HIGH (ref 0–200)
HDL: 41.6 mg/dL (ref >39.00)
NonHDL: 193.82
Total CHOL/HDL Ratio: 6
Triglycerides: 281 mg/dL — ABNORMAL HIGH (ref 0.0–149.0)
VLDL: 56.2 mg/dL — ABNORMAL HIGH (ref 0.0–40.0)

## 2017-04-16 LAB — LDL CHOLESTEROL, DIRECT: Direct LDL: 155 mg/dL

## 2017-04-16 LAB — HEMOGLOBIN A1C: Hgb A1c MFr Bld: 5.5 % (ref 4.6–6.5)

## 2017-04-16 LAB — MAGNESIUM: Magnesium: 2.2 mg/dL (ref 1.5–2.5)

## 2017-04-16 MED ORDER — AMPHETAMINE-DEXTROAMPHETAMINE 5 MG PO TABS: 5.0000 mg | ORAL_TABLET | Freq: Two times a day (BID) | ORAL | 0 refills | Status: DC

## 2017-04-16 NOTE — Patient Instructions (Signed)

## 2017-04-16 NOTE — Progress Notes (Signed)
Subjective:  Patient ID: Cory Little, male    DOB: 02-Aug-1985  Age: 31 y.o. MRN: [id number]  CC: Annual Exam   HPI Cory Little presents for a CPX.  He is trying to find a new job and complains of lack of focus, decreased level of concentration, difficulty completing tasks, and being inattentive. He has a history of ADHD and wants to try a stimulant again.  No outpatient prescriptions prior to visit.   No facility-administered medications prior to visit.     ROS Review of Systems  Constitutional: Negative.  Negative for appetite change, chills, diaphoresis, fatigue and unexpected weight change.  HENT: Negative for sinus pressure and trouble swallowing.   Eyes: Negative.   Respiratory: Negative.  Negative for cough, chest tightness, shortness of breath and wheezing.   Cardiovascular: Negative for chest pain, palpitations and leg swelling.  Gastrointestinal: Negative for abdominal pain, constipation, diarrhea and nausea.  Endocrine: Negative.   Genitourinary: Negative.  Negative for decreased urine volume, difficulty urinating and dysuria.  Musculoskeletal: Negative.   Skin: Negative.   Allergic/Immunologic: Negative.   Neurological: Negative.  Negative for dizziness, weakness and light-headedness.  Hematological: Negative for adenopathy. Does not bruise/bleed easily.  Psychiatric/Behavioral: Positive for decreased concentration. Negative for agitation, behavioral problems, confusion, hallucinations, self-injury and sleep disturbance. The patient is not nervous/anxious and is not hyperactive.     Objective:  BP 132/88 (BP Location: Left Arm, Patient Position: Sitting, Cuff Size: Large)   Pulse 66   Temp 98.6 F (37 C) (Oral)   Resp 16   Ht 6' (1.829 m)   Wt 279 lb 6.4 oz (126.7 kg)   SpO2 97%   BMI 37.89 kg/m   BP Readings from Last 3 Encounters:  04/16/17 132/88  12/29/16 (!) 143/100  12/22/16 (!) 163/101    Wt Readings from Last 3 Encounters:  04/16/17  279 lb 6.4 oz (126.7 kg)  02/20/17 270 lb (122.5 kg)  02/20/16 271 lb (122.9 kg)    Physical Exam  Constitutional: He is oriented to person, place, and time. No distress.  HENT:  Mouth/Throat: Oropharynx is clear and moist. No oropharyngeal exudate.  Eyes: Conjunctivae are normal. Right eye exhibits no discharge. Left eye exhibits no discharge. No scleral icterus.  Neck: Normal range of motion. Neck supple. No JVD present. No thyromegaly present.  Cardiovascular: Normal rate, regular rhythm and intact distal pulses.  Exam reveals no gallop and no friction rub.   No murmur heard. Pulmonary/Chest: Effort normal and breath sounds normal. No respiratory distress. He has no wheezes. He has no rales. He exhibits no tenderness.  Abdominal: Soft. Bowel sounds are normal. He exhibits no distension and no mass. There is no tenderness. There is no rebound and no guarding. Hernia confirmed negative in the right inguinal area and confirmed negative in the left inguinal area.  Genitourinary: Testes normal and penis normal. Right testis shows no mass, no swelling and no tenderness. Right testis is descended. Left testis shows no mass, no swelling and no tenderness. Left testis is descended. Circumcised. No penile erythema or penile tenderness. No discharge found.  Musculoskeletal: Normal range of motion. He exhibits no edema, tenderness or deformity.  Lymphadenopathy:    He has no cervical adenopathy.       Right: No inguinal adenopathy present.       Left: No inguinal adenopathy present.  Neurological: He is alert and oriented to person, place, and time.  Skin: Skin is warm and dry. No rash noted.  He is not diaphoretic. No erythema. No pallor.  Vitals reviewed.   Lab Results  Component Value Date   WBC 7.6 04/16/2017   HGB 15.2 04/16/2017   HCT 44.1 04/16/2017   PLT 230.0 04/16/2017   GLUCOSE 104 (H) 04/16/2017   CHOL 235 (H) 04/16/2017   TRIG 281.0 (H) 04/16/2017   HDL 41.60 04/16/2017    LDLDIRECT 155.0 04/16/2017   ALT 49 11/23/2015   AST 24 11/23/2015   NA 139 04/16/2017   K 3.5 04/16/2017   CL 106 04/16/2017   CREATININE 1.08 04/16/2017   BUN 14 04/16/2017   CO2 27 04/16/2017   TSH 1.32 11/23/2015   HGBA1C 5.5 04/16/2017    No results found.  Assessment & Plan:   [name] was seen today for annual exam.  Diagnoses and all orders for this visit:  Routine general medical examination at a health care facility- exam completed, labs reviewed, vaccines reviewed and updated, patient education material was given. -     Lipid panel; Future  Leukocytosis, unspecified type- his white cell count is normal now -     CBC with Differential/Platelet; Future  Hypokalemia- his potassium level is normal now -     Basic metabolic panel; Future -     Magnesium; Future  Hyperglycemia- his A1c is very slightly elevated at 5.5%. He was encouraged to improve his lifestyle modifications. -     Basic metabolic panel; Future -     Hemoglobin A1c; Future  Attention deficit hyperactivity disorder (ADHD), predominantly inattentive type -     amphetamine-dextroamphetamine (ADDERALL) 5 MG tablet; Take 1 tablet (5 mg total) by mouth 2 (two) times daily with a meal. -     amphetamine-dextroamphetamine (ADDERALL) 5 MG tablet; Take 1 tablet (5 mg total) by mouth 2 (two) times daily with a meal. -     amphetamine-dextroamphetamine (ADDERALL) 5 MG tablet; Take 1 tablet (5 mg total) by mouth 2 (two) times daily with a meal.  Obesity (BMI 35.0-39.9 without comorbidity) -     Amb Ref to Medical Weight Management  Need for influenza vaccination -     Flu Vaccine QUAD 36+ mos IM  Need for pneumococcal vaccination -     Pneumococcal polysaccharide vaccine 23-valent greater than or equal to 2yo subcutaneous/IM   I am having [name] start on amphetamine-dextroamphetamine, amphetamine-dextroamphetamine, and amphetamine-dextroamphetamine.  Meds ordered this encounter  Medications  .  amphetamine-dextroamphetamine (ADDERALL) 5 MG tablet    Sig: Take 1 tablet (5 mg total) by mouth 2 (two) times daily with a meal.    Dispense:  60 tablet    Refill:  0  . amphetamine-dextroamphetamine (ADDERALL) 5 MG tablet    Sig: Take 1 tablet (5 mg total) by mouth 2 (two) times daily with a meal.    Dispense:  60 tablet    Refill:  0  . amphetamine-dextroamphetamine (ADDERALL) 5 MG tablet    Sig: Take 1 tablet (5 mg total) by mouth 2 (two) times daily with a meal.    Dispense:  60 tablet    Refill:  0     Follow-up: Return in about 3 months (around 07/16/2017).  Sanda Linger, MD

## 2017-04-30 ENCOUNTER — Encounter: Payer: Self-pay | Admitting: Registered"

## 2017-04-30 ENCOUNTER — Encounter: Payer: BLUE CROSS/BLUE SHIELD | Attending: Otolaryngology | Admitting: Registered"

## 2017-04-30 DIAGNOSIS — Z713 Dietary counseling and surveillance: Secondary | ICD-10-CM | POA: Diagnosis present

## 2017-04-30 DIAGNOSIS — G4733 Obstructive sleep apnea (adult) (pediatric): Secondary | ICD-10-CM | POA: Insufficient documentation

## 2017-04-30 NOTE — Progress Notes (Signed)
Medical Nutrition Therapy:  Appt start time: 0930 end time:  1030.   Assessment:  Primary concerns today: Pt referred for weight management. Pt says he has lost about 7 lbs since he has started making dietary changes. Pt says he has cut out eating at fast food restaurants for the past 2 weeks and has stopped drinking soda and has been trying to cut down on his sweet tea intake. Pt struggles with poor body image. He reports he asked his doctor about bariatric surgery. He reports he asked about weight loss surgery because he knows someone who had bariatric surgery and they "looked really good" afterward. Pt says he has undergone facial liposuction in the past. He reports he used to take his Adderall as a diet pill to reduce his appetite when he was in high school, but reports he only takes it as needed to help with his concentration now. Pt says he is now getting good sleep. Pt reports he has some depression, but he does not want to take any medication for it due to concerns about possible medication side effects. Pt reports regular marijuana use. Pt takes several different herbal and vitamin supplements in addition to a daily multi-vitamin. He reports he started taking the additional supplements because he felt like he should because he is getting older.  Preferred Learning Style:  No preference indicated   Learning Readiness:  Ready  MEDICATIONS: See list.    DIETARY INTAKE:  Usual eating pattern includes 1-2 meals and maybe 1 snack per day. Typically only eats lunch and dinner, but recently started making an effort to eat breakfast. Meals at home are usually eaten in living room with TV, phone present.    Everyday foods vary.  Avoided foods include none.    24-hr recall:  B ( AM): part of a bagel with [place] and cream cheese, unsweet tea  Snk ( AM): None reported.  L (3 PM): baked spaghetti, cashews, sweet tea    Snk ( PM): None reported.  D (6-7 PM): steak and rice bowl from [employer], a little Mr. Freda Jackson   Snk ( PM): None reported.  Beverages: unsweet tea, sweet tea, a little Mr. Freda Jackson, water   Usual physical activity: Pt recently started walking 3-4 days per week for around 30 minutes at a time.    Progress Towards Goal(s):  In progress.   Nutritional Diagnosis:  NI-5.11.1 Predicted suboptimal nutrient intake As related to skipping meals and unbalanced diet low in vegetables and fruits .  As evidenced by pt's reported dietary recall and habits .    Intervention:  Nutrition counseling provided. Dietitian provided education regarding balanced nutrition and the importance of getting in regular meals/not skipping meals. Dietitian provided education on mindful eating and encouraged pt to put away electronics at mealtimes. Discussed how dietary supplements are not regulated like medications. Recommended pt continue multi-vitamin and could continue cod liver oil supplement, but that others are unnecessary. Discussed the importance of focusing on overall health-balanced nutrition and regular activity rather than focusing on weight as weight does not necessarily indicate health or lack of health. Dietitian discussed the major lifestyle changes required for those that undergo bariatric surgery. Discussed that bariatric surgery is not a simple weight loss solution, but requires major lifestyle changes. Reiterated how marijuana acts as an appetite stimulant as pt's doctor also discussed with him previously. Encouraged pt to consider getting a referral from his doctor to see a counselor to help with depression. Encouraged pt to  increase his activity to at least 150 minutes per week. Pt appeared agreeable to information/goals discussed.   Goals:   Make sure to get in three meals per day to ensure your body is getting in the nutrients it needs and to promote healthy metabolism. Try to have balanced meals like the plate example.   Try to be mindful with eating-try to put away electronics  at meal times to promote listening to your body's hunger and satiety cues.   Continue getting regular physical activity-try to get in at least 150 minutes of activity per week or 30 minutes at least 5 days per week.   Continue including more water and less sweetened beverages.   Teaching Method Utilized:  Visual Auditory  Handouts given during visit include:  Balanced nutrition and food list.   Barriers to learning/adherence to lifestyle change: None indicated.   Demonstrated degree of understanding via:  Teach Back   Monitoring/Evaluation:  Dietary intake, exercise, and body weight in 1 month(s).

## 2017-04-30 NOTE — Patient Instructions (Addendum)
Make sure to get in three meals per day to ensure your body is getting in the nutrients it needs and to promote healthy metabolism. Try to have balanced meals like the plate example.   Try to be mindful with eating-try to put away electronics at meal times to promote listening to your body's hunger and satiety cues.   Continue getting regular physical activity-try to get in at least 150 minutes of activity per week or 30 minutes at least 5 days per week.   Continue including more water and less sweetened beverages.

## 2017-05-01 DIAGNOSIS — M546 Pain in thoracic spine: Secondary | ICD-10-CM | POA: Diagnosis not present

## 2017-05-01 DIAGNOSIS — M9901 Segmental and somatic dysfunction of cervical region: Secondary | ICD-10-CM | POA: Diagnosis not present

## 2017-05-01 DIAGNOSIS — M542 Cervicalgia: Secondary | ICD-10-CM | POA: Diagnosis not present

## 2017-05-01 DIAGNOSIS — M9902 Segmental and somatic dysfunction of thoracic region: Secondary | ICD-10-CM | POA: Diagnosis not present

## 2017-05-14 ENCOUNTER — Encounter: Payer: Self-pay | Admitting: Internal Medicine

## 2017-05-14 ENCOUNTER — Other Ambulatory Visit: Payer: BLUE CROSS/BLUE SHIELD

## 2017-05-14 ENCOUNTER — Ambulatory Visit (INDEPENDENT_AMBULATORY_CARE_PROVIDER_SITE_OTHER): Payer: BLUE CROSS/BLUE SHIELD | Admitting: Internal Medicine

## 2017-05-14 VITALS — BP 138/80 | HR 82 | Temp 98.1°F | Ht 72.0 in | Wt 273.0 lb

## 2017-05-14 DIAGNOSIS — Z79899 Other long term (current) drug therapy: Secondary | ICD-10-CM

## 2017-05-14 DIAGNOSIS — Z5181 Encounter for therapeutic drug level monitoring: Secondary | ICD-10-CM

## 2017-05-14 DIAGNOSIS — F900 Attention-deficit hyperactivity disorder, predominantly inattentive type: Secondary | ICD-10-CM | POA: Diagnosis not present

## 2017-05-14 DIAGNOSIS — Z23 Encounter for immunization: Secondary | ICD-10-CM

## 2017-05-14 MED ORDER — BUPROPION HCL ER (XL) 150 MG PO TB24: 150.0000 mg | ORAL_TABLET | Freq: Every day | ORAL | 1 refills | Status: DC

## 2017-05-14 NOTE — Patient Instructions (Signed)

## 2017-05-14 NOTE — Progress Notes (Signed)
Subjective:  Patient ID: Cory Little, male    DOB: 02/09/1986  Age: 31 y.o. MRN: [id number]  CC: Depression   HPI Cory Little presents for f/up - He has had a good response to Adderall for the ADHD but he complains of apathy, anhedonia, fatigue, and excessive somnolence. He has heard good things about Wellbutrin and wants to know if he can try it.  Outpatient Medications Prior to Visit  Medication Sig Dispense Refill  . amphetamine-dextroamphetamine (ADDERALL) 5 MG tablet Take 1 tablet (5 mg total) by mouth 2 (two) times daily with a meal. 60 tablet 0  . amphetamine-dextroamphetamine (ADDERALL) 5 MG tablet Take 1 tablet (5 mg total) by mouth 2 (two) times daily with a meal. 60 tablet 0  . amphetamine-dextroamphetamine (ADDERALL) 5 MG tablet Take 1 tablet (5 mg total) by mouth 2 (two) times daily with a meal. 60 tablet 0  . ASPIRIN PO Take by mouth.    . COD LIVER OIL PO Take by mouth.    . Coenzyme Q10 (COQ10 PO) Take by mouth.    . [facility] MELATONIN PO Take by mouth.    . Ginkgo Biloba (GINKOBA PO) Take by mouth.    . LUTEIN PO Take by mouth.    . LYSINE PO Take by mouth.    [name]. MAGNESIUM PO Take by mouth.    . Multiple Vitamins-Minerals (CENTRUM ADULTS PO) Take by mouth.     No facility-administered medications prior to visit.     ROS Review of Systems  Constitutional: Negative for fatigue and unexpected weight change.  Psychiatric/Behavioral: Positive for decreased concentration and dysphoric mood. Negative for agitation, behavioral problems, confusion, hallucinations, self-injury, sleep disturbance and suicidal ideas. The patient is not nervous/anxious and is not hyperactive.   All other systems reviewed and are negative.   Objective:  BP 138/80 (BP Location: Left Arm, Patient Position: Sitting, Cuff Size: Large)   Pulse 82   Temp 98.1 F (36.7 C) (Oral)   Ht 6' (1.829 m)   Wt 273 lb (123.8 kg)   SpO2 96%   BMI 37.03 kg/m   BP Readings from Last 3 Encounters:    05/14/17 138/80  04/16/17 132/88  12/29/16 (!) 143/100    Wt Readings from Last 3 Encounters:  05/14/17 273 lb (123.8 kg)  04/30/17 272 lb 4 oz (123.5 kg)  04/16/17 279 lb 6.4 oz (126.7 kg)    Physical Exam  Psychiatric: He has a normal mood and affect. His speech is normal and behavior is normal. Judgment and thought content normal. His mood appears not anxious. He is not agitated, not aggressive, not slowed and not withdrawn. Cognition and memory are normal. He does not exhibit a depressed mood. He expresses no homicidal and no suicidal ideation. He expresses no suicidal plans and no homicidal plans. He is attentive.    Lab Results  Component Value Date   WBC 7.6 04/16/2017   HGB 15.2 04/16/2017   HCT 44.1 04/16/2017   PLT 230.0 04/16/2017   GLUCOSE 104 (H) 04/16/2017   CHOL 235 (H) 04/16/2017   TRIG 281.0 (H) 04/16/2017   HDL 41.60 04/16/2017   LDLDIRECT 155.0 04/16/2017   ALT 49 11/23/2015   AST 24 11/23/2015   NA 139 04/16/2017   K 3.5 04/16/2017   CL 106 04/16/2017   CREATININE 1.08 04/16/2017   BUN 14 04/16/2017   CO2 27 04/16/2017   TSH 1.32 11/23/2015   HGBA1C 5.5 04/16/2017    No results  found.  Assessment & Plan:   Kayvion was seen today for depression.  Diagnoses and all orders for this visit:  Encounter for monitoring stimulant therapy- I will screen for compliance and substance abuse -     Pain Mgmt, Profile 8 w/Conf, U; Future  Attention deficit hyperactivity disorder (ADHD), predominantly inattentive type- will continue Adderall at the current dose and will add bupropion to see if it helps with the other symptoms. -     Pain Mgmt, Profile 8 w/Conf, U; Future -     buPROPion (WELLBUTRIN XL) 150 MG 24 hr tablet; Take 1 tablet (150 mg total) by mouth daily.  Need for immunization against viral hepatitis -     Hepatitis A hepatitis B combined vaccine IM   I am having [name] start on buPROPion. I am also having him maintain his  amphetamine-dextroamphetamine, amphetamine-dextroamphetamine, amphetamine-dextroamphetamine, Multiple Vitamins-Minerals (CENTRUM ADULTS PO), Coenzyme Q10 (COQ10 PO), LYSINE PO, [facility] MELATONIN PO, COD LIVER OIL PO, Ginkgo Biloba (GINKOBA PO), LUTEIN PO, ASPIRIN PO, and MAGNESIUM PO.  Meds ordered this encounter  Medications  . buPROPion (WELLBUTRIN XL) 150 MG 24 hr tablet    Sig: Take 1 tablet (150 mg total) by mouth daily.    Dispense:  90 tablet    Refill:  1     Follow-up: Return in about 3 months (around 08/14/2017).  Sanda Linger, MD

## 2017-05-30 ENCOUNTER — Encounter: Payer: BLUE CROSS/BLUE SHIELD | Attending: Internal Medicine | Admitting: Registered"

## 2017-05-30 ENCOUNTER — Encounter: Payer: Self-pay | Admitting: Registered"

## 2017-05-30 DIAGNOSIS — G4733 Obstructive sleep apnea (adult) (pediatric): Secondary | ICD-10-CM | POA: Diagnosis not present

## 2017-05-30 DIAGNOSIS — Z713 Dietary counseling and surveillance: Secondary | ICD-10-CM | POA: Diagnosis present

## 2017-05-30 NOTE — Patient Instructions (Addendum)
Continue working to get in three meals per day and trying to include more non-starchy vegetables and whole grains.   Try to be mindful with eating-try to put away electronics at meal times to promote listening to your body's hunger and satiety cues.   Make a plan to continue being physically active during cold weather-could go ahead and join a gym and/or make a plan to go to an indoor place and walk regularly each week. Goal: at least 30 minutes 5 days per week/150 minutes per week.   Continue including more water and less sweetened beverages. With sweet tea recommend trying half sweet, half unsweet tea.   For snacks-try to select healthier snacks that will provide more nutrition and satisfaction (see handout).

## 2017-05-30 NOTE — Progress Notes (Signed)
Medical Nutrition Therapy:  Appt start time: 0935 end time:  1005.   Assessment:  Primary concerns today: Pt referred for weight management. Pt reports he has cut out sodas since last visit and he feels better since making that change. He is still drinking sweet tea as cutting down on it has been difficult per pt. Pt reports he has also cut back on his marijuana use. Pt reports he is trying to prepare foods at home more often and eating breakfast/3 meals per day more regularly. He reports he is now getting in 3 meals a day about 4 days out of the week. He says he has been trying to have more balanced meals. He reports he has still been eating in front of the TV. Pt reports he has traded whole milk for soy milk. He says he hasn't been as physically active lately since he went on vacation in October. He did start back walking this week, but reports he knows walking outdoors will be a struggle as the weather continues to get colder. He says he has thought about joining a [employer] close to his residence.  Preferred Learning Style:  No preference indicated   Learning Readiness:  Ready  MEDICATIONS: See list.    DIETARY INTAKE:  Usual eating pattern includes 2-3 meals and maybe 0-1 snack per day. Meals at home are usually eaten in living room with TV, phone present.    Everyday foods vary.  Avoided foods include none.     24-hr recall:  B ( AM): 1 egg and cheese breakfast burrito, soy milk   Snk ( AM): None reported.  L (3 PM): something from [employer]-pt unsure  Snk ( PM): None reported.  D (6-7 PM): [employer] Bowl  Snk ( PM): 2 large Little Debbie oatmeal cookies   Beverages: 2-3 glasses of water, soy milk, orange juice, sweet iced tea  Usual physical activity: Pt walked 3 days  for about 1 hour this week, but has not been walking regularly.   Progress Towards Goal(s):  In progress.   Nutritional Diagnosis:  NI-5.11.1 Predicted suboptimal nutrient intake As related to skipping  meals and unbalanced diet low in vegetables and fruits .  As evidenced by pt's reported dietary recall and habits .    Intervention:  Nutrition counseling provided. Praised pt's progress with cutting out sodas and working on having regular meals. Encouraged pt to continue with working on having 3 meals every day and trying to incorporate more vegetables and fruits. Encouraged pt try to have at least 2 meals per week without electronics present as a start to see how eating without distractions makes him feel. Recommended pt go ahead and set up a plan for including regular physical activity during the winter months. Reiterated how marijuana acts as an appetite stimulant. Encouraged choosing foods that provide better nutrition and satisfaction for snacks such as those with a good source of protein. Recommended pt try half sweet, half unsweet tea to cut back on sweet tea intake and to continue trying to include more water. Discussed strategies for snacking due to boredom. Pt appeared agreeable to information/goals discussed.   Goals:   Continue working to get in three meals per day and trying to include more non-starchy vegetables and whole grains.   Try to be mindful with eating-try to put away electronics at meal times to promote listening to your body's hunger and satiety cues.   Make a plan to continue being physically active during cold weather-could  go ahead and join a gym and/or make a plan to go to an indoor place and walk regularly each week. Goal: at least 30 minutes 5 days per week/150 minutes per week.   Continue including more water and less sweetened beverages. With sweet tea recommend trying half sweet, half unsweet tea.   For snacks-try to select healthier snacks that will provide more nutrition and satisfaction (see handout).   Teaching Method Utilized:  Visual Auditory  Handouts given during visit include:  Healthy Snack List   Barriers to learning/adherence to lifestyle change:  None indicated.   Demonstrated degree of understanding via:  Teach Back   Monitoring/Evaluation:  Dietary intake, exercise, and body weight in 1 month(s).

## 2017-06-16 ENCOUNTER — Ambulatory Visit (INDEPENDENT_AMBULATORY_CARE_PROVIDER_SITE_OTHER): Payer: BLUE CROSS/BLUE SHIELD

## 2017-06-16 DIAGNOSIS — Z23 Encounter for immunization: Secondary | ICD-10-CM

## 2017-06-16 DIAGNOSIS — Z299 Encounter for prophylactic measures, unspecified: Secondary | ICD-10-CM

## 2017-06-30 ENCOUNTER — Ambulatory Visit: Payer: BLUE CROSS/BLUE SHIELD | Admitting: Registered"

## 2017-07-09 DIAGNOSIS — G4733 Obstructive sleep apnea (adult) (pediatric): Secondary | ICD-10-CM | POA: Diagnosis not present

## 2017-07-09 DIAGNOSIS — E669 Obesity, unspecified: Secondary | ICD-10-CM | POA: Diagnosis not present

## 2017-08-06 DIAGNOSIS — M9901 Segmental and somatic dysfunction of cervical region: Secondary | ICD-10-CM | POA: Diagnosis not present

## 2017-08-06 DIAGNOSIS — M9902 Segmental and somatic dysfunction of thoracic region: Secondary | ICD-10-CM | POA: Diagnosis not present

## 2017-08-06 DIAGNOSIS — M542 Cervicalgia: Secondary | ICD-10-CM | POA: Diagnosis not present

## 2017-08-06 DIAGNOSIS — M546 Pain in thoracic spine: Secondary | ICD-10-CM | POA: Diagnosis not present

## 2017-08-11 ENCOUNTER — Ambulatory Visit (INDEPENDENT_AMBULATORY_CARE_PROVIDER_SITE_OTHER): Payer: BLUE CROSS/BLUE SHIELD

## 2017-08-11 ENCOUNTER — Ambulatory Visit: Payer: BLUE CROSS/BLUE SHIELD

## 2017-08-11 DIAGNOSIS — Z23 Encounter for immunization: Secondary | ICD-10-CM | POA: Diagnosis not present

## 2017-08-11 DIAGNOSIS — Z299 Encounter for prophylactic measures, unspecified: Secondary | ICD-10-CM

## 2017-08-14 ENCOUNTER — Encounter: Payer: Self-pay | Admitting: Internal Medicine

## 2017-08-14 ENCOUNTER — Other Ambulatory Visit (INDEPENDENT_AMBULATORY_CARE_PROVIDER_SITE_OTHER): Payer: BLUE CROSS/BLUE SHIELD

## 2017-08-14 ENCOUNTER — Ambulatory Visit (INDEPENDENT_AMBULATORY_CARE_PROVIDER_SITE_OTHER): Payer: BLUE CROSS/BLUE SHIELD | Admitting: Internal Medicine

## 2017-08-14 VITALS — BP 152/98 | HR 72 | Temp 98.3°F | Resp 16 | Ht 72.0 in | Wt 275.0 lb

## 2017-08-14 DIAGNOSIS — Z6837 Body mass index (BMI) 37.0-37.9, adult: Secondary | ICD-10-CM | POA: Diagnosis not present

## 2017-08-14 DIAGNOSIS — I10 Essential (primary) hypertension: Secondary | ICD-10-CM

## 2017-08-14 DIAGNOSIS — I119 Hypertensive heart disease without heart failure: Secondary | ICD-10-CM | POA: Diagnosis not present

## 2017-08-14 LAB — BASIC METABOLIC PANEL
BUN: 15 mg/dL (ref 6–23)
CO2: 29 mEq/L (ref 19–32)
Calcium: 9.1 mg/dL (ref 8.4–10.5)
Chloride: 102 mEq/L (ref 96–112)
Creatinine, Ser: 1.13 mg/dL (ref 0.40–1.50)
GFR: 79.22 mL/min (ref >60.00)
Glucose, Bld: 82 mg/dL (ref 70–99)
Potassium: 3.6 mEq/L (ref 3.5–5.1)
Sodium: 138 mEq/L (ref 135–145)

## 2017-08-14 LAB — URINALYSIS, ROUTINE W REFLEX MICROSCOPIC
Bilirubin Urine: NEGATIVE
Leukocytes, UA: NEGATIVE
Nitrite: NEGATIVE
Specific Gravity, Urine: 1.015 (ref 1.000–1.030)
Total Protein, Urine: NEGATIVE
Urine Glucose: NEGATIVE
Urobilinogen, UA: 0.2 (ref 0.0–1.0)
WBC, UA: NONE SEEN (ref 0–?)
pH: 7 (ref 5.0–8.0)

## 2017-08-14 MED ORDER — IRBESARTAN-HYDROCHLOROTHIAZIDE 150-12.5 MG PO TABS: 1.0000 | ORAL_TABLET | Freq: Every day | ORAL | 0 refills | Status: DC

## 2017-08-14 NOTE — Patient Instructions (Signed)

## 2017-08-14 NOTE — Progress Notes (Signed)
Subjective:  Patient ID: Cory Little, male    DOB: [birth year]  Age: 32 y.o. MRN: [id number]  CC: Hypertension   HPI Cory Little presents for a blood pressure check.  He feels well and offers no complaints.  He is having trouble losing weight and wants to be referred for consideration of bariatric surgery.  His focus and concentration have improved on the combination of Wellbutrin and Adderall.  Outpatient Medications Prior to Visit  Medication Sig Dispense Refill  . ASPIRIN PO Take by mouth.    [name] buPROPion (WELLBUTRIN XL) 150 MG 24 hr tablet Take 1 tablet (150 mg total) by mouth daily. 90 tablet 1  . COD LIVER OIL PO Take by mouth.    . Coenzyme Q10 (COQ10 PO) Take by mouth.    . [employer] MELATONIN PO Take by mouth.    . Ginkgo Biloba (GINKOBA PO) Take by mouth.    . LUTEIN PO Take by mouth.    . LYSINE PO Take by mouth.    [name] MAGNESIUM PO Take by mouth.    . Multiple Vitamins-Minerals (CENTRUM ADULTS PO) Take by mouth.    [name] amphetamine-dextroamphetamine (ADDERALL) 5 MG tablet Take 1 tablet (5 mg total) by mouth 2 (two) times daily with a meal. 60 tablet 0  . amphetamine-dextroamphetamine (ADDERALL) 5 MG tablet Take 1 tablet (5 mg total) by mouth 2 (two) times daily with a meal. 60 tablet 0  . amphetamine-dextroamphetamine (ADDERALL) 5 MG tablet Take 1 tablet (5 mg total) by mouth 2 (two) times daily with a meal. 60 tablet 0   No facility-administered medications prior to visit.     ROS Review of Systems  Constitutional: Negative for diaphoresis and fatigue.  HENT: Negative.   Eyes: Negative.   Respiratory: Negative for cough, chest tightness, shortness of breath and wheezing.   Cardiovascular: Negative for chest pain, palpitations and leg swelling.  Gastrointestinal: Negative for abdominal pain, constipation, diarrhea, nausea and vomiting.  Endocrine: Negative.   Genitourinary: Negative.  Negative for difficulty urinating, dysuria and hematuria.  Musculoskeletal: Negative.   Negative for back pain and neck pain.  Skin: Negative.   Allergic/Immunologic: Negative.   Neurological: Negative.  Negative for dizziness, weakness, light-headedness, numbness and headaches.  Hematological: Negative for adenopathy. Does not bruise/bleed easily.  Psychiatric/Behavioral: Negative.  The patient is not nervous/anxious.     Objective:  BP (!) 152/98 (BP Location: Left Arm, Patient Position: Sitting, Cuff Size: Large)   Pulse 72   Temp 98.3 F (36.8 C) (Oral)   Ht 6' (1.829 m)   Wt 275 lb (124.7 kg)   SpO2 97%   BMI 37.30 kg/m   BP Readings from Last 3 Encounters:  08/14/17 (!) 152/98  05/14/17 138/80  04/16/17 132/88    Wt Readings from Last 3 Encounters:  08/14/17 275 lb (124.7 kg)  05/30/17 271 lb 7 oz (123.1 kg)  05/14/17 273 lb (123.8 kg)    Physical Exam  Constitutional: He is oriented to person, place, and time. No distress.  HENT:  Mouth/Throat: Oropharynx is clear and moist. No oropharyngeal exudate.  Eyes: Conjunctivae are normal. Left eye exhibits no discharge. No scleral icterus.  Neck: Normal range of motion. Neck supple. No JVD present. No thyromegaly present.  Cardiovascular: Normal rate and regular rhythm. Exam reveals no gallop.  No murmur heard. EKG- Sinus  Rhythm  -Horizontal axis for age.   Voltage criteria for LVH  (R(I)+S(III) exceeds 2.50 mV)  -Voltage criteria w/o ST/T abnormality  may be normal.   -  Diffuse nonspecific T-abnormality.   ABNORMAL   Pulmonary/Chest: Effort normal and breath sounds normal. No respiratory distress. He has no wheezes. He has no rales.  Abdominal: Soft. Bowel sounds are normal. He exhibits no mass. There is no tenderness. There is no guarding.  Musculoskeletal: Normal range of motion. He exhibits no edema or tenderness.  Lymphadenopathy:    He has no cervical adenopathy.  Neurological: He is alert and oriented to person, place, and time.  Skin: Skin is warm and dry. No rash noted. He is not  diaphoretic. No erythema. No pallor.  Psychiatric: He has a normal mood and affect. His behavior is normal. Judgment and thought content normal.  Vitals reviewed.   Lab Results  Component Value Date   WBC 7.6 04/16/2017   HGB 15.2 04/16/2017   HCT 44.1 04/16/2017   PLT 230.0 04/16/2017   GLUCOSE 82 08/14/2017   CHOL 235 (H) 04/16/2017   TRIG 281.0 (H) 04/16/2017   HDL 41.60 04/16/2017   LDLDIRECT 155.0 04/16/2017   ALT 49 11/23/2015   AST 24 11/23/2015   NA 138 08/14/2017   K 3.6 08/14/2017   CL 102 08/14/2017   CREATININE 1.13 08/14/2017   BUN 15 08/14/2017   CO2 29 08/14/2017   TSH 1.32 11/23/2015   HGBA1C 5.5 04/16/2017    No results found.  Assessment & Plan:   Cory Little was seen today for hypertension.  Diagnoses and all orders for this visit:  Essential hypertension- His blood pressure is not adequately well controlled.  I have told him he needs to stop taking Adderall.  His lab work is negative for any secondary causes or endorgan damage.  I have ordered a urine drug screen to see if there is illegal stimulant substance abuse.  Will start treating this with a combination of an ARB and a thiazide diuretic. -     Basic metabolic panel; Future -     Urinalysis, Routine w reflex microscopic; Future -     Urine drugs of abuse scrn w alc, routine (Ref Lab); Future -     EKG 12-Lead -     irbesartan-hydrochlorothiazide (AVALIDE) 150-12.5 MG tablet; Take 1 tablet by mouth daily.  Hypertensive left ventricular hypertrophy, without heart failure- as above, there is no evidence of fluid overload -     irbesartan-hydrochlorothiazide (AVALIDE) 150-12.5 MG tablet; Take 1 tablet by mouth daily.  Class 2 severe obesity due to excess calories with serious comorbidity and body mass index (BMI) of 37.0 to 37.9 in adult [facility](HCC) -     Ambulatory referral to General Surgery   I have discontinued Cory Little's amphetamine-dextroamphetamine, amphetamine-dextroamphetamine, and  amphetamine-dextroamphetamine. I am also having him start on irbesartan-hydrochlorothiazide. Additionally, I am having him maintain his Multiple Vitamins-Minerals (CENTRUM ADULTS PO), Coenzyme Q10 (COQ10 PO), LYSINE PO, CVS MELATONIN PO, COD LIVER OIL PO, Ginkgo Biloba (GINKOBA PO), LUTEIN PO, ASPIRIN PO, MAGNESIUM PO, and buPROPion.  Meds ordered this encounter  Medications  . irbesartan-hydrochlorothiazide (AVALIDE) 150-12.5 MG tablet    Sig: Take 1 tablet by mouth daily.    Dispense:  90 tablet    Refill:  0     Follow-up: Return in about 6 weeks (around 09/25/2017).  Sanda Lingerhomas Quenna Doepke, MD

## 2017-08-18 ENCOUNTER — Telehealth: Payer: Self-pay

## 2017-08-18 ENCOUNTER — Other Ambulatory Visit: Payer: Self-pay | Admitting: Internal Medicine

## 2017-08-18 DIAGNOSIS — I10 Essential (primary) hypertension: Secondary | ICD-10-CM

## 2017-08-18 DIAGNOSIS — I119 Hypertensive heart disease without heart failure: Secondary | ICD-10-CM

## 2017-08-18 MED ORDER — TELMISARTAN-HCTZ 80-12.5 MG PO TABS: 1.0000 | ORAL_TABLET | Freq: Every day | ORAL | 1 refills | Status: DC

## 2017-08-18 NOTE — Telephone Encounter (Signed)
Pt informed new rx has been sent.  

## 2017-08-18 NOTE — Telephone Encounter (Signed)
done

## 2017-08-18 NOTE — Telephone Encounter (Signed)
[facility] pharmacy sent communication that rx for irbesartan/HCTZ is on back order. Is there an alternative that can be sent in?

## 2017-08-21 ENCOUNTER — Other Ambulatory Visit: Payer: Self-pay | Admitting: Internal Medicine

## 2017-08-21 DIAGNOSIS — F1210 Cannabis abuse, uncomplicated: Secondary | ICD-10-CM | POA: Insufficient documentation

## 2017-08-21 LAB — URINE DRUGS OF ABUSE SCREEN W ALC, ROUTINE (REF LAB)
ALCOHOL, ETHYL (U): NEGATIVE
AMPHETAMINES (1000 ng/mL SCRN): NEGATIVE
BARBITURATES: NEGATIVE
BENZODIAZEPINES: NEGATIVE
COCAINE METABOLITES: NEGATIVE
MARIJUANA MET (50 ng/mL SCRN): POSITIVE — AB
METHADONE: NEGATIVE
METHAQUALONE: NEGATIVE
OPIATES: NEGATIVE
PHENCYCLIDINE: NEGATIVE
PROPOXYPHENE: NEGATIVE

## 2017-08-28 ENCOUNTER — Encounter (INDEPENDENT_AMBULATORY_CARE_PROVIDER_SITE_OTHER): Payer: BLUE CROSS/BLUE SHIELD

## 2017-09-08 ENCOUNTER — Ambulatory Visit (INDEPENDENT_AMBULATORY_CARE_PROVIDER_SITE_OTHER): Payer: BLUE CROSS/BLUE SHIELD | Admitting: Family Medicine

## 2017-09-08 ENCOUNTER — Encounter (INDEPENDENT_AMBULATORY_CARE_PROVIDER_SITE_OTHER): Payer: Self-pay

## 2017-09-08 ENCOUNTER — Encounter (INDEPENDENT_AMBULATORY_CARE_PROVIDER_SITE_OTHER): Payer: Self-pay | Admitting: Family Medicine

## 2017-09-08 VITALS — BP 132/89 | HR 69 | Temp 97.8°F | Ht 71.0 in | Wt 267.0 lb

## 2017-09-08 DIAGNOSIS — I10 Essential (primary) hypertension: Secondary | ICD-10-CM | POA: Diagnosis not present

## 2017-09-08 DIAGNOSIS — G4733 Obstructive sleep apnea (adult) (pediatric): Secondary | ICD-10-CM

## 2017-09-08 DIAGNOSIS — Z9189 Other specified personal risk factors, not elsewhere classified: Secondary | ICD-10-CM | POA: Diagnosis not present

## 2017-09-08 DIAGNOSIS — Z6837 Body mass index (BMI) 37.0-37.9, adult: Secondary | ICD-10-CM

## 2017-09-08 DIAGNOSIS — Z0289 Encounter for other administrative examinations: Secondary | ICD-10-CM

## 2017-09-08 DIAGNOSIS — Z1331 Encounter for screening for depression: Secondary | ICD-10-CM

## 2017-09-08 NOTE — Progress Notes (Signed)
Office: [phone]  /  Fax: [phone]   Dear Dr. Yetta Barre,   Thank you for referring Cory Little to our clinic. The following note includes my evaluation and treatment recommendations.  HPI:   Chief Complaint: OBESITY    ISSAIAH SEABROOKS has been referred by Etta Grandchild, MD for consultation regarding his obesity and obesity related comorbidities.    SHRAVAN SALAHUDDIN (MR# [id number]) is a 32 y.o. male who presents on 09/08/2017 for obesity evaluation and treatment. Current BMI is Body mass index is 37.24 kg/m.[name] Kenechukwu has been struggling with his weight for many years and has been unsuccessful in either losing weight, maintaining weight loss, or reaching his healthy weight goal.     Beulah Gandy attended our information session and states he is currently in the action stage of change and ready to dedicate time achieving and maintaining a healthier weight. Kyson is interested in becoming our patient and working on intensive lifestyle modifications including (but not limited to) diet, exercise and weight loss.    Durwood is eager to lose weight but is looking for an "easy fix". He is interested in weight loss surgery but has not looked into this yet. He requests a weight loss pill. He is eager to lose weight quickly.    Johnte states his family eats meals together he thinks his family will eat healthier with  him his desired weight loss is 42 lbs his heaviest weight ever was 285 lbs. he has significant food cravings issues  he snacks frequently in the evenings he is frequently drinking liquids with calories he frequently makes poor food choices he has problems with excessive hunger  he struggles with emotional eating    Fatigue Holly feels his energy is lower than it should be. This has worsened with weight gain and has not worsened recently. Jodey admits to daytime somnolence and  admits to waking up still tired. Patient has a history of sleep apnea and on CPAP. Patent has  a history of symptoms of daytime somnolence. Patient generally gets 6 hours of sleep per night, and states they generally have generally restful sleep. Snoring is present. Apneic episodes are present. Epworth Sleepiness Score is 7.  Dyspnea on exertion Beulah Gandy notes increasing shortness of breath with exercising and seems to be worsening over time with weight gain. He notes getting out of breath sooner with activity than he used to. This has not gotten worse recently. Allister denies orthopnea.  Hypertension ACEYN KATHOL is a 32 y.o. male with hypertension. Delwin recently started blood pressure medications and blood pressure is controlled today. He is now off ADHD medications per Dr. Yetta Barre. He denies chest pain or headache. He has a history of sleep apnea. He is working weight loss to help control his blood pressure with the goal of decreasing his risk of heart attack and stroke.  At risk for cardiovascular disease Tobenna is at a higher than average risk for cardiovascular disease due to obesity and hypertension. He currently denies any chest pain.  Sleep Apnea Randle is on CPAP, he wants surgery to remove tonsils as he feels this would help his sleep apnea but ENT disagrees.  Depression Screen Jaysten's Food and Mood (modified PHQ-9) score was  Depression screen PHQ 2/9 09/08/2017  Decreased Interest 3  Down, Depressed, Hopeless 3  PHQ - 2 Score 6  Altered sleeping 3  Tired, decreased energy 3  Change in appetite 3  Feeling bad or failure about yourself  3  Trouble  concentrating 3  Moving slowly or fidgety/restless 3  Suicidal thoughts 3  PHQ-9 Score 27  Difficult doing work/chores Extremely dIfficult    ALLERGIES: No Known Allergies  MEDICATIONS: Current Outpatient Medications on File Prior to Visit  Medication Sig Dispense Refill  . ASPIRIN PO Take by mouth.    [name] buPROPion (WELLBUTRIN XL) 150 MG 24 hr tablet Take 1 tablet (150 mg total) by mouth daily. 90 tablet 1  .  COD LIVER OIL PO Take by mouth.    . Coenzyme Q10 (COQ10 PO) Take by mouth.    . CVS MELATONIN PO Take by mouth.    . Ginkgo Biloba (GINKOBA PO) Take by mouth.    . LYSINE PO Take by mouth.    [name] MAGNESIUM PO Take by mouth.    . Multiple Vitamins-Minerals (CENTRUM ADULTS PO) Take by mouth.    . telmisartan-hydrochlorothiazide (MICARDIS HCT) 80-12.5 MG tablet Take 1 tablet by mouth daily. 90 tablet 1   No current facility-administered medications on file prior to visit.     PAST MEDICAL HISTORY: Past Medical History:  Diagnosis Date  . ADD (attention deficit disorder)   . Chest pain   . Chest tightness   . Depression   . GERD (gastroesophageal reflux disease)   . Hypertension   . Intolerance to cold   . Kidney stone 2004  . Sleep apnea     PAST SURGICAL HISTORY: Past Surgical History:  Procedure Laterality Date  . facial liposuction    . WISDOM TOOTH EXTRACTION  2016    SOCIAL HISTORY: Social History   Tobacco Use  . Smoking status: Former Smoker    Types: Cigarettes  . Smokeless tobacco: Never Used  Substance Use Topics  . Alcohol use: No    Comment: socially  . Drug use: Yes    Types: Marijuana    FAMILY HISTORY: Family History  Problem Relation Age of Onset  . Sleep apnea Father   . Depression Mother     ROS: Review of Systems  Constitutional: Positive for malaise/fatigue. Negative for weight loss.       + hot/cold intolerance  Respiratory: Positive for shortness of breath (with exertion).   Cardiovascular: Negative for chest pain and orthopnea.       + chest pain/discomfort + chest tightness  Gastrointestinal: Positive for heartburn.  Neurological: Negative for headaches.  Psychiatric/Behavioral: Positive for depression. Negative for suicidal ideas.    PHYSICAL EXAM: Blood pressure 132/89, pulse 69, temperature 97.8 F (36.6 C), temperature source Oral, height 5\' 11"  (1.803 m), weight 267 lb (121.1 kg), SpO2 97 %. Body mass index is 37.24  kg/m. Physical Exam  Constitutional: He is oriented to person, place, and time. He appears well-developed and well-nourished.  HENT:  Head: Normocephalic and atraumatic.  Nose: Nose normal.  Eyes: EOM are normal. No scleral icterus.  Neck: Normal range of motion. Neck supple. No thyromegaly present.  Cardiovascular: Normal rate and regular rhythm.  Pulmonary/Chest: Effort normal. No respiratory distress.  Abdominal: Soft. There is no tenderness.  + Obesity  Musculoskeletal:  Range of Motion normal in all 4 extremities Trace edema noted in bilateral lower extremities  Neurological: He is alert and oriented to person, place, and time. Coordination normal.  Skin: Skin is warm and dry.  Psychiatric: He has a normal mood and affect. His behavior is normal.  Vitals reviewed.   RECENT LABS AND TESTS: BMET    Component Value Date/Time   NA 138 08/14/2017 1601   K  3.6 08/14/2017 1601   CL 102 08/14/2017 1601   CO2 29 08/14/2017 1601   GLUCOSE 82 08/14/2017 1601   BUN 15 08/14/2017 1601   CREATININE 1.13 08/14/2017 1601   CALCIUM 9.1 08/14/2017 1601   GFRNONAA >60 12/21/2016 2026   GFRAA >60 12/21/2016 2026   Lab Results  Component Value Date   HGBA1C 5.5 04/16/2017   No results found for: INSULIN CBC    Component Value Date/Time   WBC 7.6 04/16/2017 1404   RBC 5.02 04/16/2017 1404   HGB 15.2 04/16/2017 1404   HCT 44.1 04/16/2017 1404   PLT 230.0 04/16/2017 1404   MCV 88.0 04/16/2017 1404   MCH 30.2 12/21/2016 2026   MCHC 34.5 04/16/2017 1404   RDW 13.1 04/16/2017 1404   LYMPHSABS 2.6 04/16/2017 1404   MONOABS 0.6 04/16/2017 1404   EOSABS 0.1 04/16/2017 1404   BASOSABS 0.1 04/16/2017 1404   Iron/TIBC/Ferritin/ %Sat No results found for: IRON, TIBC, FERRITIN, IRONPCTSAT Lipid Panel     Component Value Date/Time   CHOL 235 (H) 04/16/2017 1404   TRIG 281.0 (H) 04/16/2017 1404   HDL 41.60 04/16/2017 1404   CHOLHDL 6 04/16/2017 1404   VLDL 56.2 (H) 04/16/2017 1404    LDLDIRECT 155.0 04/16/2017 1404   Hepatic Function Panel     Component Value Date/Time   PROT 7.2 11/23/2015 1624   ALBUMIN 4.4 11/23/2015 1624   AST 24 11/23/2015 1624   ALT 49 11/23/2015 1624   ALKPHOS 66 11/23/2015 1624   BILITOT 0.6 11/23/2015 1624      Component Value Date/Time   TSH 1.32 11/23/2015 1624   TSH 0.89 02/16/2009 0000    ECG  shows NSR with a rate of 76 BPM INDIRECT CALORIMETER done today shows a VO2 of 294 and a REE of 2044.  His calculated basal metabolic rate is 9147 thus his basal metabolic rate is worse than expected.    ASSESSMENT AND PLAN: Essential hypertension - Plan: Vitamin B12, CBC With Differential, Folate, Hemoglobin A1c, Insulin, random, Lipid Panel With LDL/HDL Ratio, T3, T4, free, TSH, VITAMIN D 25 Hydroxy (Vit-D Deficiency, Fractures), Comprehensive metabolic panel  Obstructive sleep apnea syndrome - Plan: Comprehensive metabolic panel  Depression screening  At risk for heart disease  Class 2 severe obesity with serious comorbidity and body mass index (BMI) of 37.0 to 37.9 in adult, unspecified obesity type (HCC)  PLAN:  Fatigue Shravan was informed that his fatigue may be related to obesity, depression or many other causes. Labs will be ordered, and in the meanwhile Arliss has agreed to work on diet, exercise and weight loss to help with fatigue. Proper sleep hygiene was discussed including the need for 7-8 hours of quality sleep each night. A sleep study was not ordered based on symptoms and Epworth score.  Dyspnea on exertion Percy's shortness of breath appears to be obesity related and exercise induced. He has agreed to work on weight loss and gradually increase exercise to treat his exercise induced shortness of breath. If Cree follows our instructions and loses weight without improvement of his shortness of breath, we will plan to refer to pulmonology. We will monitor this condition regularly. Caleel agrees to this  plan.  Hypertension We discussed sodium restriction, working on healthy weight loss, and a regular exercise program as the means to achieve improved blood pressure control. Nicoli agreed with this plan and agreed to follow up as directed. We will continue to monitor his blood pressure as well as his  progress with the above lifestyle modifications. Pt goal is to control blood pressure with medications, continue diet and exercise, and will watch for signs of hypotension as he continues his lifestyle modifications. Beulah GandyRudolph agrees to follow up with our clinic in 2 weeks.  Cardiovascular risk counselling Beulah GandyRudolph was given extended (15 minutes) coronary artery disease prevention counseling today. He is 32 y.o. male and has risk factors for heart disease including obesity and hypertension. We discussed intensive lifestyle modifications today with an emphasis on specific weight loss instructions and strategies. Pt was also informed of the importance of increasing exercise and decreasing saturated fats to help prevent heart disease.  Sleep Apnea Beulah GandyRudolph was encouraged to continue CPAP and work on weight loss. Beulah GandyRudolph agrees to follow up with our clinic in 2 weeks.  Depression Screen Beulah GandyRudolph had a strongly positive depression screening. Depression is commonly associated with obesity and often results in emotional eating behaviors. We will monitor this closely and work on CBT to help improve the non-hunger eating patterns. Referral to Psychology may be required if no improvement is seen as he continues in our clinic.  Obesity Beulah GandyRudolph is currently in the action stage of change and his goal is to continue with weight loss efforts. I recommend Beulah GandyRudolph begin the structured treatment plan as follows:  He has agreed to follow the Category 3 plan + 300 calories Beulah GandyRudolph has been instructed to eventually work up to a goal of 150 minutes of combined cardio and strengthening exercise per week for weight loss and  overall health benefits. We discussed the following Behavioral Modification Strategies today: increasing lean protein intake, decreasing simple carbohydrates  and decrease eating out Beulah Gandyudolph referred to Dr. Andrey CampanileWilson ([facility]) for weight loss surgery. Pt advised he must keep his BMI above 35 to qualify for weight loss surgery, assuming his severe sleep apnea will be considered a serious medical issue by his insurance company.     He was informed of the importance of frequent follow up visits to maximize his success with intensive lifestyle modifications for his multiple health conditions. He was informed we would discuss his lab results at his next visit unless there is a critical issue that needs to be addressed sooner. Beulah GandyRudolph agreed to keep his next visit at the agreed upon time to discuss these results.    OBESITY BEHAVIORAL INTERVENTION VISIT  Today's visit was # 1 out of 22.  Starting weight: 267 lbs Starting date: 09/08/17 Today's weight : 267 lbs  Today's date: 09/08/2017 Total lbs lost to date: 0 (Patients must lose 7 lbs in the first 6 months to continue with counseling)   ASK: We discussed the diagnosis of obesity with Cory Palingudolph L Lampron today and Beulah GandyRudolph agreed to give us permission to discuss obesity behavioral modification therapy today.  ASSESS: Beulah GandyRudolph has the diagnosis of obesity and his BMI today is 37.26 Beulah GandyRudolph is in the action stage of change   ADVISE: Beulah GandyRudolph was educated on the multiple health risks of obesity as well as the benefit of weight loss to improve his health. He was advised of the need for long term treatment and the importance of lifestyle modifications.  AGREE: Multiple dietary modification options and treatment options were discussed and  Beulah GandyRudolph agreed to the above obesity treatment plan.   I, Burt KnackSharon Martin, am acting as transcriptionist for  Quillian Quincearen Madysyn Hanken, MD  I have reviewed the above documentation for accuracy and completeness, and I agree with the  above. -Quillian Quincearen Yvana Samonte, MD

## 2017-09-09 LAB — CBC WITH DIFFERENTIAL
Basophils Absolute: 0.1 10*3/uL (ref 0.0–0.2)
Basos: 1 %
EOS (ABSOLUTE): 0.2 10*3/uL (ref 0.0–0.4)
Eos: 2 %
Hematocrit: 44.8 % (ref 37.5–51.0)
Hemoglobin: 15.6 g/dL (ref 13.0–17.7)
Immature Grans (Abs): 0 10*3/uL (ref 0.0–0.1)
Immature Granulocytes: 0 %
Lymphocytes Absolute: 2.4 10*3/uL (ref 0.7–3.1)
Lymphs: 35 %
MCH: 30.5 pg (ref 26.6–33.0)
MCHC: 34.8 g/dL (ref 31.5–35.7)
MCV: 88 fL (ref 79–97)
Monocytes Absolute: 0.6 10*3/uL (ref 0.1–0.9)
Monocytes: 8 %
Neutrophils Absolute: 3.5 10*3/uL (ref 1.4–7.0)
Neutrophils: 54 %
RBC: 5.12 x10E6/uL (ref 4.14–5.80)
RDW: 14 % (ref 12.3–15.4)
WBC: 6.6 10*3/uL (ref 3.4–10.8)

## 2017-09-09 LAB — FOLATE: Folate: 16.4 ng/mL (ref >3.0)

## 2017-09-09 LAB — LIPID PANEL WITH LDL/HDL RATIO
Cholesterol, Total: 217 mg/dL — ABNORMAL HIGH (ref 100–199)
HDL: 35 mg/dL — ABNORMAL LOW (ref >39)
LDL Calculated: 149 mg/dL — ABNORMAL HIGH (ref 0–99)
LDl/HDL Ratio: 4.3 ratio — ABNORMAL HIGH (ref 0.0–3.6)
Triglycerides: 165 mg/dL — ABNORMAL HIGH (ref 0–149)
VLDL Cholesterol Cal: 33 mg/dL (ref 5–40)

## 2017-09-09 LAB — COMPREHENSIVE METABOLIC PANEL
ALT: 40 IU/L (ref 0–44)
AST: 22 IU/L (ref 0–40)
Albumin/Globulin Ratio: 1.8 (ref 1.2–2.2)
Albumin: 4.4 g/dL (ref 3.5–5.5)
Alkaline Phosphatase: 65 IU/L (ref 39–117)
BUN/Creatinine Ratio: 17 (ref 9–20)
BUN: 17 mg/dL (ref 6–20)
Bilirubin Total: 0.6 mg/dL (ref 0.0–1.2)
CO2: 22 mmol/L (ref 20–29)
Calcium: 8.9 mg/dL (ref 8.7–10.2)
Chloride: 103 mmol/L (ref 96–106)
Creatinine, Ser: 0.99 mg/dL (ref 0.76–1.27)
GFR calc Af Amer: 115 mL/min/{1.73_m2} (ref >59)
GFR calc non Af Amer: 100 mL/min/{1.73_m2} (ref >59)
Globulin, Total: 2.4 g/dL (ref 1.5–4.5)
Glucose: 83 mg/dL (ref 65–99)
Potassium: 4.2 mmol/L (ref 3.5–5.2)
Sodium: 140 mmol/L (ref 134–144)
Total Protein: 6.8 g/dL (ref 6.0–8.5)

## 2017-09-09 LAB — T4, FREE: Free T4: 1.64 ng/dL (ref 0.82–1.77)

## 2017-09-09 LAB — VITAMIN D 25 HYDROXY (VIT D DEFICIENCY, FRACTURES): Vit D, 25-Hydroxy: 26.7 ng/mL — ABNORMAL LOW (ref 30.0–100.0)

## 2017-09-09 LAB — VITAMIN B12: Vitamin B-12: 676 pg/mL (ref 232–1245)

## 2017-09-09 LAB — HEMOGLOBIN A1C
Est. average glucose Bld gHb Est-mCnc: 108 mg/dL
Hgb A1c MFr Bld: 5.4 % (ref 4.8–5.6)

## 2017-09-09 LAB — T3: T3, Total: 135 ng/dL (ref 71–180)

## 2017-09-09 LAB — INSULIN, RANDOM: INSULIN: 17.8 u[IU]/mL (ref 2.6–24.9)

## 2017-09-09 LAB — TSH: TSH: 0.661 u[IU]/mL (ref 0.450–4.500)

## 2017-09-22 ENCOUNTER — Ambulatory Visit (INDEPENDENT_AMBULATORY_CARE_PROVIDER_SITE_OTHER): Payer: BLUE CROSS/BLUE SHIELD | Admitting: Family Medicine

## 2017-09-22 VITALS — BP 139/85 | HR 66 | Temp 97.9°F | Ht 71.0 in | Wt 270.0 lb

## 2017-09-22 DIAGNOSIS — E8881 Metabolic syndrome: Secondary | ICD-10-CM | POA: Diagnosis not present

## 2017-09-22 DIAGNOSIS — E559 Vitamin D deficiency, unspecified: Secondary | ICD-10-CM | POA: Diagnosis not present

## 2017-09-22 DIAGNOSIS — E7849 Other hyperlipidemia: Secondary | ICD-10-CM

## 2017-09-22 DIAGNOSIS — Z9189 Other specified personal risk factors, not elsewhere classified: Secondary | ICD-10-CM | POA: Diagnosis not present

## 2017-09-22 DIAGNOSIS — Z6837 Body mass index (BMI) 37.0-37.9, adult: Secondary | ICD-10-CM

## 2017-09-22 DIAGNOSIS — E88819 Insulin resistance, unspecified: Secondary | ICD-10-CM

## 2017-09-22 MED ORDER — METFORMIN HCL 500 MG PO TABS: 500.0000 mg | ORAL_TABLET | Freq: Every day | ORAL | 0 refills | Status: DC

## 2017-09-22 MED ORDER — VITAMIN D (ERGOCALCIFEROL) 1.25 MG (50000 UNIT) PO CAPS: 50000.0000 [IU] | ORAL_CAPSULE | ORAL | 0 refills | Status: DC

## 2017-09-22 NOTE — Progress Notes (Signed)
Office: [phone]  /  Fax: [phone]   HPI:   Chief Complaint: OBESITY Cory Little is here to discuss his progress with his obesity treatment plan. He is on the Category 3 plan and is following his eating plan approximately 40 % of the time. He states he is exercising at the gym and walking 45 to 60 minutes 4 times per week. Beulah Gandy didn't follow the meal plan closely, instead he tried to discontinue regular sodas and decrease his sweet tea intake. Ozzie is still eating out and is not planning meals ahead of time. He did go to weight loss seminar and thinks he wants the lap band. He requests a pill to help him lose weight. His weight is 270 lb (122.5 kg) today and has had a weight gain of 3 pounds over a period of 2 weeks since his last visit. He has gained 3 lbs since starting treatment with [name].  Vitamin D deficiency Luka has a new diagnosis of vitamin D deficiency. He is not currently taking vit D and admits fatigue but denies nausea, vomiting or muscle weakness.   Ref. Range 09/08/2017 13:39  Vitamin D, 25-Hydroxy Latest Ref Range: 30.0 - 100.0 ng/mL 26.7 (L)   Insulin Resistance Kentravious has a new diagnosis of insulin resistance based on his elevated fasting insulin level >5. Although Dorsel's blood glucose readings and A1c are still under good control, insulin resistance puts him at greater risk of metabolic syndrome and diabetes. He admits polyphagia. He is not taking metformin currently and continues to work on diet and exercise to decrease risk of diabetes.  At risk for diabetes Aleksander is at higher than average risk for developing diabetes due to his obesity. He currently denies polyuria or polydipsia.  Hyperlipidemia Taim has hyperlipidemia and is not on statin. He has been trying to improve his cholesterol levels with intensive lifestyle modification including a low saturated fat diet, exercise and weight loss. His levels are not at goal. He denies any chest pain,  claudication or myalgias.  ALLERGIES: No Known Allergies  MEDICATIONS: Current Outpatient Medications on File Prior to Visit  Medication Sig Dispense Refill  . ASPIRIN PO Take by mouth.    [name] buPROPion (WELLBUTRIN XL) 150 MG 24 hr tablet Take 1 tablet (150 mg total) by mouth daily. 90 tablet 1  . COD LIVER OIL PO Take by mouth.    . Coenzyme Q10 (COQ10 PO) Take by mouth.    . [facility] MELATONIN PO Take by mouth.    . Ginkgo Biloba (GINKOBA PO) Take by mouth.    . LYSINE PO Take by mouth.    [name] MAGNESIUM PO Take by mouth.    . Multiple Vitamins-Minerals (CENTRUM ADULTS PO) Take by mouth.    . telmisartan-hydrochlorothiazide (MICARDIS HCT) 80-12.5 MG tablet Take 1 tablet by mouth daily. 90 tablet 1   No current facility-administered medications on file prior to visit.     PAST MEDICAL HISTORY: Past Medical History:  Diagnosis Date  . ADD (attention deficit disorder)   . Chest pain   . Chest tightness   . Depression   . GERD (gastroesophageal reflux disease)   . Hypertension   . Intolerance to cold   . Kidney stone 2004  . Sleep apnea     PAST SURGICAL HISTORY: Past Surgical History:  Procedure Laterality Date  . facial liposuction    . WISDOM TOOTH EXTRACTION  2016    SOCIAL HISTORY: Social History   Tobacco Use  . Smoking status:  Former Smoker    Types: Cigarettes  . Smokeless tobacco: Never Used  Substance Use Topics  . Alcohol use: No    Comment: socially  . Drug use: Yes    Types: Marijuana    FAMILY HISTORY: Family History  Problem Relation Age of Onset  . Sleep apnea Father   . Depression Mother     ROS: Review of Systems  Constitutional: Positive for malaise/fatigue. Negative for weight loss.  Cardiovascular: Negative for chest pain and claudication.  Gastrointestinal: Negative for nausea and vomiting.  Musculoskeletal: Negative for myalgias.       Negative for muscle weakness  Endo/Heme/Allergies:       Positive for polyphagia    PHYSICAL  EXAM: Blood pressure 139/85, pulse 66, temperature 97.9 F (36.6 C), temperature source Oral, height 5\' 11"  (1.803 m), weight 270 lb (122.5 kg), SpO2 97 %. Body mass index is 37.66 kg/m. Physical Exam  Constitutional: He is oriented to person, place, and time. He appears well-developed and well-nourished.  Cardiovascular: Normal rate.  Pulmonary/Chest: Effort normal.  Musculoskeletal: Normal range of motion.  Neurological: He is oriented to person, place, and time.  Skin: Skin is warm and dry.  Psychiatric: He has a normal mood and affect. His behavior is normal.  Vitals reviewed.   RECENT LABS AND TESTS: BMET    Component Value Date/Time   NA 140 09/08/2017 1339   K 4.2 09/08/2017 1339   CL 103 09/08/2017 1339   CO2 22 09/08/2017 1339   GLUCOSE 83 09/08/2017 1339   GLUCOSE 82 08/14/2017 1601   BUN 17 09/08/2017 1339   CREATININE 0.99 09/08/2017 1339   CALCIUM 8.9 09/08/2017 1339   GFRNONAA 100 09/08/2017 1339   GFRAA 115 09/08/2017 1339   Lab Results  Component Value Date   HGBA1C 5.4 09/08/2017   HGBA1C 5.5 04/16/2017   Lab Results  Component Value Date   INSULIN 17.8 09/08/2017   CBC    Component Value Date/Time   WBC 6.6 09/08/2017 1339   WBC 7.6 04/16/2017 1404   RBC 5.12 09/08/2017 1339   RBC 5.02 04/16/2017 1404   HGB 15.6 09/08/2017 1339   HCT 44.8 09/08/2017 1339   PLT 230.0 04/16/2017 1404   MCV 88 09/08/2017 1339   MCH 30.5 09/08/2017 1339   MCH 30.2 12/21/2016 2026   MCHC 34.8 09/08/2017 1339   MCHC 34.5 04/16/2017 1404   RDW 14.0 09/08/2017 1339   LYMPHSABS 2.4 09/08/2017 1339   MONOABS 0.6 04/16/2017 1404   EOSABS 0.2 09/08/2017 1339   BASOSABS 0.1 09/08/2017 1339   Iron/TIBC/Ferritin/ %Sat No results found for: IRON, TIBC, FERRITIN, IRONPCTSAT Lipid Panel     Component Value Date/Time   CHOL 217 (H) 09/08/2017 1339   TRIG 165 (H) 09/08/2017 1339   HDL 35 (L) 09/08/2017 1339   CHOLHDL 6 04/16/2017 1404   VLDL 56.2 (H) 04/16/2017  1404   LDLCALC 149 (H) 09/08/2017 1339   LDLDIRECT 155.0 04/16/2017 1404   Hepatic Function Panel     Component Value Date/Time   PROT 6.8 09/08/2017 1339   ALBUMIN 4.4 09/08/2017 1339   AST 22 09/08/2017 1339   ALT 40 09/08/2017 1339   ALKPHOS 65 09/08/2017 1339   BILITOT 0.6 09/08/2017 1339      Component Value Date/Time   TSH 0.661 09/08/2017 1339   TSH 1.32 11/23/2015 1624   TSH 0.89 02/16/2009 0000     Ref. Range 09/08/2017 13:39  Vitamin D, 25-Hydroxy Latest Ref Range: 30.0 -  100.0 ng/mL 26.7 (L)   ASSESSMENT AND PLAN: Other hyperlipidemia  Vitamin D deficiency - Plan: Vitamin D, Ergocalciferol, (DRISDOL) 50000 units CAPS capsule  Insulin resistance - Plan: metFORMIN (GLUCOPHAGE) 500 MG tablet  At risk for diabetes mellitus  Class 2 severe obesity with serious comorbidity and body mass index (BMI) of 37.0 to 37.9 in adult, unspecified obesity type (HCC)  PLAN:  Vitamin D Deficiency Calixto was informed that low vitamin D levels contributes to fatigue and are associated with obesity, breast, and colon cancer. He agrees to start to take prescription Vit D @50 ,000 IU every week #4 with no refills and will follow up for routine testing of vitamin D, at least 2-3 times per year. He was informed of the risk of over-replacement of vitamin D and agrees to not increase his dose unless he discusses this with [name] first. Malacai agrees to follow up with our clinic in 2 to 3 weeks.  Insulin Resistance Huy will continue to work on weight loss, exercise, and decreasing simple carbohydrates in his diet to help decrease the risk of diabetes. We dicussed metformin including benefits and risks. He was informed that eating too many simple carbohydrates or too many calories at one sitting increases the likelihood of GI side effects. Emmerson agreed to start metformin for now and prescription was written today for metformin 500 mg qam #30 with no refills.Beulah Gandy agreed to follow up with  [name] as directed to monitor his progress.  Diabetes risk counseling Jaegar was given extended (30 minutes) diabetes prevention counseling today. He is 32 y.o. male and has risk factors for diabetes including obesity and insulin resistance. We discussed intensive lifestyle modifications today with an emphasis on weight loss as well as increasing exercise and decreasing simple carbohydrates in his diet.  Hyperlipidemia Saba was informed of the American Heart Association Guidelines emphasizing intensive lifestyle modifications as the first line treatment for hyperlipidemia. We discussed many lifestyle modifications today in depth, and Malakye will continue to work on decreasing saturated fats such as fatty red meat, butter and many fried foods. He will also increase vegetables and lean protein in his diet and continue to work on exercise and weight loss efforts.  Obesity Sergi is not currently in the action stage of change. He was encouraged to work on meal planning and following his category 3 plan, but I have doubts that he is ready to do this. He has agreed to portion control better and make smarter food choices, such as increase vegetables and decrease simple carbohydrates  and follow the Category 3 plan Kaedyn has been instructed to work up to a goal of 150 minutes of combined cardio and strengthening exercise per week for weight loss and overall health benefits. We discussed the following Behavioral Modification Strategies today: no skipping meals, decreasing simple carbohydrates  and decrease eating out  Burnett has agreed to follow up with our clinic in 2 to 3 weeks. He was informed of the importance of frequent follow up visits to maximize his success with intensive lifestyle modifications for his multiple health conditions.   OBESITY BEHAVIORAL INTERVENTION VISIT  Today's visit was # 2 out of 22.  Starting weight: 267 lbs Starting date: 09/08/17 Today's weight : 270 lbs Today's  date: 09/22/2017 Total lbs lost to date: 0 (Patients must lose 7 lbs in the first 6 months to continue with counseling)   ASK: We discussed the diagnosis of obesity with [name] today and Beulah Gandy agreed to give [name] permission  to discuss obesity behavioral modification therapy today.  ASSESS: Nikash has the diagnosis of obesity and his BMI today is 37.67 Johnathen is in the action stage of change   ADVISE: Diar was educated on the multiple health risks of obesity as well as the benefit of weight loss to improve his health. He was advised of the need for long term treatment and the importance of lifestyle modifications.  AGREE: Multiple dietary modification options and treatment options were discussed and  Crixus agreed to the above obesity treatment plan.  I, [place] Crane, am acting as transcriptionist for Quillian Quince, MD  I have reviewed the above documentation for accuracy and completeness, and I agree with the above. -Quillian Quince, MD

## 2017-09-23 ENCOUNTER — Ambulatory Visit: Payer: BLUE CROSS/BLUE SHIELD | Admitting: Internal Medicine

## 2017-09-23 DIAGNOSIS — Z0289 Encounter for other administrative examinations: Secondary | ICD-10-CM

## 2017-10-06 ENCOUNTER — Ambulatory Visit (INDEPENDENT_AMBULATORY_CARE_PROVIDER_SITE_OTHER): Payer: BLUE CROSS/BLUE SHIELD | Admitting: Family Medicine

## 2017-10-06 VITALS — BP 148/91 | HR 84 | Temp 97.5°F | Ht 71.0 in | Wt 271.0 lb

## 2017-10-06 DIAGNOSIS — I10 Essential (primary) hypertension: Secondary | ICD-10-CM

## 2017-10-06 DIAGNOSIS — Z9189 Other specified personal risk factors, not elsewhere classified: Secondary | ICD-10-CM | POA: Diagnosis not present

## 2017-10-06 DIAGNOSIS — Z6837 Body mass index (BMI) 37.0-37.9, adult: Secondary | ICD-10-CM | POA: Diagnosis not present

## 2017-10-06 MED ORDER — LORCASERIN HCL 10 MG PO TABS: 1.0000 | ORAL_TABLET | Freq: Two times a day (BID) | ORAL | 0 refills | Status: DC

## 2017-10-06 NOTE — Progress Notes (Signed)
Office: [phone]  /  Fax: [phone]   HPI:   Chief Complaint: OBESITY Cory Little is here to discuss his progress with his obesity treatment plan. He is on the portion control better and make smarter food choices, such as increase vegetables and decrease simple carbohydrates  and is following his eating plan approximately 0 % of the time. He states he is walking for 60 minutes 2 times per week. Cory Little did not follow a structured plan while on vacation but tried to portion control and make smarter food choices, and did well minimizing weight gain. He has not done well following any of our structured meal plans and instead would like to have lap band surgery, he requests some "diet pills". His weight is 271 lb (122.9 kg) today and has gained 1 pound since his last visit. He has lost 0 lbs since starting treatment with [name].  Hypertension Cory Little is a 32 y.o. male with hypertension. Cory Little is on Micardis HCT but didn't take it this morning and didn't take it while on Vacation. He is not following a structured diet prescription. He denies chest pain. He is working weight loss to help control his blood pressure with the goal of decreasing his risk of heart attack and stroke. Cory Little's blood pressure is not currently controlled.  At risk for cardiovascular disease Cory Little is at a higher than average risk for cardiovascular disease due to obesity and hypertension. He currently denies any chest pain.  ALLERGIES: No Known Allergies  MEDICATIONS: Current Outpatient Medications on File Prior to Visit  Medication Sig Dispense Refill  . ASPIRIN PO Take by mouth.    [name] buPROPion (WELLBUTRIN XL) 150 MG 24 hr tablet Take 1 tablet (150 mg total) by mouth daily. 90 tablet 1  . COD LIVER OIL PO Take by mouth.    . Coenzyme Q10 (COQ10 PO) Take by mouth.    . [facility] MELATONIN PO Take by mouth.    . Ginkgo Biloba (GINKOBA PO) Take by mouth.    . LYSINE PO Take by mouth.    [name] MAGNESIUM PO Take by  mouth.    . metFORMIN (GLUCOPHAGE) 500 MG tablet Take 1 tablet (500 mg total) by mouth daily with breakfast. 30 tablet 0  . Multiple Vitamins-Minerals (CENTRUM ADULTS PO) Take by mouth.    . telmisartan-hydrochlorothiazide (MICARDIS HCT) 80-12.5 MG tablet Take 1 tablet by mouth daily. 90 tablet 1  . Vitamin D, Ergocalciferol, (DRISDOL) 50000 units CAPS capsule Take 1 capsule (50,000 Units total) by mouth every 7 (seven) days. 4 capsule 0   No current facility-administered medications on file prior to visit.     PAST MEDICAL HISTORY: Past Medical History:  Diagnosis Date  . ADD (attention deficit disorder)   . Chest pain   . Chest tightness   . Depression   . GERD (gastroesophageal reflux disease)   . Hypertension   . Intolerance to cold   . Kidney stone 2004  . Sleep apnea     PAST SURGICAL HISTORY: Past Surgical History:  Procedure Laterality Date  . facial liposuction    . WISDOM TOOTH EXTRACTION  2016    SOCIAL HISTORY: Social History   Tobacco Use  . Smoking status: Former Smoker    Types: Cigarettes  . Smokeless tobacco: Never Used  Substance Use Topics  . Alcohol use: No    Comment: socially  . Drug use: Yes    Types: Marijuana    FAMILY HISTORY: Family History  Problem Relation  Age of Onset  . Sleep apnea Father   . Depression Mother     ROS: Review of Systems  Constitutional: Negative for weight loss.  Cardiovascular: Negative for chest pain.    PHYSICAL EXAM: Blood pressure (!) 148/91, pulse 84, temperature (!) 97.5 F (36.4 C), temperature source Oral, height 5\' 11"  (1.803 m), weight 271 lb (122.9 kg), SpO2 97 %. Body mass index is 37.8 kg/m. Physical Exam  Constitutional: He is oriented to person, place, and time. He appears well-developed and well-nourished.  Cardiovascular: Normal rate.  Pulmonary/Chest: Effort normal.  Musculoskeletal: Normal range of motion.  Neurological: He is oriented to person, place, and time.  Skin: Skin is warm  and dry.  Psychiatric: He has a normal mood and affect. His behavior is normal.  Vitals reviewed.   RECENT LABS AND TESTS: BMET    Component Value Date/Time   NA 140 09/08/2017 1339   K 4.2 09/08/2017 1339   CL 103 09/08/2017 1339   CO2 22 09/08/2017 1339   GLUCOSE 83 09/08/2017 1339   GLUCOSE 82 08/14/2017 1601   BUN 17 09/08/2017 1339   CREATININE 0.99 09/08/2017 1339   CALCIUM 8.9 09/08/2017 1339   GFRNONAA 100 09/08/2017 1339   GFRAA 115 09/08/2017 1339   Lab Results  Component Value Date   HGBA1C 5.4 09/08/2017   HGBA1C 5.5 04/16/2017   Lab Results  Component Value Date   INSULIN 17.8 09/08/2017   CBC    Component Value Date/Time   WBC 6.6 09/08/2017 1339   WBC 7.6 04/16/2017 1404   RBC 5.12 09/08/2017 1339   RBC 5.02 04/16/2017 1404   HGB 15.6 09/08/2017 1339   HCT 44.8 09/08/2017 1339   PLT 230.0 04/16/2017 1404   MCV 88 09/08/2017 1339   MCH 30.5 09/08/2017 1339   MCH 30.2 12/21/2016 2026   MCHC 34.8 09/08/2017 1339   MCHC 34.5 04/16/2017 1404   RDW 14.0 09/08/2017 1339   LYMPHSABS 2.4 09/08/2017 1339   MONOABS 0.6 04/16/2017 1404   EOSABS 0.2 09/08/2017 1339   BASOSABS 0.1 09/08/2017 1339   Iron/TIBC/Ferritin/ %Sat No results found for: IRON, TIBC, FERRITIN, IRONPCTSAT Lipid Panel     Component Value Date/Time   CHOL 217 (H) 09/08/2017 1339   TRIG 165 (H) 09/08/2017 1339   HDL 35 (L) 09/08/2017 1339   CHOLHDL 6 04/16/2017 1404   VLDL 56.2 (H) 04/16/2017 1404   LDLCALC 149 (H) 09/08/2017 1339   LDLDIRECT 155.0 04/16/2017 1404   Hepatic Function Panel     Component Value Date/Time   PROT 6.8 09/08/2017 1339   ALBUMIN 4.4 09/08/2017 1339   AST 22 09/08/2017 1339   ALT 40 09/08/2017 1339   ALKPHOS 65 09/08/2017 1339   BILITOT 0.6 09/08/2017 1339      Component Value Date/Time   TSH 0.661 09/08/2017 1339   TSH 1.32 11/23/2015 1624   TSH 0.89 02/16/2009 0000    ASSESSMENT AND PLAN: Essential hypertension  At risk for heart  disease  Class 2 severe obesity with serious comorbidity and body mass index (BMI) of 37.0 to 37.9 in adult, unspecified obesity type (HCC) - Plan: Cory HCl (BELVIQ) 10 MG TABS  PLAN:  Hypertension We discussed sodium restriction, working on healthy weight loss, and a regular exercise program as the means to achieve improved blood pressure control. Cory Little agreed with this plan and agreed to follow up as directed. We will continue to monitor his blood pressure as well as his progress with the  above lifestyle modifications. Cory Little was advised on the importance of weight loss to avoid myocardial infarction or cerebrovascular accident and was advised we would be limited on weight loss medications until his blood pressure was under better control. Cory Little agreed to take his Micardis faithfully and will watch for signs of hypotension as he continues his lifestyle modifications. Cory Little agrees to follow up with our clinic in 2 weeks.  Cardiovascular risk counselling Cory Little was given extended (15 minutes) coronary artery disease prevention counseling today. He is 32 y.o. male and has risk factors for heart disease including obesity and hypertension. We discussed intensive lifestyle modifications today with an emphasis on specific weight loss instructions and strategies. Pt was also informed of the importance of increasing exercise and decreasing saturated fats to help prevent heart disease.  Obesity Cory Little is currently in the action stage of change. As such, his goal is to continue with weight loss efforts He has agreed to change to follow a lower carbohydrate, vegetable and lean protein rich diet plan Cory Little has been instructed to work up to a goal of 150 minutes of combined cardio and strengthening exercise per week for weight loss and overall health benefits. We discussed the following Behavioral Modification Strategies today: increasing lean protein intake, decreasing simple carbohydrates   and decrease eating out We discussed various medication options to help Shigeo with his weight loss efforts and we both agreed to start Belviq 10 mg BID #60 with no refills. Dontrez's blood pressure is not controlled do medications like phentermine, Contrave, and Qsymia are contraindicated.   Markese has agreed to follow up with our clinic in 2 weeks. He was informed of the importance of frequent follow up visits to maximize his success with intensive lifestyle modifications for his multiple health conditions.   OBESITY BEHAVIORAL INTERVENTION VISIT  Today's visit was # 3 out of 22.  Starting weight: 267 lbs Starting date: 09/08/17 Today's weight : 271 lbs Today's date: 10/06/2017 Total lbs lost to date: 0 (Patients must lose 7 lbs in the first 6 months to continue with counseling)   ASK: We discussed the diagnosis of obesity with Cory Little today and Cory Little agreed to give [name] permission to discuss obesity behavioral modification therapy today.  ASSESS: Cory Little has the diagnosis of obesity and his BMI today is 37.81 Jeiden is in the action stage of change   ADVISE: Cory Little was educated on the multiple health risks of obesity as well as the benefit of weight loss to improve his health. He was advised of the need for long term treatment and the importance of lifestyle modifications.  AGREE: Multiple dietary modification options and treatment options were discussed and  Cory Little agreed to the above obesity treatment plan.  I, Cory Little, am acting as transcriptionist for Cory Little Quince, MD  I have reviewed the above documentation for accuracy and completeness, and I agree with the above. -Cory Little Quince, MD

## 2017-10-14 ENCOUNTER — Encounter (INDEPENDENT_AMBULATORY_CARE_PROVIDER_SITE_OTHER): Payer: Self-pay

## 2017-10-27 ENCOUNTER — Ambulatory Visit (INDEPENDENT_AMBULATORY_CARE_PROVIDER_SITE_OTHER): Payer: BLUE CROSS/BLUE SHIELD | Admitting: Family Medicine

## 2017-10-27 ENCOUNTER — Encounter (INDEPENDENT_AMBULATORY_CARE_PROVIDER_SITE_OTHER): Payer: Self-pay

## 2017-10-27 VITALS — BP 145/87 | HR 70 | Temp 98.0°F | Ht 71.0 in | Wt 270.0 lb

## 2017-10-27 DIAGNOSIS — R7303 Prediabetes: Secondary | ICD-10-CM

## 2017-10-27 DIAGNOSIS — Z6837 Body mass index (BMI) 37.0-37.9, adult: Secondary | ICD-10-CM

## 2017-10-27 DIAGNOSIS — G4733 Obstructive sleep apnea (adult) (pediatric): Secondary | ICD-10-CM | POA: Diagnosis not present

## 2017-10-27 DIAGNOSIS — I10 Essential (primary) hypertension: Secondary | ICD-10-CM

## 2017-10-27 DIAGNOSIS — Z9189 Other specified personal risk factors, not elsewhere classified: Secondary | ICD-10-CM

## 2017-10-27 MED ORDER — TELMISARTAN-HCTZ 80-12.5 MG PO TABS: ORAL_TABLET | ORAL | 0 refills | Status: DC

## 2017-10-27 MED ORDER — METFORMIN HCL 500 MG PO TABS: 500.0000 mg | ORAL_TABLET | Freq: Every day | ORAL | 0 refills | Status: DC

## 2017-10-27 NOTE — Progress Notes (Signed)
Office: [phone]  /  Fax: [phone]   HPI:   Chief Complaint: OBESITY Cory Little is here to discuss his progress with his obesity treatment plan. He is on the lower carbohydrate, vegetable and lean protein rich diet plan and is following his eating plan approximately 50 % of the time. He states he is walking 20 to 30  minutes 4 times per week. Cory Little started Belviq approximately 1 week ago and feels it is helping with hunger. He is still struggling to follow the low carbohydrate plan and is eating out socially. His weight is 270 lb (122.5 kg) today and has had a weight loss of 1 pounds over a period of 3 weeks since his last visit. He has gained 3 lbs since starting treatment with [name].  Pre-Diabetes Cory Little has a diagnosis of pre-diabetes based on his elevated Hgb A1c and was informed this puts him at greater risk of developing diabetes. He is stable on metformin and he is trying to follow the diet prescription. Cory Little continues to work on diet and exercise to decrease risk of diabetes. He denies nausea, vomiting or hypoglycemia.  Hypertension [name] is a 32 y.o. male with hypertension. His blood pressure is still elevated on Micardis. He has a history of sleep apnea. He is not losing weight but he is trying to decrease his sodium intake. Cory Little denies chest pain or shortness of breath on exertion. He is working weight loss to help control his blood pressure with the goal of decreasing his risk of heart attack and stroke. Cory Little blood pressure is not currently controlled.  Sleep Apnea Cory Little has a diagnosis of sleep apnea. and Cory Little  doesn't tolerate the CPAP mask and is not using CPAP.He has uncontrolled hypertension. Cory Little is struggling to lose weight.  At risk for cardiovascular disease Cory Little is at a higher than average risk for cardiovascular disease due to obesity, pre-diabetes, hypertension and sleep apnea. He currently denies any chest  pain.  ALLERGIES: No Known Allergies  MEDICATIONS: Current Outpatient Medications on File Prior to Visit  Medication Sig Dispense Refill  . ASPIRIN PO Take by mouth.    [name] buPROPion (WELLBUTRIN XL) 150 MG 24 hr tablet Take 1 tablet (150 mg total) by mouth daily. 90 tablet 1  . COD LIVER OIL PO Take by mouth.    . Coenzyme Q10 (COQ10 PO) Take by mouth.    . [facility] MELATONIN PO Take by mouth.    . Ginkgo Biloba (GINKOBA PO) Take by mouth.    . Lorcaserin HCl (BELVIQ) 10 MG TABS Take 1 tablet by mouth 2 (two) times daily. 60 tablet 0  . LYSINE PO Take by mouth.    [name] MAGNESIUM PO Take by mouth.    . metFORMIN (GLUCOPHAGE) 500 MG tablet Take 1 tablet (500 mg total) by mouth daily with breakfast. 30 tablet 0  . Multiple Vitamins-Minerals (CENTRUM ADULTS PO) Take by mouth.    . Vitamin D, Ergocalciferol, (DRISDOL) 50000 units CAPS capsule Take 1 capsule (50,000 Units total) by mouth every 7 (seven) days. 4 capsule 0   No current facility-administered medications on file prior to visit.     PAST MEDICAL HISTORY: Past Medical History:  Diagnosis Date  . ADD (attention deficit disorder)   . Chest pain   . Chest tightness   . Depression   . GERD (gastroesophageal reflux disease)   . Hypertension   . Intolerance to cold   . Kidney stone 2004  . Sleep apnea  PAST SURGICAL HISTORY: Past Surgical History:  Procedure Laterality Date  . facial liposuction    . WISDOM TOOTH EXTRACTION  2016    SOCIAL HISTORY: Social History   Tobacco Use  . Smoking status: Former Smoker    Types: Cigarettes  . Smokeless tobacco: Never Used  Substance Use Topics  . Alcohol use: No    Comment: socially  . Drug use: Yes    Types: Marijuana    FAMILY HISTORY: Family History  Problem Relation Age of Onset  . Sleep apnea Father   . Depression Mother     ROS: Review of Systems  Constitutional: Positive for weight loss.  Respiratory: Negative for shortness of breath (on exertion).    Cardiovascular: Negative for chest pain.  Gastrointestinal: Negative for nausea and vomiting.  Endo/Heme/Allergies:       Negative for hypoglycemia    PHYSICAL EXAM: Blood pressure (!) 145/87, pulse 70, temperature 98 F (36.7 C), temperature source Oral, height 5\' 11"  (1.803 m), weight 270 lb (122.5 kg), SpO2 97 %. Body mass index is 37.66 kg/m. Physical Exam  Constitutional: He is oriented to person, place, and time. He appears well-developed and well-nourished.  Cardiovascular: Normal rate.  Pulmonary/Chest: Effort normal.  Musculoskeletal: Normal range of motion.  Neurological: He is oriented to person, place, and time.  Skin: Skin is warm and dry.  Psychiatric: He has a normal mood and affect. His behavior is normal.  Vitals reviewed.   RECENT LABS AND TESTS: BMET    Component Value Date/Time   NA 140 09/08/2017 1339   K 4.2 09/08/2017 1339   CL 103 09/08/2017 1339   CO2 22 09/08/2017 1339   GLUCOSE 83 09/08/2017 1339   GLUCOSE 82 08/14/2017 1601   BUN 17 09/08/2017 1339   CREATININE 0.99 09/08/2017 1339   CALCIUM 8.9 09/08/2017 1339   GFRNONAA 100 09/08/2017 1339   GFRAA 115 09/08/2017 1339   Lab Results  Component Value Date   HGBA1C 5.4 09/08/2017   HGBA1C 5.5 04/16/2017   Lab Results  Component Value Date   INSULIN 17.8 09/08/2017   CBC    Component Value Date/Time   WBC 6.6 09/08/2017 1339   WBC 7.6 04/16/2017 1404   RBC 5.12 09/08/2017 1339   RBC 5.02 04/16/2017 1404   HGB 15.6 09/08/2017 1339   HCT 44.8 09/08/2017 1339   PLT 230.0 04/16/2017 1404   MCV 88 09/08/2017 1339   MCH 30.5 09/08/2017 1339   MCH 30.2 12/21/2016 2026   MCHC 34.8 09/08/2017 1339   MCHC 34.5 04/16/2017 1404   RDW 14.0 09/08/2017 1339   LYMPHSABS 2.4 09/08/2017 1339   MONOABS 0.6 04/16/2017 1404   EOSABS 0.2 09/08/2017 1339   BASOSABS 0.1 09/08/2017 1339   Iron/TIBC/Ferritin/ %Sat No results found for: IRON, TIBC, FERRITIN, IRONPCTSAT Lipid Panel     Component  Value Date/Time   CHOL 217 (H) 09/08/2017 1339   TRIG 165 (H) 09/08/2017 1339   HDL 35 (L) 09/08/2017 1339   CHOLHDL 6 04/16/2017 1404   VLDL 56.2 (H) 04/16/2017 1404   LDLCALC 149 (H) 09/08/2017 1339   LDLDIRECT 155.0 04/16/2017 1404   Hepatic Function Panel     Component Value Date/Time   PROT 6.8 09/08/2017 1339   ALBUMIN 4.4 09/08/2017 1339   AST 22 09/08/2017 1339   ALT 40 09/08/2017 1339   ALKPHOS 65 09/08/2017 1339   BILITOT 0.6 09/08/2017 1339      Component Value Date/Time   TSH 0.661 09/08/2017  1339   TSH 1.32 11/23/2015 1624   TSH 0.89 02/16/2009 0000   Results for AADON, GORELIK (MRN [id number]) as of 10/27/2017 11:45  Ref. Range 09/08/2017 13:39  Vitamin D, 25-Hydroxy Latest Ref Range: 30.0 - 100.0 ng/mL 26.7 (L)   ASSESSMENT AND PLAN: Essential hypertension - Plan: telmisartan-hydrochlorothiazide (MICARDIS HCT) 80-12.5 MG tablet  Prediabetes - Plan: metFORMIN (GLUCOPHAGE) 500 MG tablet  Obstructive sleep apnea syndrome - Plan: Ambulatory referral to Sleep Studies  At risk for heart disease  Class 2 severe obesity with serious comorbidity and body mass index (BMI) of 37.0 to 37.9 in adult, unspecified obesity type (HCC)  PLAN:  Pre-Diabetes Daven will continue to work on weight loss, exercise, and decreasing simple carbohydrates in his diet to help decrease the risk of diabetes. We dicussed metformin including benefits and risks. He was informed that eating too many simple carbohydrates or too many calories at one sitting increases the likelihood of GI side effects. Beulah Gandy requested metformin for now and a prescription was written today for 1 month refill. Aamir agreed to follow up with [name] as directed to monitor his progress.  Hypertension We discussed sodium restriction, working on healthy weight loss, and a regular exercise program as the means to achieve improved blood pressure control. Vin agreed with this plan and agreed to follow up as  directed. We will continue to monitor his blood pressure as well as his progress with the above lifestyle modifications. He agreed to increase Micardis Hct  To 160/25 mg bid #60 with no refills and will watch for signs of hypotension as he continues his lifestyle modifications.  Sleep Apnea We will refer to Dr. Vickey Huger for evaluation and Tylerjames will continue with weight loss efforts and follow up with our clinic in 2 to 3 weeks.  Cardiovascular risk counseling Heitor was given extended (15 minutes) coronary artery disease prevention counseling today. He is 32 y.o. male and has risk factors for heart disease including obesity, pre-diabetes, hypertension and sleep apnea. We discussed intensive lifestyle modifications today with an emphasis on specific weight loss instructions and strategies. Pt was also informed of the importance of increasing exercise and decreasing saturated fats to help prevent heart disease.  Obesity Rahm is currently in the action stage of change. As such, his goal is to continue with weight loss efforts He has agreed to follow a lower carbohydrate, vegetable and lean protein rich diet plan Chamberlain has been instructed to work up to a goal of 150 minutes of combined cardio and strengthening exercise per week for weight loss and overall health benefits. We discussed the following Behavioral Modification Strategies today: decrease eating out and no skipping meals  Albi has agreed to follow up with our clinic in 2 to 3 weeks. He was informed of the importance of frequent follow up visits to maximize his success with intensive lifestyle modifications for his multiple health conditions.   OBESITY BEHAVIORAL INTERVENTION VISIT  Today's visit was # 4 out of 22.  Starting weight: 267 lbs Starting date: 09/08/17 Today's weight : 270 lbs Today's date: 10/27/2017 Total lbs lost to date: 0 (Patients must lose 7 lbs in the first 6 months to continue with  counseling)   ASK: We discussed the diagnosis of obesity with Cory Little today and Obdulio agreed to give [name] permission to discuss obesity behavioral modification therapy today.  ASSESS: Quenten has the diagnosis of obesity and his BMI today is 37.67 Eragon is in the action stage of change  ADVISE: Beulah GandyRudolph was educated on the multiple health risks of obesity as well as the benefit of weight loss to improve his health. He was advised of the need for long term treatment and the importance of lifestyle modifications.  AGREE: Multiple dietary modification options and treatment options were discussed and  Beulah GandyRudolph agreed to the above obesity treatment plan.  I, [place] CraneJoanne Murray, am acting as transcriptionist for Quillian Quincearen Kathyrn Warmuth, MD  I have reviewed the above documentation for accuracy and completeness, and I agree with the above. -Quillian Quincearen Bindi Klomp, MD

## 2017-10-28 ENCOUNTER — Encounter (INDEPENDENT_AMBULATORY_CARE_PROVIDER_SITE_OTHER): Payer: Self-pay

## 2017-10-28 ENCOUNTER — Other Ambulatory Visit (INDEPENDENT_AMBULATORY_CARE_PROVIDER_SITE_OTHER): Payer: Self-pay

## 2017-10-28 DIAGNOSIS — I10 Essential (primary) hypertension: Secondary | ICD-10-CM

## 2017-10-28 MED ORDER — TELMISARTAN-HCTZ 80-25 MG PO TABS: 1.0000 | ORAL_TABLET | Freq: Every day | ORAL | 1 refills | Status: DC

## 2017-11-12 DIAGNOSIS — G4733 Obstructive sleep apnea (adult) (pediatric): Secondary | ICD-10-CM | POA: Diagnosis not present

## 2017-11-12 DIAGNOSIS — I10 Essential (primary) hypertension: Secondary | ICD-10-CM | POA: Diagnosis not present

## 2017-11-17 ENCOUNTER — Ambulatory Visit (INDEPENDENT_AMBULATORY_CARE_PROVIDER_SITE_OTHER): Payer: BLUE CROSS/BLUE SHIELD | Admitting: Family Medicine

## 2017-11-17 VITALS — BP 137/84 | HR 64 | Temp 98.1°F | Ht 71.0 in | Wt 270.0 lb

## 2017-11-17 DIAGNOSIS — Z6837 Body mass index (BMI) 37.0-37.9, adult: Secondary | ICD-10-CM

## 2017-11-17 DIAGNOSIS — R7303 Prediabetes: Secondary | ICD-10-CM | POA: Diagnosis not present

## 2017-11-17 NOTE — Progress Notes (Signed)
Office: [phone]  /  Fax: [phone]   HPI:   Chief Complaint: OBESITY Shamarcus is here to discuss his progress with his obesity treatment plan. He is on the lower carbohydrate, vegetable and lean protein rich diet plan and is following his eating plan approximately 50 % of the time. He states he is walking for 60 minutes 2-3 times per week. Lorry has done well maintaining weight but has not been following his plan closely. He is considering having weight loss surgery and wants the band because he is not committed to permanent anatomical change. He does not appear to be in the action stage of change. He is going to [place] soon to visit the marijuana cafe's and would like some strategies to decrease "the munchies".  His weight is 270 lb (122.5 kg) today and has not lost weight since his last visit. He has lost 0 lbs since starting treatment with [name].  Pre-Diabetes Khylen has a diagnosis of pre-diabetes based on his elevated Hgb A1c and was informed this puts him at greater risk of developing diabetes. He is stable on metformin, not following his diet prescription and struggling to plan meals ahead and decrease simple carbohydrates. He denies nausea or hypoglycemia.  ALLERGIES: No Known Allergies  MEDICATIONS: Current Outpatient Medications on File Prior to Visit  Medication Sig Dispense Refill  . ASPIRIN PO Take by mouth.    [name] buPROPion (WELLBUTRIN XL) 150 MG 24 hr tablet Take 1 tablet (150 mg total) by mouth daily. 90 tablet 1  . COD LIVER OIL PO Take by mouth.    . Coenzyme Q10 (COQ10 PO) Take by mouth.    . [facility] MELATONIN PO Take by mouth.    . Ginkgo Biloba (GINKOBA PO) Take by mouth.    . Lorcaserin HCl (BELVIQ) 10 MG TABS Take 1 tablet by mouth 2 (two) times daily. 60 tablet 0  . LYSINE PO Take by mouth.    [name] MAGNESIUM PO Take by mouth.    . metFORMIN (GLUCOPHAGE) 500 MG tablet Take 1 tablet (500 mg total) by mouth daily with breakfast. 30 tablet 0  . Multiple  Vitamins-Minerals (CENTRUM ADULTS PO) Take by mouth.    . telmisartan-hydrochlorothiazide (MICARDIS HCT) 80-25 MG tablet Take 1 tablet by mouth daily. 30 tablet 1  . Vitamin D, Ergocalciferol, (DRISDOL) 50000 units CAPS capsule Take 1 capsule (50,000 Units total) by mouth every 7 (seven) days. 4 capsule 0   No current facility-administered medications on file prior to visit.     PAST MEDICAL HISTORY: Past Medical History:  Diagnosis Date  . ADD (attention deficit disorder)   . Chest pain   . Chest tightness   . Depression   . GERD (gastroesophageal reflux disease)   . Hypertension   . Intolerance to cold   . Kidney stone 2004  . Sleep apnea     PAST SURGICAL HISTORY: Past Surgical History:  Procedure Laterality Date  . facial liposuction    . WISDOM TOOTH EXTRACTION  2016    SOCIAL HISTORY: Social History   Tobacco Use  . Smoking status: Former Smoker    Types: Cigarettes  . Smokeless tobacco: Never Used  Substance Use Topics  . Alcohol use: No    Comment: socially  . Drug use: Yes    Types: Marijuana    FAMILY HISTORY: Family History  Problem Relation Age of Onset  . Sleep apnea Father   . Depression Mother     ROS: Review of Systems  Constitutional: Negative for weight loss.  Gastrointestinal: Negative for nausea.  Endo/Heme/Allergies:       Negative hypoglycemia    PHYSICAL EXAM: Blood pressure 137/84, pulse 64, temperature 98.1 F (36.7 C), height 5\' 11"  (1.803 m), weight 270 lb (122.5 kg), SpO2 97 %. Body mass index is 37.66 kg/m. Physical Exam  Constitutional: He is oriented to person, place, and time. He appears well-developed and well-nourished.  Cardiovascular: Normal rate.  Pulmonary/Chest: Effort normal.  Musculoskeletal: Normal range of motion.  Neurological: He is oriented to person, place, and time.  Skin: Skin is warm and dry.  Psychiatric: He has a normal mood and affect. His behavior is normal.  Vitals reviewed.   RECENT LABS  AND TESTS: BMET    Component Value Date/Time   NA 140 09/08/2017 1339   K 4.2 09/08/2017 1339   CL 103 09/08/2017 1339   CO2 22 09/08/2017 1339   GLUCOSE 83 09/08/2017 1339   GLUCOSE 82 08/14/2017 1601   BUN 17 09/08/2017 1339   CREATININE 0.99 09/08/2017 1339   CALCIUM 8.9 09/08/2017 1339   GFRNONAA 100 09/08/2017 1339   GFRAA 115 09/08/2017 1339   Lab Results  Component Value Date   HGBA1C 5.4 09/08/2017   HGBA1C 5.5 04/16/2017   Lab Results  Component Value Date   INSULIN 17.8 09/08/2017   CBC    Component Value Date/Time   WBC 6.6 09/08/2017 1339   WBC 7.6 04/16/2017 1404   RBC 5.12 09/08/2017 1339   RBC 5.02 04/16/2017 1404   HGB 15.6 09/08/2017 1339   HCT 44.8 09/08/2017 1339   PLT 230.0 04/16/2017 1404   MCV 88 09/08/2017 1339   MCH 30.5 09/08/2017 1339   MCH 30.2 12/21/2016 2026   MCHC 34.8 09/08/2017 1339   MCHC 34.5 04/16/2017 1404   RDW 14.0 09/08/2017 1339   LYMPHSABS 2.4 09/08/2017 1339   MONOABS 0.6 04/16/2017 1404   EOSABS 0.2 09/08/2017 1339   BASOSABS 0.1 09/08/2017 1339   Iron/TIBC/Ferritin/ %Sat No results found for: IRON, TIBC, FERRITIN, IRONPCTSAT Lipid Panel     Component Value Date/Time   CHOL 217 (H) 09/08/2017 1339   TRIG 165 (H) 09/08/2017 1339   HDL 35 (L) 09/08/2017 1339   CHOLHDL 6 04/16/2017 1404   VLDL 56.2 (H) 04/16/2017 1404   LDLCALC 149 (H) 09/08/2017 1339   LDLDIRECT 155.0 04/16/2017 1404   Hepatic Function Panel     Component Value Date/Time   PROT 6.8 09/08/2017 1339   ALBUMIN 4.4 09/08/2017 1339   AST 22 09/08/2017 1339   ALT 40 09/08/2017 1339   ALKPHOS 65 09/08/2017 1339   BILITOT 0.6 09/08/2017 1339      Component Value Date/Time   TSH 0.661 09/08/2017 1339   TSH 1.32 11/23/2015 1624   TSH 0.89 02/16/2009 0000    ASSESSMENT AND PLAN: Prediabetes  Class 2 severe obesity with serious comorbidity and body mass index (BMI) of 37.0 to 37.9 in adult, unspecified obesity type  (HCC)  PLAN:  Pre-Diabetes Edmundo will continue to work on weight loss, exercise, and decreasing simple carbohydrates in his diet to help decrease the risk of diabetes. We dicussed metformin including benefits and risks. He was informed that eating too many simple carbohydrates or too many calories at one sitting increases the likelihood of GI side effects. Bobbi agrees to continue taking metformin and work on a structured diet. [place] agrees to follow up with our clinic in 3 to 4 weeks as directed to monitor his  progress.  We spent > than 50% of the 15 minute visit on the counseling as documented in the note.  Obesity Beulah GandyRudolph is not currently in the action stage of change. Gave advice to help mitigate weight gain while traveling. As such, his goal is to continue with weight loss efforts He has agreed to keep a food journal with 1400-1800 calories and 100 grams of protein daily Beulah GandyRudolph has been instructed to work up to a goal of 150 minutes of combined cardio and strengthening exercise per week for weight loss and overall health benefits. We discussed the following Behavioral Modification Strategies today: increasing lean protein intake and decreasing simple carbohydrates    Beulah GandyRudolph has agreed to follow up with our clinic in 3 to 4 weeks. He was informed of the importance of frequent follow up visits to maximize his success with intensive lifestyle modifications for his multiple health conditions.   OBESITY BEHAVIORAL INTERVENTION VISIT  Today's visit was # 5 out of 22.  Starting weight: 267 lbs Starting date: 09/08/17 Today's weight : 270 lbs  Today's date: 11/17/2017 Total lbs lost to date: 0 (Patients must lose 7 lbs in the first 6 months to continue with counseling)   ASK: We discussed the diagnosis of obesity with Virgel Palingudolph L Christensen today and Beulah GandyRudolph agreed to give us permission to discuss obesity behavioral modification therapy today.  ASSESS: Beulah GandyRudolph has the diagnosis of  obesity and his BMI today is 37.67 Beulah GandyRudolph is not in the action stage of change   ADVISE: Beulah GandyRudolph was educated on the multiple health risks of obesity as well as the benefit of weight loss to improve his health. He was advised of the need for long term treatment and the importance of lifestyle modifications.  AGREE: Multiple dietary modification options and treatment options were discussed and  Beulah GandyRudolph agreed to the above obesity treatment plan.  I, Burt KnackSharon Martin, am acting as transcriptionist for Quillian Quincearen Joslyn Ramos, MD  I have reviewed the above documentation for accuracy and completeness, and I agree with the above. -Quillian Quincearen Dollie Bressi, MD

## 2017-11-19 ENCOUNTER — Other Ambulatory Visit (HOSPITAL_COMMUNITY): Payer: Self-pay | Admitting: General Surgery

## 2017-12-05 DIAGNOSIS — K0889 Other specified disorders of teeth and supporting structures: Secondary | ICD-10-CM | POA: Diagnosis not present

## 2017-12-08 ENCOUNTER — Ambulatory Visit (INDEPENDENT_AMBULATORY_CARE_PROVIDER_SITE_OTHER): Payer: BLUE CROSS/BLUE SHIELD | Admitting: Family Medicine

## 2017-12-08 ENCOUNTER — Encounter: Payer: BLUE CROSS/BLUE SHIELD | Attending: General Surgery | Admitting: Skilled Nursing Facility1

## 2017-12-08 ENCOUNTER — Encounter: Payer: Self-pay | Admitting: Skilled Nursing Facility1

## 2017-12-08 VITALS — BP 125/78 | HR 71 | Temp 98.2°F | Ht 71.0 in | Wt 269.0 lb

## 2017-12-08 DIAGNOSIS — R7303 Prediabetes: Secondary | ICD-10-CM | POA: Diagnosis not present

## 2017-12-08 DIAGNOSIS — Z79899 Other long term (current) drug therapy: Secondary | ICD-10-CM | POA: Insufficient documentation

## 2017-12-08 DIAGNOSIS — K219 Gastro-esophageal reflux disease without esophagitis: Secondary | ICD-10-CM | POA: Diagnosis not present

## 2017-12-08 DIAGNOSIS — Z6837 Body mass index (BMI) 37.0-37.9, adult: Secondary | ICD-10-CM | POA: Diagnosis not present

## 2017-12-08 DIAGNOSIS — E559 Vitamin D deficiency, unspecified: Secondary | ICD-10-CM | POA: Diagnosis not present

## 2017-12-08 DIAGNOSIS — Z87891 Personal history of nicotine dependence: Secondary | ICD-10-CM | POA: Insufficient documentation

## 2017-12-08 DIAGNOSIS — Z818 Family history of other mental and behavioral disorders: Secondary | ICD-10-CM | POA: Insufficient documentation

## 2017-12-08 DIAGNOSIS — Z713 Dietary counseling and surveillance: Secondary | ICD-10-CM | POA: Diagnosis present

## 2017-12-08 DIAGNOSIS — Z7982 Long term (current) use of aspirin: Secondary | ICD-10-CM | POA: Insufficient documentation

## 2017-12-08 DIAGNOSIS — Z6839 Body mass index (BMI) 39.0-39.9, adult: Secondary | ICD-10-CM | POA: Insufficient documentation

## 2017-12-08 DIAGNOSIS — Z8489 Family history of other specified conditions: Secondary | ICD-10-CM | POA: Insufficient documentation

## 2017-12-08 MED ORDER — VITAMIN D (ERGOCALCIFEROL) 1.25 MG (50000 UNIT) PO CAPS: 50000.0000 [IU] | ORAL_CAPSULE | ORAL | 0 refills | Status: DC

## 2017-12-08 NOTE — Progress Notes (Signed)
Pre-Op Assessment Visit:  Pre-Operative Lap Band Surgery  Medical Nutrition Therapy:  Appt start time: 1:56  End time:  2:37  Patient was seen on 12/08/2017 for Pre-Operative Nutrition Assessment. Assessment and letter of approval faxed to [facility] Bariatric Surgery Program coordinator on 12/08/2017.   Pt states he has stopped taking wellbutrin. Pt states he skips meale throughout the day to save cash. Pt was curious about marijuana and how it would effect surgery: Dietitian educated the pt on the lack of evidence. Pt states he has decided to do the lap band because he is not that obese.   Pt expectation of surgery: to drop 40, 50, maybe 70 pounds and have improvements of sleep apnea   Pt expectation of Dietitian: no   Start weight at [facility]: 273.4 BMI: 39.23  24 hr Dietary Recall: First Meal: egg burrito or fruit cup Snack: cookies or beef jerky Second Meal:  Snack:  Third Meal: frozen meal or fast food Snack:  Beverages: water, coke zero  Encouraged to engage in 150 minutes of moderate physical activity including cardiovascular and weight baring weekly  Handouts given during visit include:  . Pre-Op Goals . Bariatric Surgery Protein Shakes During the appointment today the following Pre-Op Goals were reviewed with the patient: . Maintain or lose weight as instructed by your surgeon . Make healthy food choices . Begin to limit portion sizes . Limited concentrated sugars and fried foods . Keep fat/sugar in the single digits per serving on             food labels . Practice CHEWING your food  (aim for 30 chews per bite or until applesauce consistency) . Practice not drinking 15 minutes before, during, and 30 minutes after each meal/snack . Avoid all carbonated beverages  . Avoid/limit caffeinated beverages  . Avoid all sugar-sweetened beverages . Consume 3 meals per day; eat every 3-5 hours . Make a list of non-food related activities . Aim for 64-100 ounces of  FLUID daily  . Aim for at least 60-80 grams of PROTEIN daily . Look for a liquid protein source that contain ?15 g protein and ?5 g carbohydrate  (ex: shakes, drinks, shots)  -Follow diet recommendations listed below   Energy and Macronutrient Recomendations: Calories: 1800 Carbohydrate: 200 Protein: 135 Fat: 50  Demonstrated degree of understanding via:  Teach Back  Teaching Method Utilized:  Visual Auditory Hands on  Barriers to learning/adherence to lifestyle change: none identified  Patient to call the Nutrition and Diabetes Education Services to enroll in Pre-Op and Post-Op Nutrition Education when surgery date is scheduled.

## 2017-12-09 MED ORDER — METFORMIN HCL 500 MG PO TABS: 500.0000 mg | ORAL_TABLET | Freq: Every day | ORAL | 0 refills | Status: DC

## 2017-12-09 NOTE — Progress Notes (Signed)
Office: [phone]  /  Fax: [phone]   HPI:   Chief Complaint: OBESITY Cory Little is here to discuss his progress with his obesity treatment plan. He is on the  keep a food journal with 1400 to 1800 calories and 100 grams of protein  and is following his eating plan approximately 50 % of the time. He states he is walking 60 minutes 5 to 6 times per week. Cory Little was instructed to keep a specific food journal with calorie and protein goals, but instead, he did portion control and make smarter food choices, which is weight maintenance strategy. He is considering weight loss surgery and wants lap band surgery. His weight is 269 lb (122 kg) today and has had a weight loss of 1 pound over a period of 3 weeks since his last visit. He has gained 2 lbs since starting treatment with [name].  Vitamin D deficiency Cory Little has a diagnosis of vitamin D deficiency. He is stable on vit D but is not yet at goal. Cory Little denies nausea, vomiting or muscle weakness.  Pre-Diabetes Cory Little has a diagnosis of prediabetes based on his elevated Hgb A1c and was informed this puts him at greater risk of developing diabetes. He is stable on metformin currently and continues to work on diet and exercise to decrease risk of diabetes. He denies nausea, vomiting or hypoglycemia. Cory Little is doing well on his diet prescription.  ALLERGIES: No Known Allergies  MEDICATIONS: Current Outpatient Medications on File Prior to Visit  Medication Sig Dispense Refill  . ASPIRIN PO Take by mouth.    [name] buPROPion (WELLBUTRIN XL) 150 MG 24 hr tablet Take 1 tablet (150 mg total) by mouth daily. 90 tablet 1  . COD LIVER OIL PO Take by mouth.    . Coenzyme Q10 (COQ10 PO) Take by mouth.    . [facility] MELATONIN PO Take by mouth.    . Ginkgo Biloba (GINKOBA PO) Take by mouth.    . Lorcaserin HCl (BELVIQ) 10 MG TABS Take 1 tablet by mouth 2 (two) times daily. 60 tablet 0  . LYSINE PO Take by mouth.    [name] MAGNESIUM PO Take by mouth.    .  metFORMIN (GLUCOPHAGE) 500 MG tablet Take 1 tablet (500 mg total) by mouth daily with breakfast. 30 tablet 0  . Multiple Vitamins-Minerals (CENTRUM ADULTS PO) Take by mouth.    . telmisartan-hydrochlorothiazide (MICARDIS HCT) 80-25 MG tablet Take 1 tablet by mouth daily. 30 tablet 1   No current facility-administered medications on file prior to visit.     PAST MEDICAL HISTORY: Past Medical History:  Diagnosis Date  . ADD (attention deficit disorder)   . Chest pain   . Chest tightness   . Depression   . GERD (gastroesophageal reflux disease)   . Hypertension   . Intolerance to cold   . Kidney stone 2004  . Sleep apnea     PAST SURGICAL HISTORY: Past Surgical History:  Procedure Laterality Date  . facial liposuction    . WISDOM TOOTH EXTRACTION  2016    SOCIAL HISTORY: Social History   Tobacco Use  . Smoking status: Former Smoker    Types: Cigarettes  . Smokeless tobacco: Never Used  Substance Use Topics  . Alcohol use: No    Comment: socially  . Drug use: Yes    Types: Marijuana    FAMILY HISTORY: Family History  Problem Relation Age of Onset  . Sleep apnea Father   . Depression Mother  ROS: Review of Systems  Constitutional: Positive for weight loss.  Gastrointestinal: Negative for nausea and vomiting.  Musculoskeletal:       Negative for muscle weakness  Endo/Heme/Allergies:       Negative for hypoglycemia    PHYSICAL EXAM: Blood pressure 125/78, pulse 71, temperature 98.2 F (36.8 C), temperature source Oral, height  (1.803 m), weight 269 lb (122 kg), SpO2 96 %. Body mass index is 37.52 kg/m. Physical Exam  Constitutional: He is oriented to person, place, and time. He appears well-developed and well-nourished.  Cardiovascular: Normal rate.  Pulmonary/Chest: Effort normal.  Musculoskeletal: Normal range of motion.  Neurological: He is oriented to person, place, and time.  Skin: Skin is warm.  Psychiatric: He has a normal mood and  affect. His behavior is normal.  Vitals reviewed.   RECENT LABS AND TESTS: BMET    Component Value Date/Time   NA 140 09/08/2017 1339   K 4.2 09/08/2017 1339   CL 103 09/08/2017 1339   CO2 22 09/08/2017 1339   GLUCOSE 83 09/08/2017 1339   GLUCOSE 82 08/14/2017 1601   BUN 17 09/08/2017 1339   CREATININE 0.99 09/08/2017 1339   CALCIUM 8.9 09/08/2017 1339   GFRNONAA 100 09/08/2017 1339   GFRAA 115 09/08/2017 1339   Lab Results  Component Value Date   HGBA1C 5.4 09/08/2017   HGBA1C 5.5 04/16/2017   Lab Results  Component Value Date   INSULIN 17.8 09/08/2017   CBC    Component Value Date/Time   WBC 6.6 09/08/2017 1339   WBC 7.6 04/16/2017 1404   RBC 5.12 09/08/2017 1339   RBC 5.02 04/16/2017 1404   HGB 15.6 09/08/2017 1339   HCT 44.8 09/08/2017 1339   PLT 230.0 04/16/2017 1404   MCV 88 09/08/2017 1339   MCH 30.5 09/08/2017 1339   MCH 30.2 12/21/2016 2026   MCHC 34.8 09/08/2017 1339   MCHC 34.5 04/16/2017 1404   RDW 14.0 09/08/2017 1339   LYMPHSABS 2.4 09/08/2017 1339   MONOABS 0.6 04/16/2017 1404   EOSABS 0.2 09/08/2017 1339   BASOSABS 0.1 09/08/2017 1339   Iron/TIBC/Ferritin/ %Sat No results found for: IRON, TIBC, FERRITIN, IRONPCTSAT Lipid Panel     Component Value Date/Time   CHOL 217 (H) 09/08/2017 1339   TRIG 165 (H) 09/08/2017 1339   HDL 35 (L) 09/08/2017 1339   CHOLHDL 6 04/16/2017 1404   VLDL 56.2 (H) 04/16/2017 1404   LDLCALC 149 (H) 09/08/2017 1339   LDLDIRECT 155.0 04/16/2017 1404   Hepatic Function Panel     Component Value Date/Time   PROT 6.8 09/08/2017 1339   ALBUMIN 4.4 09/08/2017 1339   AST 22 09/08/2017 1339   ALT 40 09/08/2017 1339   ALKPHOS 65 09/08/2017 1339   BILITOT 0.6 09/08/2017 1339      Component Value Date/Time   TSH 0.661 09/08/2017 1339   TSH 1.32 11/23/2015 1624   TSH 0.89 02/16/2009 0000   Results for Cory Little (MRN [id number]) as of 12/09/2017 07:52  Ref. Range 09/08/2017 13:39  Vitamin D, 25-Hydroxy  Latest Ref Range: 30.0 - 100.0 ng/mL 26.7 (L)   ASSESSMENT AND PLAN: Class 2 severe obesity with serious comorbidity and body mass index (BMI) of 37.0 to 37.9 in adult, unspecified obesity type (HCC)  Vitamin D deficiency - Plan: Vitamin D, Ergocalciferol, (DRISDOL) 50000 units CAPS capsule  PLAN:  Vitamin D Deficiency Shervin was informed that low vitamin D levels contributes to fatigue and are associated with obesity, breast, and  colon cancer. He agrees to continue to take prescription Vit D ,000 IU every week #4 with no refills and will follow up for routine testing of vitamin D, at least 2-3 times per year. He was informed of the risk of over-replacement of vitamin D and agrees to not increase his dose unless he discusses this with [name] first. Clayborn agrees to follow up with our clinic in 4 weeks.  Pre-Diabetes Tyion will continue to work on weight loss, exercise, and decreasing simple carbohydrates in his diet to help decrease the risk of diabetes. We dicussed metformin including benefits and risks. He was informed that eating too many simple carbohydrates or too many calories at one sitting increases the likelihood of GI side effects. Beulah Gandy requested metformin for now and a prescription was written today for 1 month refill. Isreal agreed to follow up with [name] as directed to monitor his progress.  Obesity Lamine was educated on the limitations of lap band surgery versus sleeve or RNY. I am uncertain that patient is in the action state of change as he has been resistant to making any of the changes we have asked him to do As such, his goal is to maintain weight for now He has agreed to portion control better and make smarter food choices, such as increase vegetables and decrease simple carbohydrates  Junius has been instructed to work up to a goal of 150 minutes of combined cardio and strengthening exercise per week for weight loss and overall health benefits. We discussed the  following Behavioral Modification Strategies today: increasing lean protein intake Aydian has agreed to follow up with our clinic in 4 weeks. He was informed of the importance of frequent follow up visits to maximize his success with intensive lifestyle modifications for his multiple health conditions.   OBESITY BEHAVIORAL INTERVENTION VISIT  Today's visit was # 6 out of 22.  Starting weight: 267 lbs Starting date: 09/08/17 Today's weight : 269 lbs  Today's date: 12/08/2017 Total lbs lost to date: 0 (Patients must lose 7 lbs in the first 6 months to continue with counseling)   ASK: We discussed the diagnosis of obesity with [name] today and Arshia agreed to give [name] permission to discuss obesity behavioral modification therapy today.  ASSESS: Finley has the diagnosis of obesity and his BMI today is 39.23 Timmey is in the action stage of change   ADVISE: Santos was educated on the multiple health risks of obesity as well as the benefit of weight loss to improve his health. He was advised of the need for long term treatment and the importance of lifestyle modifications.  AGREE: Multiple dietary modification options and treatment options were discussed and  Tayshun agreed to the above obesity treatment plan.  I, [place] Crane, am acting as transcriptionist for Quillian Quince, MD  I have reviewed the above documentation for accuracy and completeness, and I agree with the above. -Quillian Quince, MD

## 2018-01-05 ENCOUNTER — Encounter: Payer: BLUE CROSS/BLUE SHIELD | Attending: General Surgery | Admitting: Skilled Nursing Facility1

## 2018-01-05 ENCOUNTER — Ambulatory Visit (INDEPENDENT_AMBULATORY_CARE_PROVIDER_SITE_OTHER): Payer: BLUE CROSS/BLUE SHIELD | Admitting: Family Medicine

## 2018-01-05 VITALS — BP 125/84 | HR 67 | Temp 97.7°F | Ht 71.0 in | Wt 272.0 lb

## 2018-01-05 DIAGNOSIS — Z9189 Other specified personal risk factors, not elsewhere classified: Secondary | ICD-10-CM

## 2018-01-05 DIAGNOSIS — Z713 Dietary counseling and surveillance: Secondary | ICD-10-CM | POA: Insufficient documentation

## 2018-01-05 DIAGNOSIS — Z8489 Family history of other specified conditions: Secondary | ICD-10-CM | POA: Insufficient documentation

## 2018-01-05 DIAGNOSIS — R7303 Prediabetes: Secondary | ICD-10-CM | POA: Diagnosis not present

## 2018-01-05 DIAGNOSIS — Z87891 Personal history of nicotine dependence: Secondary | ICD-10-CM | POA: Diagnosis not present

## 2018-01-05 DIAGNOSIS — Z79899 Other long term (current) drug therapy: Secondary | ICD-10-CM | POA: Diagnosis not present

## 2018-01-05 DIAGNOSIS — Z7982 Long term (current) use of aspirin: Secondary | ICD-10-CM | POA: Insufficient documentation

## 2018-01-05 DIAGNOSIS — Z6839 Body mass index (BMI) 39.0-39.9, adult: Secondary | ICD-10-CM | POA: Diagnosis not present

## 2018-01-05 DIAGNOSIS — E559 Vitamin D deficiency, unspecified: Secondary | ICD-10-CM

## 2018-01-05 DIAGNOSIS — K219 Gastro-esophageal reflux disease without esophagitis: Secondary | ICD-10-CM | POA: Diagnosis not present

## 2018-01-05 DIAGNOSIS — Z6837 Body mass index (BMI) 37.0-37.9, adult: Secondary | ICD-10-CM

## 2018-01-05 DIAGNOSIS — Z818 Family history of other mental and behavioral disorders: Secondary | ICD-10-CM | POA: Insufficient documentation

## 2018-01-05 DIAGNOSIS — Z6838 Body mass index (BMI) 38.0-38.9, adult: Secondary | ICD-10-CM

## 2018-01-05 MED ORDER — VITAMIN D (ERGOCALCIFEROL) 1.25 MG (50000 UNIT) PO CAPS: 50000.0000 [IU] | ORAL_CAPSULE | ORAL | 0 refills | Status: DC

## 2018-01-05 MED ORDER — METFORMIN HCL 500 MG PO TABS: 500.0000 mg | ORAL_TABLET | Freq: Every day | ORAL | 0 refills | Status: DC

## 2018-01-05 NOTE — Progress Notes (Signed)
Office: [phone]  /  Fax: [phone]   HPI:   Chief Complaint: OBESITY Cory Little is here to discuss his progress with his obesity treatment plan. He is on the portion control better and make smarter food choices plan and is following his eating plan approximately 50 % of the time. He states he is walking for 60 minutes 5 times per week. [name] has increased walking outdoors at the park on dry days. He is not following a formal plan. He is mostly just trying to increase vegetables and protein. [name] is not journaling. He wants lap-band surgery and has a pre-op nutrition class tonight. His weight is 272 lb (123.4 kg) today and has had a weight gain of 3 pounds over a period of 4 weeks since his last visit. He has gained 5 lbs since starting treatment with [name].  Pre-Diabetes [name] has a diagnosis of prediabetes based on his elevated Hgb A1c and was informed this puts him at greater risk of developing diabetes. He is stable on metformin currently and continues to work on diet and exercise to decrease risk of diabetes. He denies nausea, vomiting or hypoglycemia. He is not following his diet prescription well.  At risk for diabetes [name] is at higher than average risk for developing diabetes due to his obesity and pre-diabetes. He currently denies polyuria or polydipsia.  Vitamin D deficiency [name] has a diagnosis of vitamin D deficiency. [name] is stable on vit D, but he is not yet at goal. He denies nausea, vomiting or muscle weakness.  ALLERGIES: No Known Allergies  MEDICATIONS: Current Outpatient Medications on File Prior to Visit  Medication Sig Dispense Refill  . ASPIRIN PO Take by mouth.    [name] buPROPion (WELLBUTRIN XL) 150 MG 24 hr tablet Take 1 tablet (150 mg total) by mouth daily. 90 tablet 1  . COD LIVER OIL PO Take by mouth.    . Coenzyme Q10 (COQ10 PO) Take by mouth.    . [facility] MELATONIN PO Take by mouth.    . Ginkgo Biloba (GINKOBA PO) Take by mouth.    .  Lorcaserin HCl (BELVIQ) 10 MG TABS Take 1 tablet by mouth 2 (two) times daily. 60 tablet 0  . LYSINE PO Take by mouth.    [name] MAGNESIUM PO Take by mouth.    . Multiple Vitamins-Minerals (CENTRUM ADULTS PO) Take by mouth.    . telmisartan-hydrochlorothiazide (MICARDIS HCT) 80-25 MG tablet Take 1 tablet by mouth daily. 30 tablet 1   No current facility-administered medications on file prior to visit.     PAST MEDICAL HISTORY: Past Medical History:  Diagnosis Date  . ADD (attention deficit disorder)   . Chest pain   . Chest tightness   . Depression   . GERD (gastroesophageal reflux disease)   . Hypertension   . Intolerance to cold   . Kidney stone 2004  . Sleep apnea     PAST SURGICAL HISTORY: Past Surgical History:  Procedure Laterality Date  . facial liposuction    . WISDOM TOOTH EXTRACTION  2016    SOCIAL HISTORY: Social History   Tobacco Use  . Smoking status: Former Smoker    Types: Cigarettes  . Smokeless tobacco: Never Used  Substance Use Topics  . Alcohol use: No    Comment: socially  . Drug use: Yes    Types: Marijuana    FAMILY HISTORY: Family History  Problem Relation Age of Onset  . Sleep apnea Father   . Depression Mother  ROS: Review of Systems  Constitutional: Negative for weight loss.  Gastrointestinal: Negative for nausea and vomiting.  Genitourinary: Negative for frequency.  Musculoskeletal:       Negative for muscle weakness  Endo/Heme/Allergies: Negative for polydipsia.       Negative for hypoglycemia    PHYSICAL EXAM: Blood pressure 125/84, pulse 67, temperature 97.7 F (36.5 C), temperature source Oral, height 5\' 11"  (1.803 m), weight 272 lb (123.4 kg), SpO2 96 %. Body mass index is 37.94 kg/m. Physical Exam  Constitutional: He is oriented to person, place, and time. He appears well-developed and well-nourished.  Cardiovascular: Normal rate.  Pulmonary/Chest: Effort normal.  Musculoskeletal: Normal range of motion.    Neurological: He is oriented to person, place, and time.  Skin: Skin is warm and dry.  Psychiatric: He has a normal mood and affect. His behavior is normal.  Vitals reviewed.   RECENT LABS AND TESTS: BMET    Component Value Date/Time   NA 140 09/08/2017 1339   K 4.2 09/08/2017 1339   CL 103 09/08/2017 1339   CO2 22 09/08/2017 1339   GLUCOSE 83 09/08/2017 1339   GLUCOSE 82 08/14/2017 1601   BUN 17 09/08/2017 1339   CREATININE 0.99 09/08/2017 1339   CALCIUM 8.9 09/08/2017 1339   GFRNONAA 100 09/08/2017 1339   GFRAA 115 09/08/2017 1339   Lab Results  Component Value Date   HGBA1C 5.4 09/08/2017   HGBA1C 5.5 04/16/2017   Lab Results  Component Value Date   INSULIN 17.8 09/08/2017   CBC    Component Value Date/Time   WBC 6.6 09/08/2017 1339   WBC 7.6 04/16/2017 1404   RBC 5.12 09/08/2017 1339   RBC 5.02 04/16/2017 1404   HGB 15.6 09/08/2017 1339   HCT 44.8 09/08/2017 1339   PLT 230.0 04/16/2017 1404   MCV 88 09/08/2017 1339   MCH 30.5 09/08/2017 1339   MCH 30.2 12/21/2016 2026   MCHC 34.8 09/08/2017 1339   MCHC 34.5 04/16/2017 1404   RDW 14.0 09/08/2017 1339   LYMPHSABS 2.4 09/08/2017 1339   MONOABS 0.6 04/16/2017 1404   EOSABS 0.2 09/08/2017 1339   BASOSABS 0.1 09/08/2017 1339   Iron/TIBC/Ferritin/ %Sat No results found for: IRON, TIBC, FERRITIN, IRONPCTSAT Lipid Panel     Component Value Date/Time   CHOL 217 (H) 09/08/2017 1339   TRIG 165 (H) 09/08/2017 1339   HDL 35 (L) 09/08/2017 1339   CHOLHDL 6 04/16/2017 1404   VLDL 56.2 (H) 04/16/2017 1404   LDLCALC 149 (H) 09/08/2017 1339   LDLDIRECT 155.0 04/16/2017 1404   Hepatic Function Panel     Component Value Date/Time   PROT 6.8 09/08/2017 1339   ALBUMIN 4.4 09/08/2017 1339   AST 22 09/08/2017 1339   ALT 40 09/08/2017 1339   ALKPHOS 65 09/08/2017 1339   BILITOT 0.6 09/08/2017 1339      Component Value Date/Time   TSH 0.661 09/08/2017 1339   TSH 1.32 11/23/2015 1624   TSH 0.89 02/16/2009 0000    Results for [name], [name] (MRN [id number]) as of 01/05/2018 12:39  Ref. Range 09/08/2017 13:39  Vitamin D, 25-Hydroxy Latest Ref Range: 30.0 - 100.0 ng/mL 26.7 (L)   ASSESSMENT AND PLAN: Prediabetes - Plan: metFORMIN (GLUCOPHAGE) 500 MG tablet  Vitamin D deficiency - Plan: Vitamin D, Ergocalciferol, (DRISDOL) 50000 units CAPS capsule  At risk for diabetes mellitus  Class 2 severe obesity with serious comorbidity and body mass index (BMI) of 38.0 to 38.9 in adult, unspecified obesity  type [facility](HCC)  PLAN:  Pre-Diabetes Beulah GandyRudolph will continue to work on weight loss, exercise, and decreasing simple carbohydrates in his diet to help decrease the risk of diabetes. We dicussed metformin including benefits and risks. He was informed that eating too many simple carbohydrates or too many calories at one sitting increases the likelihood of GI side effects. Beulah Gandyudolph requested metformin for now and a prescription was written today for 1 month refill. Beulah GandyRudolph agreed to follow up with us as directed to monitor his progress.  Diabetes risk counseling Beulah GandyRudolph was given extended (15 minutes) diabetes prevention counseling today. He is 32 y.o. male and has risk factors for diabetes including obesity and pre-diabetes. We discussed intensive lifestyle modifications today with an emphasis on weight loss as well as increasing exercise and decreasing simple carbohydrates in his diet.  Vitamin D Deficiency Beulah GandyRudolph was informed that low vitamin D levels contributes to fatigue and are associated with obesity, breast, and colon cancer. He agrees to continue to take prescription Vit D @50 ,000 IU every week #4 with no refills and will follow up for routine testing of vitamin D, at least 2-3 times per year. He was informed of the risk of over-replacement of vitamin D and agrees to not increase his dose unless he discusses this with us first. Beulah GandyRudolph agrees to follow up as directed.  Obesity Beulah GandyRudolph is currently in the  action stage of change. As such, his goal is to continue with weight loss efforts He has agreed to portion control better and make smarter food choices, such as increase vegetables and decrease simple carbohydrates  Beulah GandyRudolph has been instructed to work up to a goal of 150 minutes of combined cardio and add strengthening exercise per week for weight loss and overall health benefits. We discussed the following Behavioral Modification Strategies today: increasing lean protein intake, increasing vegetables and work on meal planning and easy cooking plans   Beulah GandyRudolph has agreed to follow up with our clinic in 4 weeks. He was informed of the importance of frequent follow up visits to maximize his success with intensive lifestyle modifications for his multiple health conditions.   OBESITY BEHAVIORAL INTERVENTION VISIT  Today's visit was # 7 out of 22.  Starting weight: 267 lbs Starting date: 09/08/17 Today's weight : 272 lbs Today's date: 01/05/2018 Total lbs lost to date: 0 (Patients must lose 7 lbs in the first 6 months to continue with counseling)   ASK: We discussed the diagnosis of obesity with Virgel Palingudolph L Zavadil today and Beulah GandyRudolph agreed to give us permission to discuss obesity behavioral modification therapy today.  ASSESS: Beulah GandyRudolph has the diagnosis of obesity and his BMI today is 37.95 Beulah GandyRudolph is in the action stage of change   ADVISE: Beulah GandyRudolph was educated on the multiple health risks of obesity as well as the benefit of weight loss to improve his health. He was advised of the need for long term treatment and the importance of lifestyle modifications.  AGREE: Multiple dietary modification options and treatment options were discussed and  Beulah GandyRudolph agreed to the above obesity treatment plan.  I, [place] CraneJoanne Murray, am acting as transcriptionist for Quillian Quincearen Zinia Innocent, MD  I have reviewed the above documentation for accuracy and completeness, and I agree with the above. -Quillian Quincearen Toa Mia, MD

## 2018-01-06 ENCOUNTER — Encounter: Payer: Self-pay | Admitting: Internal Medicine

## 2018-01-06 ENCOUNTER — Other Ambulatory Visit (INDEPENDENT_AMBULATORY_CARE_PROVIDER_SITE_OTHER): Payer: BLUE CROSS/BLUE SHIELD

## 2018-01-06 ENCOUNTER — Ambulatory Visit (INDEPENDENT_AMBULATORY_CARE_PROVIDER_SITE_OTHER): Payer: BLUE CROSS/BLUE SHIELD | Admitting: Internal Medicine

## 2018-01-06 VITALS — BP 118/88 | HR 74 | Temp 97.9°F | Resp 16 | Ht 71.0 in | Wt 278.0 lb

## 2018-01-06 DIAGNOSIS — I10 Essential (primary) hypertension: Secondary | ICD-10-CM | POA: Diagnosis not present

## 2018-01-06 DIAGNOSIS — F900 Attention-deficit hyperactivity disorder, predominantly inattentive type: Secondary | ICD-10-CM

## 2018-01-06 DIAGNOSIS — F321 Major depressive disorder, single episode, moderate: Secondary | ICD-10-CM | POA: Diagnosis not present

## 2018-01-06 LAB — BASIC METABOLIC PANEL
BUN: 16 mg/dL (ref 6–23)
CO2: 27 mEq/L (ref 19–32)
Calcium: 9.5 mg/dL (ref 8.4–10.5)
Chloride: 105 mEq/L (ref 96–112)
Creatinine, Ser: 1.06 mg/dL (ref 0.40–1.50)
GFR: 85.08 mL/min (ref >60.00)
Glucose, Bld: 90 mg/dL (ref 70–99)
Potassium: 3.8 mEq/L (ref 3.5–5.1)
Sodium: 139 mEq/L (ref 135–145)

## 2018-01-06 MED ORDER — AMPHETAMINE-DEXTROAMPHET ER 20 MG PO CP24: 20.0000 mg | ORAL_CAPSULE | Freq: Every day | ORAL | 0 refills | Status: DC

## 2018-01-06 MED ORDER — VORTIOXETINE HBR 5 MG PO TABS: 5.0000 mg | ORAL_TABLET | Freq: Every day | ORAL | 0 refills | Status: DC

## 2018-01-06 NOTE — Progress Notes (Signed)
Subjective:  Patient ID: Cory Little, male    DOB: 07/30/1985  Age: 32 y.o. MRN: [id number]  CC: Depression and Hypertension   HPI Cory Little presents for f/up - He stopped taking Wellbutrin because he felt like it caused tremors.  The tremors have since stopped.  He continues to complain of depressive symptoms with anhedonia, irritability, lack of focus, poor concentration, and difficulty completing tasks.  He has a history of ADHD.  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  He wants to try Adderall to help with his focus.  Outpatient Medications Prior to Visit  Medication Sig Dispense Refill  . ASPIRIN PO Take by mouth.    . COD LIVER OIL PO Take by mouth.    . Coenzyme Q10 (COQ10 PO) Take by mouth.    . [facility] MELATONIN PO Take by mouth.    . Ginkgo Biloba (GINKOBA PO) Take by mouth.    . LYSINE PO Take by mouth.    [name]. MAGNESIUM PO Take by mouth.    . metFORMIN (GLUCOPHAGE) 500 MG tablet Take 1 tablet (500 mg total) by mouth daily with breakfast. 30 tablet 0  . Multiple Vitamins-Minerals (CENTRUM ADULTS PO) Take by mouth.    . telmisartan-hydrochlorothiazide (MICARDIS HCT) 80-25 MG tablet Take 1 tablet by mouth daily. 30 tablet 1  . Vitamin D, Ergocalciferol, (DRISDOL) 50000 units CAPS capsule Take 1 capsule (50,000 Units total) by mouth every 7 (seven) days. 4 capsule 0  . buPROPion (WELLBUTRIN XL) 150 MG 24 hr tablet Take 1 tablet (150 mg total) by mouth daily. (Patient not taking: Reported on 01/06/2018) 90 tablet 1  . Lorcaserin HCl (BELVIQ) 10 MG TABS Take 1 tablet by mouth 2 (two) times daily. (Patient not taking: Reported on 01/06/2018) 60 tablet 0   No facility-administered medications prior to visit.     ROS Review of Systems  Constitutional: Negative for diaphoresis and fatigue.  HENT: Negative.  Negative for trouble swallowing.   Eyes: Negative for visual disturbance.  Respiratory:  Negative for cough, chest tightness, shortness of breath and wheezing.   Gastrointestinal: Negative for abdominal pain, constipation, diarrhea, nausea and vomiting.  Endocrine: Negative.   Genitourinary: Negative.  Negative for difficulty urinating.  Musculoskeletal: Negative.   Skin: Negative.   Neurological: Negative.  Negative for dizziness, weakness and light-headedness.  Hematological: Negative for adenopathy. Does not bruise/bleed easily.  Psychiatric/Behavioral: Positive for decreased concentration, dysphoric mood and sleep disturbance. Negative for agitation, behavioral problems, confusion, hallucinations, self-injury and suicidal ideas. The patient is not nervous/anxious and is not hyperactive.     Objective:  BP 118/88 (BP Location: Left Arm, Patient Position: Sitting, Cuff Size: Large)   Pulse 74   Temp 97.9 F (36.6 C)   Resp 16   Ht 5\' 11"  (1.803 m)   Wt 278 lb (126.1 kg)   SpO2 96%   BMI 38.77 kg/m   BP Readings from Last 3 Encounters:  01/06/18 118/88  01/05/18 125/84  12/08/17 125/78    Wt Readings from Last 3 Encounters:  01/06/18 278 lb (126.1 kg)  01/05/18 272 lb (123.4 kg)  12/08/17 273 lb 6.4 oz (124 kg)    Physical Exam  Constitutional: He is oriented to person, place, and time. No distress.  HENT:  Mouth/Throat: Oropharynx is clear and moist. No oropharyngeal exudate.  Eyes: Conjunctivae are normal. No scleral icterus.  Neck: Normal range of  motion. Neck supple. No JVD present. No thyromegaly present.  Cardiovascular: Normal rate, regular rhythm and normal heart sounds.  Pulmonary/Chest: Effort normal and breath sounds normal. No respiratory distress. He has no wheezes. He has no rales.  Abdominal: Soft. Bowel sounds are normal. He exhibits no mass. There is no tenderness.  Musculoskeletal: Normal range of motion. He exhibits no edema, tenderness or deformity.  Lymphadenopathy:    He has no cervical adenopathy.  Neurological: He is alert and  oriented to person, place, and time.  Skin: Skin is warm and dry. No rash noted. He is not diaphoretic.  Psychiatric: He has a normal mood and affect. His behavior is normal. Judgment and thought content normal. His mood appears not anxious. His speech is not rapid and/or pressured, not delayed and not tangential. He is not agitated, not slowed and not withdrawn. Cognition and memory are normal. He does not exhibit a depressed mood. He expresses no homicidal and no suicidal ideation.  Vitals reviewed.   Lab Results  Component Value Date   WBC 6.6 09/08/2017   HGB 15.6 09/08/2017   HCT 44.8 09/08/2017   PLT 230.0 04/16/2017   GLUCOSE 90 01/06/2018   CHOL 217 (H) 09/08/2017   TRIG 165 (H) 09/08/2017   HDL 35 (L) 09/08/2017   LDLDIRECT 155.0 04/16/2017   LDLCALC 149 (H) 09/08/2017   ALT 40 09/08/2017   AST 22 09/08/2017   NA 139 01/06/2018   K 3.8 01/06/2018   CL 105 01/06/2018   CREATININE 1.06 01/06/2018   BUN 16 01/06/2018   CO2 27 01/06/2018   TSH 0.661 09/08/2017   HGBA1C 5.4 09/08/2017    No results found.  Assessment & Plan:   Cory Little was seen today for depression and hypertension.  Diagnoses and all orders for this visit:  Attention deficit hyperactivity disorder (ADHD), predominantly inattentive type -     amphetamine-dextroamphetamine (ADDERALL XR) 20 MG 24 hr capsule; Take 1 capsule (20 mg total) by mouth daily.  Essential hypertension- His blood pressure is well controlled.  Electrolytes and renal function are normal.  Will continue the current combination of an ARB and thiazide diuretic. -     Basic Metabolic Panel (BMET); Future  Current moderate episode of major depressive disorder, unspecified whether recurrent (HCC)- I recommended that he start taking Trintellix.  Will start at 5 mg a day and will increase the dose as indicated. -     vortioxetine HBr (TRINTELLIX) 5 MG TABS tablet; Take 1 tablet (5 mg total) by mouth daily.   I have discontinued Beulah Gandy  L. Ritsema's buPROPion and Lorcaserin HCl. I am also having him start on vortioxetine HBr and amphetamine-dextroamphetamine. Additionally, I am having him maintain his Multiple Vitamins-Minerals (CENTRUM ADULTS PO), Coenzyme Q10 (COQ10 PO), LYSINE PO, CVS MELATONIN PO, COD LIVER OIL PO, Ginkgo Biloba (GINKOBA PO), ASPIRIN PO, MAGNESIUM PO, telmisartan-hydrochlorothiazide, metFORMIN, and Vitamin D (Ergocalciferol).  Meds ordered this encounter  Medications  . vortioxetine HBr (TRINTELLIX) 5 MG TABS tablet    Sig: Take 1 tablet (5 mg total) by mouth daily.    Dispense:  28 tablet    Refill:  0  . amphetamine-dextroamphetamine (ADDERALL XR) 20 MG 24 hr capsule    Sig: Take 1 capsule (20 mg total) by mouth daily.    Dispense:  30 capsule    Refill:  0     Follow-up: Return in about 3 months (around 04/08/2018).  Sanda Linger, MD

## 2018-01-06 NOTE — Patient Instructions (Signed)
Major Depressive Disorder, Adult Major depressive disorder (MDD) is a mental health condition. It may also be called clinical depression or unipolar depression. MDD usually causes feelings of sadness, hopelessness, or helplessness. MDD can also cause physical symptoms. It can interfere with work, school, relationships, and other everyday activities. MDD may be mild, moderate, or severe. It may occur once (single episode major depressive disorder) or it may occur multiple times (recurrent major depressive disorder). What are the causes? The exact cause of this condition is not known. MDD is most likely caused by a combination of things, which may include:  Genetic factors. These are traits that are passed along from parent to child.  Individual factors. Your personality, your behavior, and the way you handle your thoughts and feelings may contribute to MDD. This includes personality traits and behaviors learned from others.  Physical factors, such as: ? Differences in the part of your brain that controls emotion. This part of your brain may be different than it is in people who do not have MDD. ? Long-term (chronic) medical or psychiatric illnesses.  Social factors. Traumatic experiences or major life changes may play a role in the development of MDD.  What increases the risk? This condition is more likely to develop in women. The following factors may also make you more likely to develop MDD:  A family history of depression.  Troubled family relationships.  Abnormally low levels of certain brain chemicals.  Traumatic events in childhood, especially abuse or the loss of a parent.  Being under a lot of stress, or long-term stress, especially from upsetting life experiences or losses.  A history of: ? Chronic physical illness. ? Other mental health disorders. ? Substance abuse.  Poor living conditions.  Experiencing social exclusion or discrimination on a regular basis.  What are  the signs or symptoms? The main symptoms of MDD typically include:  Constant depressed or irritable mood.  Loss of interest in things and activities.  MDD symptoms may also include:  Sleeping or eating too much or too little.  Unexplained weight change.  Fatigue or low energy.  Feelings of worthlessness or guilt.  Difficulty thinking clearly or making decisions.  Thoughts of suicide or of harming others.  Physical agitation or weakness.  Isolation.  Severe cases of MDD may also occur with other symptoms, such as:  Delusions or hallucinations, in which you imagine things that are not real (psychotic depression).  Low-level depression that lasts at least a year (chronic depression or persistent depressive disorder).  Extreme sadness and hopelessness (melancholic depression).  Trouble speaking and moving (catatonic depression).  How is this diagnosed? This condition may be diagnosed based on:  Your symptoms.  Your medical history, including your mental health history. This may involve tests to evaluate your mental health. You may be asked questions about your lifestyle, including any drug and alcohol use, and how long you have had symptoms of MDD.  A physical exam.  Blood tests to rule out other conditions.  You must have a depressed mood and at least four other MDD symptoms most of the day, nearly every day in the same 2-week timeframe before your health care provider can confirm a diagnosis of MDD. How is this treated? This condition is usually treated by mental health professionals, such as psychologists, psychiatrists, and clinical social workers. You may need more than one type of treatment. Treatment may include:  Psychotherapy. This is also called talk therapy or counseling. Types of psychotherapy include: ? Cognitive behavioral   therapy (CBT). This type of therapy teaches you to recognize unhealthy feelings, thoughts, and behaviors, and replace them with  positive thoughts and actions. ? Interpersonal therapy (IPT). This helps you to improve the way you relate to and communicate with others. ? Family therapy. This treatment includes members of your family.  Medicine to treat anxiety and depression, or to help you control certain emotions and behaviors.  Lifestyle changes, such as: ? Limiting alcohol and drug use. ? Exercising regularly. ? Getting plenty of sleep. ? Making healthy eating choices. ? Spending more time outdoors.  Treatments involving stimulation of the brain can be used in situations with extremely severe symptoms, or when medicine or other therapies do not work over time. These treatments include electroconvulsive therapy, transcranial magnetic stimulation, and vagal nerve stimulation. Follow these instructions at home: Activity  Return to your normal activities as told by your health care provider.  Exercise regularly and spend time outdoors as told by your health care provider. General instructions  Take over-the-counter and prescription medicines only as told by your health care provider.  Do not drink alcohol. If you drink alcohol, limit your alcohol intake to no more than 1 drink a day for nonpregnant women and 2 drinks a day for men. One drink equals 12 oz of beer, 5 oz of wine, or 1 oz of hard liquor. Alcohol can affect any antidepressant medicines you are taking. Talk to your health care provider about your alcohol use.  Eat a healthy diet and get plenty of sleep.  Find activities that you enjoy doing, and make time to do them.  Consider joining a support group. Your health care provider may be able to recommend a support group.  Keep all follow-up visits as told by your health care provider. This is important. Where to find more information: National Alliance on Mental Illness  www.nami.org  [place] National Institute of Mental Health  www.nimh.nih.gov  National Suicide Prevention  Lifeline  1-800-273-TALK (8255). This is free, 24-hour help.  Contact a health care provider if:  Your symptoms get worse.  You develop new symptoms. Get help right away if:  You self-harm.  You have serious thoughts about hurting yourself or others.  You see, hear, taste, smell, or feel things that are not present (hallucinate). This information is not intended to replace advice given to you by your health care provider. Make sure you discuss any questions you have with your health care provider. Document Released: 11/09/2012 Document Revised: 03/21/2016 Document Reviewed: 01/24/2016 Elsevier Interactive Patient Education  2018 [employer]  

## 2018-01-07 ENCOUNTER — Encounter: Payer: Self-pay | Admitting: Skilled Nursing Facility1

## 2018-01-07 NOTE — Progress Notes (Signed)
Pre-Operative Nutrition Class:  Appt start time: 2984   End time:  1830.  Patient was seen on 01/07/2018 for Pre-Operative Bariatric Surgery Education at the [facility].   Surgery date:  Surgery type: Lap Band Start weight at [facility]: 273.4 Weight today: 275.6  Samples given per MNT protocol. Patient educated on appropriate usage: Bariatric Advantage Multivitamin Lot # [id number] Exp: 10/20  Bariatric Advantage Calcium  Lot # [id number] Exp: oct-01-2018  Renee Pain Protein  Shake Lot # [id number] Exp: oct-13-19  The following the learning objectives were met by the patient during this course:  Identify Pre-Op Dietary Goals and will begin 2 weeks pre-operatively  Identify appropriate sources of fluids and proteins   State protein recommendations and appropriate sources pre and post-operatively  Identify Post-Operative Dietary Goals and will follow for 2 weeks post-operatively  Identify appropriate multivitamin and calcium sources  Describe the need for physical activity post-operatively and will follow MD recommendations  State when to call healthcare provider regarding medication questions or post-operative complications  Handouts given during class include:  Pre-Op Bariatric Surgery Diet Handout  Protein Shake Handout  Post-Op Bariatric Surgery Nutrition Handout  BELT Program Information Flyer  Support Group Information Flyer  [facility] Bariatric Supplements Price List  [facility]: [facility] diet advancement per MD.

## 2018-01-08 ENCOUNTER — Ambulatory Visit (HOSPITAL_COMMUNITY)
Admission: RE | Admit: 2018-01-08 | Discharge: 2018-01-08 | Disposition: A | Discharge status: Home / Self Care | Payer: BLUE CROSS/BLUE SHIELD | Source: Ambulatory Visit | Attending: General Surgery | Admitting: General Surgery

## 2018-01-08 DIAGNOSIS — I10 Essential (primary) hypertension: Secondary | ICD-10-CM | POA: Diagnosis not present

## 2018-02-04 ENCOUNTER — Ambulatory Visit (INDEPENDENT_AMBULATORY_CARE_PROVIDER_SITE_OTHER): Payer: BLUE CROSS/BLUE SHIELD | Admitting: Family Medicine

## 2018-02-04 VITALS — BP 118/77 | HR 63 | Temp 98.0°F | Ht 71.0 in | Wt 266.0 lb

## 2018-02-04 DIAGNOSIS — Z9189 Other specified personal risk factors, not elsewhere classified: Secondary | ICD-10-CM

## 2018-02-04 DIAGNOSIS — E7849 Other hyperlipidemia: Secondary | ICD-10-CM

## 2018-02-04 DIAGNOSIS — R7303 Prediabetes: Secondary | ICD-10-CM

## 2018-02-04 DIAGNOSIS — E559 Vitamin D deficiency, unspecified: Secondary | ICD-10-CM | POA: Diagnosis not present

## 2018-02-04 DIAGNOSIS — I10 Essential (primary) hypertension: Secondary | ICD-10-CM | POA: Diagnosis not present

## 2018-02-04 DIAGNOSIS — Z6837 Body mass index (BMI) 37.0-37.9, adult: Secondary | ICD-10-CM

## 2018-02-04 MED ORDER — TELMISARTAN-HCTZ 80-25 MG PO TABS: 1.0000 | ORAL_TABLET | Freq: Every day | ORAL | 1 refills | Status: DC

## 2018-02-04 MED ORDER — METFORMIN HCL 500 MG PO TABS: 500.0000 mg | ORAL_TABLET | Freq: Every day | ORAL | 0 refills | Status: DC

## 2018-02-04 NOTE — Progress Notes (Signed)
Office: [phone]  /  Fax: [phone]   HPI:   Chief Complaint: OBESITY Cory Little is here to discuss his progress with his obesity treatment plan. He is on the portion control better and make smarter food choices plan and is following his eating plan approximately 60 % of the time. He states he is walking 60 minutes 7 times per week. Cory Little did really well with weight loss over the last two weeks. He reports skipping lunch daily. Cory Little is walking daily for one hour. He decreased eating out recently. His weight is 266 lb (120.7 kg) today and has had a weight loss of 6 pounds over a period of 4 weeks since his last visit. He has lost 1 lb since starting treatment with [name].  Hypertension Cory Little is a 32 y.o. male with hypertension. He is currently on telmisartan-HCTZ. Cory Little denies chest pain, headache or dizziness. He is working weight loss to help control his blood pressure with the goal of decreasing his risk of heart attack and stroke. Cory Little blood pressure is currently well controlled.  Pre-Diabetes Cory Little has a diagnosis of prediabetes based on his elevated Hgb A1c and was informed this puts him at greater risk of developing diabetes. He is taking metformin currently and continues to work on diet and exercise to decrease risk of diabetes. He denies nausea, diarrhea or hypoglycemia.  At risk for diabetes Cory Little is at higher than average risk for developing diabetes due to his obesity and pre-diabetes. He currently denies polyuria or polydipsia.  Vitamin D deficiency Cory Little has a diagnosis of vitamin D deficiency. He is currently taking prescription vit D (no refill needed today) and denies nausea, vomiting or muscle weakness.  Hyperlipidemia Cory Little has hyperlipidemia and he is on OTC cod liver. He has been trying to improve his cholesterol levels with intensive lifestyle modification including a low saturated fat diet, exercise and weight loss. He denies any  chest pain, claudication or myalgias.  ALLERGIES: Allergies  Allergen Reactions  . Wellbutrin [Bupropion] Other (See Comments)    tremors    MEDICATIONS: Current Outpatient Medications on File Prior to Visit  Medication Sig Dispense Refill  . amphetamine-dextroamphetamine (ADDERALL XR) 20 MG 24 hr capsule Take 1 capsule (20 mg total) by mouth daily. 30 capsule 0  . ASPIRIN PO Take by mouth.    . COD LIVER OIL PO Take by mouth.    . Coenzyme Q10 (COQ10 PO) Take by mouth.    . [facility] MELATONIN PO Take by mouth.    . Ginkgo Biloba (GINKOBA PO) Take by mouth.    . LYSINE PO Take by mouth.    [name] MAGNESIUM PO Take by mouth.    . metFORMIN (GLUCOPHAGE) 500 MG tablet Take 1 tablet (500 mg total) by mouth daily with breakfast. 30 tablet 0  . Multiple Vitamins-Minerals (CENTRUM ADULTS PO) Take by mouth.    . telmisartan-hydrochlorothiazide (MICARDIS HCT) 80-25 MG tablet Take 1 tablet by mouth daily. 30 tablet 1  . Vitamin D, Ergocalciferol, (DRISDOL) 50000 units CAPS capsule Take 1 capsule (50,000 Units total) by mouth every 7 (seven) days. 4 capsule 0  . vortioxetine HBr (TRINTELLIX) 5 MG TABS tablet Take 1 tablet (5 mg total) by mouth daily. 28 tablet 0   No current facility-administered medications on file prior to visit.     PAST MEDICAL HISTORY: Past Medical History:  Diagnosis Date  . ADD (attention deficit disorder)   . Chest pain   . Chest tightness   .  Depression   . GERD (gastroesophageal reflux disease)   . Hypertension   . Intolerance to cold   . Kidney stone 2004  . Sleep apnea     PAST SURGICAL HISTORY: Past Surgical History:  Procedure Laterality Date  . facial liposuction    . WISDOM TOOTH EXTRACTION  2016    SOCIAL HISTORY: Social History   Tobacco Use  . Smoking status: Former Smoker    Types: Cigarettes  . Smokeless tobacco: Never Used  Substance Use Topics  . Alcohol use: No    Comment: socially  . Drug use: Yes    Types: Marijuana    FAMILY  HISTORY: Family History  Problem Relation Age of Onset  . Sleep apnea Father   . Depression Mother     ROS: Review of Systems  Constitutional: Positive for weight loss.  Cardiovascular: Negative for chest pain and claudication.  Gastrointestinal: Negative for diarrhea, nausea and vomiting.  Genitourinary: Negative for frequency.  Musculoskeletal: Negative for myalgias.       Negative for muscle weakness  Neurological: Negative for dizziness and headaches.  Endo/Heme/Allergies: Negative for polydipsia.       Negative for hypoglycemia    PHYSICAL EXAM: Blood pressure 118/77, pulse 63, temperature 98 F (36.7 C), temperature source Oral, height 5\' 11"  (1.803 m), weight 266 lb (120.7 kg), SpO2 98 %. Body mass index is 37.1 kg/m. Physical Exam  Constitutional: He is oriented to person, place, and time. He appears well-developed and well-nourished.  Cardiovascular: Normal rate.  Pulmonary/Chest: Effort normal.  Musculoskeletal: Normal range of motion.  Neurological: He is oriented to person, place, and time.  Skin: Skin is warm and dry.  Psychiatric: He has a normal mood and affect. His behavior is normal.  Vitals reviewed.   RECENT LABS AND TESTS: BMET    Component Value Date/Time   NA 139 01/06/2018 1555   NA 140 09/08/2017 1339   K 3.8 01/06/2018 1555   CL 105 01/06/2018 1555   CO2 27 01/06/2018 1555   GLUCOSE 90 01/06/2018 1555   BUN 16 01/06/2018 1555   BUN 17 09/08/2017 1339   CREATININE 1.06 01/06/2018 1555   CALCIUM 9.5 01/06/2018 1555   GFRNONAA 100 09/08/2017 1339   GFRAA 115 09/08/2017 1339   Lab Results  Component Value Date   HGBA1C 5.4 09/08/2017   HGBA1C 5.5 04/16/2017   Lab Results  Component Value Date   INSULIN 17.8 09/08/2017   CBC    Component Value Date/Time   WBC 6.6 09/08/2017 1339   WBC 7.6 04/16/2017 1404   RBC 5.12 09/08/2017 1339   RBC 5.02 04/16/2017 1404   HGB 15.6 09/08/2017 1339   HCT 44.8 09/08/2017 1339   PLT 230.0  04/16/2017 1404   MCV 88 09/08/2017 1339   MCH 30.5 09/08/2017 1339   MCH 30.2 12/21/2016 2026   MCHC 34.8 09/08/2017 1339   MCHC 34.5 04/16/2017 1404   RDW 14.0 09/08/2017 1339   LYMPHSABS 2.4 09/08/2017 1339   MONOABS 0.6 04/16/2017 1404   EOSABS 0.2 09/08/2017 1339   BASOSABS 0.1 09/08/2017 1339   Iron/TIBC/Ferritin/ %Sat No results found for: IRON, TIBC, FERRITIN, IRONPCTSAT Lipid Panel     Component Value Date/Time   CHOL 217 (H) 09/08/2017 1339   TRIG 165 (H) 09/08/2017 1339   HDL 35 (L) 09/08/2017 1339   CHOLHDL 6 04/16/2017 1404   VLDL 56.2 (H) 04/16/2017 1404   LDLCALC 149 (H) 09/08/2017 1339   LDLDIRECT 155.0 04/16/2017 1404  Hepatic Function Panel     Component Value Date/Time   PROT 6.8 09/08/2017 1339   ALBUMIN 4.4 09/08/2017 1339   AST 22 09/08/2017 1339   ALT 40 09/08/2017 1339   ALKPHOS 65 09/08/2017 1339   BILITOT 0.6 09/08/2017 1339      Component Value Date/Time   TSH 0.661 09/08/2017 1339   TSH 1.32 11/23/2015 1624   TSH 0.89 02/16/2009 0000   Results for YAIR, DUSZA (MRN [id number]) as of 02/04/2018 10:55  Ref. Range 09/08/2017 13:39  Vitamin D, 25-Hydroxy Latest Ref Range: 30.0 - 100.0 ng/mL 26.7 (L)   ASSESSMENT AND PLAN: Essential hypertension - Plan: telmisartan-hydrochlorothiazide (MICARDIS HCT) 80-25 MG tablet  Prediabetes - Plan: metFORMIN (GLUCOPHAGE) 500 MG tablet  Vitamin D deficiency  Other hyperlipidemia  At risk for diabetes mellitus  Class 2 severe obesity with serious comorbidity and body mass index (BMI) of 37.0 to 37.9 in adult, unspecified obesity type (HCC)  PLAN:  Hypertension We discussed sodium restriction, working on healthy weight loss, and a regular exercise program as the means to achieve improved blood pressure control. Cory Little agreed with this plan and agreed to follow up as directed. We will continue to monitor his blood pressure as well as his progress with the above lifestyle modifications. He agrees  to continue telmisartan-HCTZ  80-25 mg qd #30 with no refills and will watch for signs of hypotension as he continues his lifestyle modifications.  Pre-Diabetes Cory Little will continue to work on weight loss, exercise, and decreasing simple carbohydrates in his diet to help decrease the risk of diabetes. We dicussed metformin including benefits and risks. He was informed that eating too many simple carbohydrates or too many calories at one sitting increases the likelihood of GI side effects. Cory Little requested metformin for now and a prescription was written today for 1 month refill. We will recheck A1c and insulin today. Cory Little agreed to follow up with [name] as directed to monitor his progress.  Diabetes risk counseling Cory Little was given extended (15 minutes) diabetes prevention counseling today. He is 32 y.o. male and has risk factors for diabetes including obesity and pre-diabetes. We discussed intensive lifestyle modifications today with an emphasis on weight loss as well as increasing exercise and decreasing simple carbohydrates in his diet.  Vitamin D Deficiency Cory Little was informed that low vitamin D levels contributes to fatigue and are associated with obesity, breast, and colon cancer. He agrees to continue to take prescription Vit D @50 ,000 IU every week. We will recheck vitamin D level today  and he will follow up for routine testing of vitamin D, at least 2-3 times per year. He was informed of the risk of over-replacement of vitamin D and agrees to not increase his dose unless he discusses this with [name] first.  Hyperlipidemia Cory Little was informed of the American Heart Association Guidelines emphasizing intensive lifestyle modifications as the first line treatment for hyperlipidemia. We discussed many lifestyle modifications today in depth, and Kyran will continue to work on decreasing saturated fats such as fatty red meat, butter and many fried foods. He will also increase vegetables and lean  protein in his diet and continue to work on exercise and weight loss efforts. We will check lipid panel today and Cory Little will follow up as directed.  Obesity Cory Little is currently in the action stage of change. As such, his goal is to continue with weight loss efforts He has agreed to portion control better and make smarter food choices, such as increase vegetables  and decrease simple carbohydrates  Cory Little has been instructed to work up to a goal of 150 minutes of combined cardio and strengthening exercise per week for weight loss and overall health benefits. We discussed the following Behavioral Modification Strategies today: increasing lean protein intake and no skipping meals  Cory Little has agreed to follow up with our clinic in 6 weeks. He was informed of the importance of frequent follow up visits to maximize his success with intensive lifestyle modifications for his multiple health conditions.   OBESITY BEHAVIORAL INTERVENTION VISIT  Today's visit was # 8 out of 22.  Starting weight: 267 lbs Starting date: 09/08/17 Today's weight : 266 lbs Today's date: 02/04/2018 Total lbs lost to date: 1 (Patients must lose 7 lbs in the first 6 months to continue with counseling)   ASK: We discussed the diagnosis of obesity with Cory Little today and Uno agreed to give [name] permission to discuss obesity behavioral modification therapy today.  ASSESS: Cory Little has the diagnosis of obesity and his BMI today is 37.12 Cory Little is in the action stage of change   ADVISE: Cory Little was educated on the multiple health risks of obesity as well as the benefit of weight loss to improve his health. He was advised of the need for long term treatment and the importance of lifestyle modifications.  AGREE: Multiple dietary modification options and treatment options were discussed and  Cory Little agreed to the above obesity treatment plan.  Cory Little, Cory Little, am acting as transcriptionist for Quillian Quince,  MD  Cory Little have reviewed the above documentation for accuracy and completeness, and Cory Little agree with the above. -Quillian Quince, MD

## 2018-02-05 LAB — LIPID PANEL WITH LDL/HDL RATIO
Cholesterol, Total: 203 mg/dL — ABNORMAL HIGH (ref 100–199)
HDL: 33 mg/dL — ABNORMAL LOW (ref >39)
LDL Calculated: 126 mg/dL — ABNORMAL HIGH (ref 0–99)
LDl/HDL Ratio: 3.8 ratio — ABNORMAL HIGH (ref 0.0–3.6)
Triglycerides: 222 mg/dL — ABNORMAL HIGH (ref 0–149)
VLDL Cholesterol Cal: 44 mg/dL — ABNORMAL HIGH (ref 5–40)

## 2018-02-05 LAB — HEMOGLOBIN A1C
Est. average glucose Bld gHb Est-mCnc: 105 mg/dL
Hgb A1c MFr Bld: 5.3 % (ref 4.8–5.6)

## 2018-02-05 LAB — VITAMIN D 25 HYDROXY (VIT D DEFICIENCY, FRACTURES): Vit D, 25-Hydroxy: 20.3 ng/mL — ABNORMAL LOW (ref 30.0–100.0)

## 2018-02-05 LAB — INSULIN, RANDOM: INSULIN: 14.1 u[IU]/mL (ref 2.6–24.9)

## 2018-02-10 ENCOUNTER — Other Ambulatory Visit: Payer: Self-pay | Admitting: Internal Medicine

## 2018-02-10 DIAGNOSIS — F900 Attention-deficit hyperactivity disorder, predominantly inattentive type: Secondary | ICD-10-CM

## 2018-02-10 NOTE — Telephone Encounter (Signed)
Copied from CRM [id number]. Topic: Quick Communication - Rx Refill/Question >> Feb 10, 2018  3:09 PM Henry Russelawoud, Jessica L wrote: Medication: amphetamine-dextroamphetamine (ADDERALL XR) 20 MG 24 hr capsule   Has the patient contacted their pharmacy? Yes [facility] [address]   Agent: Please be advised that RX refills may take up to 3 business days. We ask that you follow-up with your pharmacy.

## 2018-02-11 MED ORDER — AMPHETAMINE-DEXTROAMPHET ER 20 MG PO CP24: 20.0000 mg | ORAL_CAPSULE | Freq: Every day | ORAL | 0 refills | Status: DC

## 2018-02-11 NOTE — Telephone Encounter (Signed)
Check [place] registry last filled 01/06/2018.[name].Raechel Chute/lmb

## 2018-02-11 NOTE — Telephone Encounter (Signed)
Refill of adderall  LRF  01/06/18  #30  0 refills  LOV 01/06/18 Dr. [facility]reJones  [facility] [address]

## 2018-02-28 ENCOUNTER — Ambulatory Visit (HOSPITAL_COMMUNITY)
Admission: EM | Admit: 2018-02-28 | Discharge: 2018-02-28 | Disposition: A | Discharge status: Home / Self Care | Payer: BLUE CROSS/BLUE SHIELD | Attending: Family Medicine | Admitting: Family Medicine

## 2018-02-28 ENCOUNTER — Ambulatory Visit (INDEPENDENT_AMBULATORY_CARE_PROVIDER_SITE_OTHER): Payer: BLUE CROSS/BLUE SHIELD

## 2018-02-28 ENCOUNTER — Encounter (HOSPITAL_COMMUNITY): Payer: Self-pay | Admitting: Emergency Medicine

## 2018-02-28 DIAGNOSIS — J189 Pneumonia, unspecified organism: Secondary | ICD-10-CM

## 2018-02-28 DIAGNOSIS — R05 Cough: Secondary | ICD-10-CM | POA: Diagnosis not present

## 2018-02-28 MED ORDER — GUAIFENESIN-CODEINE 100-10 MG/5ML PO SYRP: 5.0000 mL | ORAL_SOLUTION | Freq: Every day | ORAL | 0 refills | Status: DC

## 2018-02-28 MED ORDER — ALBUTEROL SULFATE HFA 108 (90 BASE) MCG/ACT IN AERS
2.0000 | INHALATION_SPRAY | Freq: Once | RESPIRATORY_TRACT | Status: AC
  Administered 2018-02-28: 2 via RESPIRATORY_TRACT

## 2018-02-28 MED ORDER — ALBUTEROL SULFATE HFA 108 (90 BASE) MCG/ACT IN AERS
INHALATION_SPRAY | RESPIRATORY_TRACT | Status: AC
  Filled 2018-02-28: qty 6.7

## 2018-02-28 MED ORDER — AZITHROMYCIN 250 MG PO TABS: 250.0000 mg | ORAL_TABLET | Freq: Every day | ORAL | 0 refills | Status: DC

## 2018-02-28 MED ORDER — BENZONATATE 100 MG PO CAPS: 100.0000 mg | ORAL_CAPSULE | Freq: Three times a day (TID) | ORAL | 0 refills | Status: DC

## 2018-02-28 NOTE — Discharge Instructions (Addendum)
X-rays showed a pneumonia Inhaler given in office Get plenty of rest and push fluids Use OTC medications as needed for symptomatic relief of fever and body aches Tessalon perles to use during the day for cough Robitussin AC prescribed to use at bedtime for cough Azithromycin prescribed.  Take as prescribed and to completion.   Take antibiotic as directed and to completion Follow up with PCP next week for reevaluation Return or go to ER if you have any new or worsening symptoms   Recommend repeat x-ray in 6 weeks to exclude underlying malignancy

## 2018-02-28 NOTE — ED Triage Notes (Signed)
Pt c/o hx of walking pneumonia, pt c/o cough, sweating. x5.

## 2018-02-28 NOTE — ED Provider Notes (Signed)
[facility]   [id number] 02/28/18 Arrival Time: 1004  SUBJECTIVE:  Cory Little is a 32 y.o. male who presents with stable cough for the past 5 days.  Admits to positive sick exposure to walking pneumonia.  Describes cough as constant and dry.  Has tried OTC medications without relief.  Denies aggravating factors.  Reports previous symptoms in the past and diagnosed with PNA.  Complains of associated HA, SOB, wheezing, and sweating.  Denies fever, chills, fatigue, sinus pain, rhinorrhea, sore throat, chest pain, nausea, changes in bowel or bladder habits.    ROS: As per HPI.  Past Medical History:  Diagnosis Date  . ADD (attention deficit disorder)   . Chest pain   . Chest tightness   . Depression   . GERD (gastroesophageal reflux disease)   . Hypertension   . Intolerance to cold   . Kidney stone 2004  . Sleep apnea    Past Surgical History:  Procedure Laterality Date  . facial liposuction    . WISDOM TOOTH EXTRACTION  2016   Allergies  Allergen Reactions  . Wellbutrin [Bupropion] Other (See Comments)    tremors   No current facility-administered medications on file prior to encounter.    Current Outpatient Medications on File Prior to Encounter  Medication Sig Dispense Refill  . amphetamine-dextroamphetamine (ADDERALL XR) 20 MG 24 hr capsule Take 1 capsule (20 mg total) by mouth daily. 90 capsule 0  . ASPIRIN PO Take by mouth.    . COD LIVER OIL PO Take by mouth.    . Coenzyme Q10 (COQ10 PO) Take by mouth.    . [facility] MELATONIN PO Take by mouth.    . Ginkgo Biloba (GINKOBA PO) Take by mouth.    . LYSINE PO Take by mouth.    [name] MAGNESIUM PO Take by mouth.    . metFORMIN (GLUCOPHAGE) 500 MG tablet Take 1 tablet (500 mg total) by mouth daily with breakfast. 30 tablet 0  . Multiple Vitamins-Minerals (CENTRUM ADULTS PO) Take by mouth.    . telmisartan-hydrochlorothiazide (MICARDIS HCT) 80-25 MG tablet Take 1 tablet by mouth daily. 30 tablet 1  . Vitamin D,  Ergocalciferol, (DRISDOL) 50000 units CAPS capsule Take 1 capsule (50,000 Units total) by mouth every 7 (seven) days. 4 capsule 0  . vortioxetine HBr (TRINTELLIX) 5 MG TABS tablet Take 1 tablet (5 mg total) by mouth daily. 28 tablet 0    Social History   Socioeconomic History  . Marital status: Single    Spouse name: Not on file  . Number of children: Not on file  . Years of education: Not on file  . Highest education level: Not on file  Occupational History  . Not on file  Social Needs  . Financial resource strain: Not on file  . Food insecurity:    Worry: Not on file    Inability: Not on file  . Transportation needs:    Medical: Not on file    Non-medical: Not on file  Tobacco Use  . Smoking status: Former Smoker    Types: Cigarettes  . Smokeless tobacco: Never Used  Substance and Sexual Activity  . Alcohol use: No    Comment: socially  . Drug use: Yes    Types: Marijuana  . Sexual activity: Yes    Birth control/protection: Condom  Lifestyle  . Physical activity:    Days per week: Not on file    Minutes per session: Not on file  . Stress: Not on  file  Relationships  . Social connections:    Talks on phone: Not on file    Gets together: Not on file    Attends religious service: Not on file    Active member of club or organization: Not on file    Attends meetings of clubs or organizations: Not on file    Relationship status: Not on file  . Intimate partner violence:    Fear of current or ex partner: Not on file    Emotionally abused: Not on file    Physically abused: Not on file    Forced sexual activity: Not on file  Other Topics Concern  . Not on file  Social History Narrative  . Not on file   Family History  Problem Relation Age of Onset  . Sleep apnea Father   . Depression Mother      OBJECTIVE:  Vitals:   02/28/18 1029  BP: (!) 148/88  Pulse: 89  Resp: 16  Temp: 97.9 F (36.6 C)  TempSrc: Oral  SpO2: 96%     General appearance: AOx3 in  no acute distress; nontoxic appearance HEENT: EACs clear, TMs pearly gray; PERRL.  EOM grossly intact.  Sinuses nontender; no clear rhinorrhea; tonsils nonerythematous, uvula midline Neck: supple without LAD Lungs: clear to auscultation bilaterally without adventitious breath sounds; persistent dry cough Heart: regular rate and rhythm.  Radial pulses 2+ symmetrical bilaterally Skin: warm and dry Psychological: alert and cooperative; normal mood and affect   DIAGNOSTIC STUDIES:   CLINICAL DATA: Nonproductive cough for 5 days. Recent pneumonia.  EXAM: CHEST - 2 VIEW  COMPARISON: 01/08/2018  FINDINGS: The heart size and mediastinal contours are within normal limits. New patchy opacity is seen in both mid lungs which is new since previous study, and suspicious for pneumonia or other inflammatory process. No evidence of pleural effusion.  IMPRESSION: New patchy pulmonary opacity in both mid lungs, suspicious for pneumonia or other inflammatory process. Recommend clinical correlation, and followup PA and lateral chest X-ray in several weeks to ensure resolution.   Electronically Signed By: Myles RosenthalJohn Stahl M.D. On: 02/28/2018 11:13  I have reviewed the x-rays myself and agree with the radiologists interpretation.     ASSESSMENT & PLAN:  1. Pneumonia of both lungs due to infectious organism, unspecified part of lung     Meds ordered this encounter  Medications  . albuterol (PROVENTIL HFA;VENTOLIN HFA) 108 (90 Base) MCG/ACT inhaler 2 puff   X-rays showed a pneumonia Inhaler given in office Get plenty of rest and push fluids Use OTC medications as needed for symptomatic relief of fever and body aches Tessalon perles to use during the day for cough Robitussin AC prescribed to use at bedtime for cough Azithromycin prescribed.  Take as prescribed and to completion.   Take antibiotic as directed and to completion Follow up with PCP next week for reevaluation Return or go  to ER if you have any new or worsening symptoms   Recommend repeat x-ray in 6 weeks to exclude underlying malignancy  Reviewed expectations re: course of current medical issues. Questions answered. Outlined signs and symptoms indicating need for more acute intervention. Patient verbalized understanding. After Visit Summary given.          Rennis HardingWurst, Aleysia Oltmann, PA-C 02/28/18 1146

## 2018-02-28 NOTE — ED Notes (Signed)
Pt discharged by provider.

## 2018-03-18 ENCOUNTER — Encounter (INDEPENDENT_AMBULATORY_CARE_PROVIDER_SITE_OTHER): Payer: Self-pay

## 2018-03-18 ENCOUNTER — Ambulatory Visit (INDEPENDENT_AMBULATORY_CARE_PROVIDER_SITE_OTHER): Payer: BLUE CROSS/BLUE SHIELD | Admitting: Family Medicine

## 2018-04-10 ENCOUNTER — Other Ambulatory Visit: Payer: Self-pay | Admitting: Internal Medicine

## 2018-04-10 DIAGNOSIS — F900 Attention-deficit hyperactivity disorder, predominantly inattentive type: Secondary | ICD-10-CM

## 2018-04-10 DIAGNOSIS — F321 Major depressive disorder, single episode, moderate: Secondary | ICD-10-CM

## 2018-04-10 NOTE — Telephone Encounter (Signed)
Copied from CRM [id number]. Topic: Quick Communication - Rx Refill/Question >> Apr 10, 2018  2:39 PM Angela NevinWilliams, Candice N wrote: Medication: vortioxetine HBr (TRINTELLIX) 5 MG TABS tablet [[id number]][[id number]] and amphetamine-dextroamphetamine (ADDERALL XR) 20 MG 24 hr capsule [[id number]][[id number]]     Preferred Pharmacy (with phone number or street name):[id number] [place] [place] - [address]

## 2018-04-10 NOTE — Telephone Encounter (Signed)
Vortioxetine HBr (rintellix) 5mg  tab refill Last Refill:01/06/18 #28 Last OV: 01/06/18  Amphetamine-dextroamphetamine (Adderall) refill Last Refill:02/11/18 #90 Last OV: 01/06/18 PCP: Dr. Yetta BarreJones [facility] [address]

## 2018-04-13 NOTE — Telephone Encounter (Signed)
Pt was due for follow up on 04/08/2018

## 2018-04-14 NOTE — Telephone Encounter (Signed)
Left patient vm to call back to schedule med follow up.

## 2018-04-16 ENCOUNTER — Encounter: Payer: Self-pay | Admitting: Internal Medicine

## 2018-04-16 ENCOUNTER — Ambulatory Visit (INDEPENDENT_AMBULATORY_CARE_PROVIDER_SITE_OTHER): Payer: BLUE CROSS/BLUE SHIELD | Admitting: Internal Medicine

## 2018-04-16 ENCOUNTER — Telehealth: Payer: Self-pay | Admitting: Internal Medicine

## 2018-04-16 DIAGNOSIS — Z23 Encounter for immunization: Secondary | ICD-10-CM

## 2018-04-16 DIAGNOSIS — F331 Major depressive disorder, recurrent, moderate: Secondary | ICD-10-CM | POA: Diagnosis not present

## 2018-04-16 DIAGNOSIS — F900 Attention-deficit hyperactivity disorder, predominantly inattentive type: Secondary | ICD-10-CM

## 2018-04-16 DIAGNOSIS — I10 Essential (primary) hypertension: Secondary | ICD-10-CM

## 2018-04-16 MED ORDER — TELMISARTAN-HCTZ 80-25 MG PO TABS: 1.0000 | ORAL_TABLET | Freq: Every day | ORAL | 1 refills | Status: DC

## 2018-04-16 MED ORDER — VORTIOXETINE HBR 20 MG PO TABS
20.0000 mg | ORAL_TABLET | Freq: Every day | ORAL | 1 refills | Status: DC
Start: 2018-04-16 — End: 2018-12-17

## 2018-04-16 MED ORDER — AMPHETAMINE-DEXTROAMPHET ER 20 MG PO CP24: 20.0000 mg | ORAL_CAPSULE | Freq: Every day | ORAL | 0 refills | Status: DC

## 2018-04-16 NOTE — Progress Notes (Signed)
Subjective:  Patient ID: Cory Little, male    DOB: [birth year]  Age: 32 y.o. MRN: [id number]  CC: Hypertension   HPI NORMAL RECINOS presents for f/up -he tells me his blood pressure has been well controlled.  He denies any recent episodes of headache, blurred vision, chest pain, or shortness of breath.  Pt states ADD status overall stable on current meds with overall good compliance and tolerability, and good effectiveness with respect to ability for concentration and task completion.  He is doing well on Trintellix and wants to increase to the 20 mg a day dose.  Outpatient Medications Prior to Visit  Medication Sig Dispense Refill  . metFORMIN (GLUCOPHAGE) 500 MG tablet Take 1 tablet (500 mg total) by mouth daily with breakfast. 30 tablet 0  . Multiple Vitamins-Minerals (CENTRUM ADULTS PO) Take by mouth.    [name] amphetamine-dextroamphetamine (ADDERALL XR) 20 MG 24 hr capsule Take 1 capsule (20 mg total) by mouth daily. 90 capsule 0  . ASPIRIN PO Take by mouth.    . telmisartan-hydrochlorothiazide (MICARDIS HCT) 80-25 MG tablet Take 1 tablet by mouth daily. 30 tablet 1  . vortioxetine HBr (TRINTELLIX) 5 MG TABS tablet Take 1 tablet (5 mg total) by mouth daily. 28 tablet 0  . azithromycin (ZITHROMAX) 250 MG tablet Take 1 tablet (250 mg total) by mouth daily. Take first 2 tablets together, then 1 every day until finished. 6 tablet 0  . benzonatate (TESSALON) 100 MG capsule Take 1 capsule (100 mg total) by mouth every 8 (eight) hours. 21 capsule 0  . COD LIVER OIL PO Take by mouth.    . Coenzyme Q10 (COQ10 PO) Take by mouth.    . [facility]S MELATONIN PO Take by mouth.    . Ginkgo Biloba (GINKOBA PO) Take by mouth.    [name] guaiFENesin-codeine (ROBITUSSIN AC) 100-10 MG/5ML syrup Take 5 mLs by mouth at bedtime. 60 mL 0  . LYSINE PO Take by mouth.    [name] MAGNESIUM PO Take by mouth.    . Vitamin D, Ergocalciferol, (DRISDOL) 50000 units CAPS capsule Take 1 capsule (50,000 Units total) by mouth every 7  (seven) days. 4 capsule 0   No facility-administered medications prior to visit.     ROS Review of Systems  Constitutional: Negative for diaphoresis and fatigue.  HENT: Negative.   Eyes: Negative for visual disturbance.  Respiratory: Negative for cough, chest tightness, shortness of breath and wheezing.   Cardiovascular: Negative for chest pain, palpitations and leg swelling.  Gastrointestinal: Negative for abdominal pain, diarrhea, nausea and vomiting.  Endocrine: Negative.   Genitourinary: Negative.  Negative for difficulty urinating.  Musculoskeletal: Negative.  Negative for arthralgias, back pain and myalgias.  Skin: Negative.  Negative for color change.  Neurological: Negative.  Negative for dizziness, weakness and light-headedness.  Hematological: Negative for adenopathy. Does not bruise/bleed easily.  Psychiatric/Behavioral: Positive for decreased concentration. Negative for behavioral problems, dysphoric mood, sleep disturbance and suicidal ideas. The patient is not nervous/anxious.     Objective:  There were no vitals taken for this visit.  BP Readings from Last 3 Encounters:  02/28/18 (!) 148/88  02/04/18 118/77  01/06/18 118/88    Wt Readings from Last 3 Encounters:  02/04/18 266 lb (120.7 kg)  01/06/18 278 lb (126.1 kg)  01/07/18 275 lb 9.6 oz (125 kg)    Physical Exam  Constitutional: He is oriented to person, place, and time. No distress.  HENT:  Mouth/Throat: Oropharynx is clear and moist. No oropharyngeal  exudate.  Eyes: Conjunctivae are normal. No scleral icterus.  Neck: Normal range of motion. Neck supple. No JVD present. No thyromegaly present.  Cardiovascular: Normal rate and normal heart sounds. Exam reveals no gallop and no friction rub.  No murmur heard. Pulmonary/Chest: Effort normal and breath sounds normal. No respiratory distress. He has no wheezes. He has no rales.  Abdominal: Soft. Normal appearance and bowel sounds are normal. He exhibits no  mass. There is no hepatosplenomegaly. There is no tenderness.  Musculoskeletal: Normal range of motion. He exhibits no edema, tenderness or deformity.  Lymphadenopathy:    He has no cervical adenopathy.  Neurological: He is alert and oriented to person, place, and time.  Skin: Skin is warm and dry. No rash noted. He is not diaphoretic.  Psychiatric: He has a normal mood and affect. His behavior is normal. Thought content normal.  Vitals reviewed.   Lab Results  Component Value Date   WBC 6.6 09/08/2017   HGB 15.6 09/08/2017   HCT 44.8 09/08/2017   PLT 230.0 04/16/2017   GLUCOSE 90 01/06/2018   CHOL 203 (H) 02/04/2018   TRIG 222 (H) 02/04/2018   HDL 33 (L) 02/04/2018   LDLDIRECT 155.0 04/16/2017   LDLCALC 126 (H) 02/04/2018   ALT 40 09/08/2017   AST 22 09/08/2017   NA 139 01/06/2018   K 3.8 01/06/2018   CL 105 01/06/2018   CREATININE 1.06 01/06/2018   BUN 16 01/06/2018   CO2 27 01/06/2018   TSH 0.661 09/08/2017   HGBA1C 5.3 02/04/2018    Dg Chest 2 View  Result Date: 02/28/2018 CLINICAL DATA:  Nonproductive cough for 5 days.  Recent pneumonia. EXAM: CHEST - 2 VIEW COMPARISON:  01/08/2018 FINDINGS: The heart size and mediastinal contours are within normal limits. New patchy opacity is seen in both mid lungs which is new since previous study, and suspicious for pneumonia or other inflammatory process. No evidence of pleural effusion. IMPRESSION: New patchy pulmonary opacity in both mid lungs, suspicious for pneumonia or other inflammatory process. Recommend clinical correlation, and followup PA and lateral chest X-ray in several weeks to ensure resolution. Electronically Signed   By: Myles RosenthalJohn  Stahl M.D.   On: 02/28/2018 11:13    Assessment & Plan:   Beulah GandyRudolph was seen today for hypertension.  Diagnoses and all orders for this visit:  Need for influenza vaccination -     Flu Vaccine QUAD 36+ mos IM  Essential hypertension- His blood pressure is well controlled on the current  dose of an ARB and thiazide diuretic.  Will continue. -     telmisartan-hydrochlorothiazide (MICARDIS HCT) 80-25 MG tablet; Take 1 tablet by mouth daily.  Attention deficit hyperactivity disorder (ADHD), predominantly inattentive type- He is doing well on the current dose of Adderall.  Will continue. -     Discontinue: amphetamine-dextroamphetamine (ADDERALL XR) 20 MG 24 hr capsule; Take 1 capsule (20 mg total) by mouth daily.  Moderate episode of recurrent major depressive disorder (HCC) -     vortioxetine HBr (TRINTELLIX) 20 MG TABS tablet; Take 1 tablet (20 mg total) by mouth daily.   I have discontinued Beulah Gandyudolph L. Zettlemoyer's Coenzyme Q10 (COQ10 PO), LYSINE PO, CVS MELATONIN PO, COD LIVER OIL PO, Ginkgo Biloba (GINKOBA PO), ASPIRIN PO, MAGNESIUM PO, Vitamin D (Ergocalciferol), vortioxetine HBr, amphetamine-dextroamphetamine, azithromycin, guaiFENesin-codeine, and benzonatate. I am also having him start on vortioxetine HBr. Additionally, I am having him maintain his Multiple Vitamins-Minerals (CENTRUM ADULTS PO), metFORMIN, and telmisartan-hydrochlorothiazide.  Meds ordered this encounter  Medications  . telmisartan-hydrochlorothiazide (MICARDIS HCT) 80-25 MG tablet    Sig: Take 1 tablet by mouth daily.    Dispense:  30 tablet    Refill:  1  . vortioxetine HBr (TRINTELLIX) 20 MG TABS tablet    Sig: Take 1 tablet (20 mg total) by mouth daily.    Dispense:  90 tablet    Refill:  1  . DISCONTD: amphetamine-dextroamphetamine (ADDERALL XR) 20 MG 24 hr capsule    Sig: Take 1 capsule (20 mg total) by mouth daily.    Dispense:  90 capsule    Refill:  0     Follow-up: Return in about 6 months (around 10/15/2018).  Sanda Linger, MD

## 2018-04-16 NOTE — Patient Instructions (Signed)

## 2018-04-16 NOTE — Telephone Encounter (Addendum)
Pt is requesting to change to tablets of the Adderall 20 mg (non XR) instead of the capsules.   Please advise. Pt will bring original script to us if you approve. [facility] is [facility] [address] [place]

## 2018-04-16 NOTE — Telephone Encounter (Signed)
Copied from CRM [phone]#[id number]. Topic: Quick Communication - Rx Refill/Question >> Apr 16, 2018 11:56 AM Baldo DaubAlexander, Amber L wrote: Medication:  amphetamine-dextroamphetamine (ADDERALL XR) 20 MG 24 hr capsule  Pt states that he was given a script for this today and it is going to be too expensive.  Pt wants to know if he can get a script for the non-XR medication.  Pt states this will save him over $100.  Pt states that he will bring back the XR script if necessary to get the other done.  Pt can be reached at [phone]

## 2018-04-17 MED ORDER — AMPHETAMINE-DEXTROAMPHETAMINE 20 MG PO TABS: 20.0000 mg | ORAL_TABLET | Freq: Every day | ORAL | 0 refills | Status: DC

## 2018-04-17 NOTE — Telephone Encounter (Signed)
Ok Thx 

## 2018-04-17 NOTE — Telephone Encounter (Signed)
Cory Little sent in rx change to tablets instead of capsules/

## 2018-07-21 ENCOUNTER — Telehealth: Payer: Self-pay | Admitting: Internal Medicine

## 2018-07-21 NOTE — Telephone Encounter (Signed)
Copied from CRM [id number]. Topic: General - Other >> Jul 21, 2018  1:55 PM Leafy Roobinson, Norma J wrote: Reason for CRM: pt needs a refill on generic adderall 20 mg . [facility] [place] [facility] [address]. Pt would like medication before friday

## 2018-07-23 MED ORDER — AMPHETAMINE-DEXTROAMPHETAMINE 20 MG PO TABS: 20.0000 mg | ORAL_TABLET | Freq: Every day | ORAL | 0 refills | Status: DC

## 2018-07-23 NOTE — Telephone Encounter (Signed)
Spoke with patient and info given. He has been informed that he will need to set up an OV with Dr. Yetta BarreJones for further refills.

## 2018-07-23 NOTE — Telephone Encounter (Signed)
Will send in one month; he is due for follow-up with Dr. Yetta BarreJones to continue getting the medication however; needs to schedule an OV.

## 2018-09-07 ENCOUNTER — Telehealth: Payer: Self-pay | Admitting: Internal Medicine

## 2018-09-07 NOTE — Telephone Encounter (Signed)
Pt is due for an appointment in March. Can you have him schedule?

## 2018-09-07 NOTE — Telephone Encounter (Signed)
Copied from CRM [phone]. Topic: Quick Communication - Rx Refill/Question >> Sep 07, 2018  4:41 PM Arlyss Gandyichardson, Charletha Dalpe N, NT wrote: Medication: amphetamine-dextroamphetamine (ADDERALL) 20 MG tablet  Pt states that [place] in [place] and cannot come in for an appt until May and would like to see what he can do in order to have a refill. [place]   Has the patient contacted their pharmacy? Yes.   (Agent: If no, request that the patient contact the pharmacy for the refill.) (Agent: If yes, when and what did the pharmacy advise?)  Preferred Pharmacy (with phone number or street name): [id number] [place] [address] [facility] [phone] (Phone) [phone] (Fax)    Agent: Please be advised that RX refills may take up to 3 business days. We ask that you follow-up with your pharmacy.

## 2018-09-08 NOTE — Telephone Encounter (Signed)
Patient states that he will not be able to come in until May because he is in [place] for school. He said that he will call back to schedule when he knows when he will be back home.

## 2018-09-08 NOTE — Telephone Encounter (Signed)
Pt requested to schedule follow up appt.  Pt not able to be seen until May.   Okay to refill Adderall?

## 2018-09-09 ENCOUNTER — Other Ambulatory Visit: Payer: Self-pay | Admitting: Internal Medicine

## 2018-09-09 ENCOUNTER — Telehealth: Payer: Self-pay | Admitting: Internal Medicine

## 2018-09-09 DIAGNOSIS — F900 Attention-deficit hyperactivity disorder, predominantly inattentive type: Secondary | ICD-10-CM

## 2018-09-09 MED ORDER — AMPHETAMINE-DEXTROAMPHETAMINE 20 MG PO TABS: 20.0000 mg | ORAL_TABLET | Freq: Every day | ORAL | 0 refills | Status: DC

## 2018-09-09 NOTE — Telephone Encounter (Signed)
Copied from CRM [id number]. Topic: Quick Communication - See Telephone Encounter >> Sep 09, 2018  3:11 PM Windy Kalata, NT wrote: CRM for notification. See Telephone encounter for: 09/09/18.  Patient is calling and states that his insurance he has will only cover 30 day supply of amphetamine-dextroamphetamine (ADDERALL) 20 MG tablet. A 90 day supply was sent in. So he is needing this fixed to 30 days each. Please advise.  [id number] [place] - [address] AT [facility] [address] [place] [address] Phone: [phone] Fax: [phone]

## 2018-09-09 NOTE — Telephone Encounter (Signed)
RX sent

## 2018-09-11 ENCOUNTER — Other Ambulatory Visit: Payer: Self-pay | Admitting: Internal Medicine

## 2018-09-11 DIAGNOSIS — F900 Attention-deficit hyperactivity disorder, predominantly inattentive type: Secondary | ICD-10-CM

## 2018-09-11 MED ORDER — AMPHETAMINE-DEXTROAMPHETAMINE 20 MG PO TABS: 20.0000 mg | ORAL_TABLET | Freq: Every day | ORAL | 0 refills | Status: DC

## 2018-09-11 NOTE — Telephone Encounter (Signed)
Can you res[facility] a [facility] [place]. They will not partially fill rx.

## 2018-11-03 ENCOUNTER — Other Ambulatory Visit: Payer: Self-pay | Admitting: Internal Medicine

## 2018-11-03 DIAGNOSIS — F900 Attention-deficit hyperactivity disorder, predominantly inattentive type: Secondary | ICD-10-CM

## 2018-11-03 NOTE — Telephone Encounter (Signed)
Requested medication (s) are due for refill today: yes  Requested medication (s) are on the active medication list: yes  Last refill:  09/11/18 #30  Future visit scheduled: No  Notes to clinic:  Unable to refill per protocol    Requested Prescriptions  Pending Prescriptions Disp Refills   amphetamine-dextroamphetamine (ADDERALL) 20 MG tablet 30 tablet 0    Sig: Take 1 tablet (20 mg total) by mouth daily.     Not Delegated - Psychiatry:  Stimulants/ADHD Failed - 11/03/2018  2:08 PM      Failed - This refill cannot be delegated      Failed - Urine Drug Screen completed in last 360 days.      Failed - Valid encounter within last 3 months    Recent Outpatient Visits          6 months ago Need for influenza vaccination   [facility] -Madelin Rear, [facility]   10 months ago Attention deficit hyperactivity disorder (ADHD), predominantly inattentive type   [facility] -Madelin Rear, [facility]   1 year ago Essential hypertension   [facility] -Madelin Rear, [facility]   1 year ago Encounter for monitoring stimulant therapy   [facility] -Madelin Rear, [facility]   1 year ago Routine general medical examination at a health care facility   [facility] -Madelin Rear, [place]

## 2018-11-04 ENCOUNTER — Other Ambulatory Visit: Payer: Self-pay | Admitting: Internal Medicine

## 2018-11-04 DIAGNOSIS — F900 Attention-deficit hyperactivity disorder, predominantly inattentive type: Secondary | ICD-10-CM

## 2018-11-04 MED ORDER — AMPHETAMINE-DEXTROAMPHETAMINE 20 MG PO TABS: 20.0000 mg | ORAL_TABLET | Freq: Every day | ORAL | 0 refills | Status: DC

## 2018-11-04 NOTE — Telephone Encounter (Signed)
Per Database, Adderall last filled on 07/23/2018 LOV with PCP was 04/16/2018

## 2018-11-09 ENCOUNTER — Emergency Department
Admission: EM | Admit: 2018-11-09 | Discharge: 2018-11-09 | Disposition: A | Discharge status: Home / Self Care | Payer: BC Managed Care – PPO | Attending: Emergency Medicine | Admitting: Emergency Medicine

## 2018-11-09 DIAGNOSIS — Y92410 Unspecified street and highway as the place of occurrence of the external cause: Secondary | ICD-10-CM | POA: Insufficient documentation

## 2018-11-09 DIAGNOSIS — I10 Essential (primary) hypertension: Secondary | ICD-10-CM | POA: Insufficient documentation

## 2018-11-09 DIAGNOSIS — M62830 Muscle spasm of back: Secondary | ICD-10-CM | POA: Insufficient documentation

## 2018-11-09 DIAGNOSIS — M62838 Other muscle spasm: Secondary | ICD-10-CM | POA: Insufficient documentation

## 2018-11-09 MED ORDER — DIAZEPAM 5 MG PO TABS
5.00 mg | ORAL_TABLET | Freq: Four times a day (QID) | ORAL | 0 refills | Status: AC | PRN
Start: 2018-11-09 — End: ?

## 2018-11-09 MED ORDER — DIAZEPAM 5 MG PO TABS
5.00 mg | ORAL_TABLET | Freq: Once | ORAL | Status: AC
Start: 2018-11-09 — End: 2018-11-09
  Administered 2018-11-09: 19:00:00 5 mg via ORAL
  Filled 2018-11-09: qty 1
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
## 2018-12-02 ENCOUNTER — Encounter: Payer: Self-pay | Admitting: Internal Medicine

## 2018-12-02 ENCOUNTER — Ambulatory Visit: Payer: BLUE CROSS/BLUE SHIELD | Admitting: Internal Medicine

## 2018-12-02 DIAGNOSIS — I10 Essential (primary) hypertension: Secondary | ICD-10-CM

## 2018-12-02 NOTE — Progress Notes (Signed)
Virtual Visit via Video Note  I connected with Cory Little on 12/02/18 at  3:00 PM EDT by a video enabled telemedicine application and verified that I am speaking with the correct person using two identifiers.   I discussed the limitations of evaluation and management by telemedicine and the availability of in person appointments. The patient expressed understanding and agreed to proceed.  History of Present Illness:  He did not login for a virtual visit today   Observations/Objective:   Assessment and Plan:   Follow Up Instructions:    I discussed the assessment and treatment plan with the patient. The patient was provided an opportunity to ask questions and all were answered. The patient agreed with the plan and demonstrated an understanding of the instructions.   The patient was advised to call back or seek an in-person evaluation if the symptoms worsen or if the condition fails to improve as anticipated.  I provided 0 minutes of non-face-to-face time during this encounter.   Sanda Linger, MD

## 2018-12-03 ENCOUNTER — Other Ambulatory Visit: Payer: Self-pay

## 2018-12-03 ENCOUNTER — Ambulatory Visit: Payer: Self-pay | Admitting: Internal Medicine

## 2018-12-03 ENCOUNTER — Ambulatory Visit (INDEPENDENT_AMBULATORY_CARE_PROVIDER_SITE_OTHER): Payer: BLUE CROSS/BLUE SHIELD | Admitting: Internal Medicine

## 2018-12-03 DIAGNOSIS — G4733 Obstructive sleep apnea (adult) (pediatric): Secondary | ICD-10-CM | POA: Diagnosis not present

## 2018-12-03 DIAGNOSIS — F900 Attention-deficit hyperactivity disorder, predominantly inattentive type: Secondary | ICD-10-CM

## 2018-12-03 DIAGNOSIS — Z9989 Dependence on other enabling machines and devices: Secondary | ICD-10-CM

## 2018-12-03 MED ORDER — AMPHETAMINE-DEXTROAMPHETAMINE 20 MG PO TABS: 20.0000 mg | ORAL_TABLET | Freq: Every day | ORAL | 0 refills | Status: DC

## 2018-12-03 NOTE — Progress Notes (Signed)
Virtual Visit via Video Note  I connected with Cory Little on 12/03/18 at  1:30 PM EDT by a video enabled telemedicine application and verified that I am speaking with the correct person using two identifiers.   I discussed the limitations of evaluation and management by telemedicine and the availability of in person appointments. The patient expressed understanding and agreed to proceed.  History of Present Illness: He checked in for a virtual visit.  He was not willing to come in due to the COVID-19 pandemic.  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  He continues to struggle with focus and productivity.  He is doing well on the current dose of Adderall and wants to continue taking it.  Denies insomnia, loss of appetite, weight loss, anxiety, jitteriness, anger management issues, or difficulty with his relationships.  He has not been exercising much and is afraid he may be gaining weight.  He has a history of sleep apnea and wants me to refer him to an ENT doctor to see if there is a surgical procedure to treat the sleep apnea.  He denies chest pain, shortness of breath, or diaphoresis.    Observations/Objective: Healthy 33 year old male in no distress.  His mood and affect were normal.  He was calm, cooperative, and appropriate.  His speech and respirations were normal.  Lab Results  Component Value Date   WBC 6.6 09/08/2017   HGB 15.6 09/08/2017   HCT 44.8 09/08/2017   PLT 230.0 04/16/2017   GLUCOSE 90 01/06/2018   CHOL 203 (H) 02/04/2018   TRIG 222 (H) 02/04/2018   HDL 33 (L) 02/04/2018   LDLDIRECT 155.0 04/16/2017   LDLCALC 126 (H) 02/04/2018   ALT 40 09/08/2017   AST 22 09/08/2017   NA 139 01/06/2018   K 3.8 01/06/2018   CL 105 01/06/2018   CREATININE 1.06 01/06/2018   BUN 16 01/06/2018   CO2 27 01/06/2018   TSH 0.661 09/08/2017   HGBA1C 5.3 02/04/2018     Assessment and Plan: He is doing well on the current dose of Adderall so I will  continue the current dose.  I ordered a referral for him to see an ENT doctor to see if there is a surgical procedure to treat the sleep apnea.   Follow Up Instructions: He will let me know if he develops any new or worsening symptoms.  He agrees to take the Adderall as directed.  I have encouraged him to be physically active during this time of a pandemic.    I discussed the assessment and treatment plan with the patient. The patient was provided an opportunity to ask questions and all were answered. The patient agreed with the plan and demonstrated an understanding of the instructions.   The patient was advised to call back or seek an in-person evaluation if the symptoms worsen or if the condition fails to improve as anticipated.  I provided 15 minutes of non-face-to-face time during this encounter.   Sanda Linger, MD

## 2018-12-05 ENCOUNTER — Encounter: Payer: Self-pay | Admitting: Internal Medicine

## 2018-12-17 ENCOUNTER — Ambulatory Visit (HOSPITAL_COMMUNITY)
Admission: EM | Admit: 2018-12-17 | Discharge: 2018-12-17 | Disposition: A | Discharge status: Home / Self Care | Payer: BLUE CROSS/BLUE SHIELD | Source: Home / Self Care | Attending: Family Medicine | Admitting: Family Medicine

## 2018-12-17 ENCOUNTER — Emergency Department (HOSPITAL_COMMUNITY)
Admission: EM | Admit: 2018-12-17 | Discharge: 2018-12-17 | Disposition: A | Discharge status: Home / Self Care | Payer: BLUE CROSS/BLUE SHIELD | Attending: Emergency Medicine | Admitting: Emergency Medicine

## 2018-12-17 ENCOUNTER — Emergency Department (HOSPITAL_COMMUNITY): Payer: BLUE CROSS/BLUE SHIELD

## 2018-12-17 ENCOUNTER — Encounter (HOSPITAL_COMMUNITY): Payer: Self-pay | Admitting: Emergency Medicine

## 2018-12-17 ENCOUNTER — Other Ambulatory Visit: Payer: Self-pay

## 2018-12-17 DIAGNOSIS — R109 Unspecified abdominal pain: Secondary | ICD-10-CM | POA: Insufficient documentation

## 2018-12-17 DIAGNOSIS — N200 Calculus of kidney: Secondary | ICD-10-CM

## 2018-12-17 DIAGNOSIS — I10 Essential (primary) hypertension: Secondary | ICD-10-CM | POA: Insufficient documentation

## 2018-12-17 DIAGNOSIS — M545 Low back pain, unspecified: Secondary | ICD-10-CM

## 2018-12-17 DIAGNOSIS — Z87891 Personal history of nicotine dependence: Secondary | ICD-10-CM | POA: Insufficient documentation

## 2018-12-17 DIAGNOSIS — F909 Attention-deficit hyperactivity disorder, unspecified type: Secondary | ICD-10-CM | POA: Insufficient documentation

## 2018-12-17 DIAGNOSIS — Z79899 Other long term (current) drug therapy: Secondary | ICD-10-CM | POA: Diagnosis not present

## 2018-12-17 DIAGNOSIS — K760 Fatty (change of) liver, not elsewhere classified: Secondary | ICD-10-CM | POA: Diagnosis not present

## 2018-12-17 LAB — CBC
HCT: 42.7 % (ref 39.0–52.0)
Hemoglobin: 15.1 g/dL (ref 13.0–17.0)
MCH: 30.2 pg (ref 26.0–34.0)
MCHC: 35.4 g/dL (ref 30.0–36.0)
MCV: 85.4 fL (ref 80.0–100.0)
Platelets: 229 10*3/uL (ref 150–400)
RBC: 5 MIL/uL (ref 4.22–5.81)
RDW: 12.8 % (ref 11.5–15.5)
WBC: 8.5 10*3/uL (ref 4.0–10.5)
nRBC: 0 % (ref 0.0–0.2)

## 2018-12-17 LAB — URINALYSIS, ROUTINE W REFLEX MICROSCOPIC
Bilirubin Urine: NEGATIVE
Glucose, UA: NEGATIVE mg/dL
Hgb urine dipstick: NEGATIVE
Ketones, ur: NEGATIVE mg/dL
Leukocytes,Ua: NEGATIVE
Nitrite: NEGATIVE
Protein, ur: NEGATIVE mg/dL
Specific Gravity, Urine: 1.028 (ref 1.005–1.030)
pH: 6 (ref 5.0–8.0)

## 2018-12-17 LAB — POCT URINALYSIS DIP (DEVICE)
Bilirubin Urine: NEGATIVE
Glucose, UA: NEGATIVE mg/dL
Ketones, ur: NEGATIVE mg/dL
Leukocytes,Ua: NEGATIVE
Nitrite: NEGATIVE
Protein, ur: NEGATIVE mg/dL
Specific Gravity, Urine: >=1.03 (ref 1.005–1.030)
Urobilinogen, UA: 0.2 mg/dL (ref 0.0–1.0)
pH: 6 (ref 5.0–8.0)

## 2018-12-17 LAB — BASIC METABOLIC PANEL
Anion gap: 8 (ref 5–15)
BUN: 15 mg/dL (ref 6–20)
CO2: 27 mmol/L (ref 22–32)
Calcium: 8.8 mg/dL — ABNORMAL LOW (ref 8.9–10.3)
Chloride: 103 mmol/L (ref 98–111)
Creatinine, Ser: 1.46 mg/dL — ABNORMAL HIGH (ref 0.61–1.24)
GFR calc Af Amer: >60 mL/min (ref >60)
GFR calc non Af Amer: >60 mL/min (ref >60)
Glucose, Bld: 87 mg/dL (ref 70–99)
Potassium: 3.7 mmol/L (ref 3.5–5.1)
Sodium: 138 mmol/L (ref 135–145)

## 2018-12-17 NOTE — ED Notes (Signed)
Pt in CT.
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
## 2018-12-17 NOTE — ED Triage Notes (Signed)
C/o left flank pain x 1 wek; reported similar symptoms from prior kidney stones episode last December

## 2018-12-17 NOTE — ED Notes (Signed)
Pt able to ambulate around room without appearance of pain or discomfort.

## 2018-12-17 NOTE — Discharge Instructions (Signed)
TO ER.  

## 2018-12-17 NOTE — Discharge Instructions (Addendum)
The muscles involved in your back pain appear to be the  1) Quadratus Lumborum 2) Illiopsoas Look these up for self treatment techniques SEEK IMMEDIATE MEDICAL ATTENTION IF: New numbness, tingling, weakness, or problem with the use of your arms or legs.  Severe back pain not relieved with medications.  Change in bowel or bladder control.  Increasing pain in any areas of the body (such as chest or abdominal pain).  Shortness of breath, dizziness or fainting.  Nausea (feeling sick to your stomach), vomiting, fever, or sweats.

## 2018-12-17 NOTE — ED Notes (Signed)
Patient is being referred from the Urgent Care Center to the Emergency Department by staff. Patient is stable but in need of higher level of care due to need for CT scan for evaluation of possible kidney stone. Patient is aware and verbalizes understanding of plan of care.  Vitals:   12/17/18 1746  BP: 127/77  Pulse: 73  Resp: 18  Temp: 99 F (37.2 C)  SpO2: 96%

## 2018-12-17 NOTE — ED Triage Notes (Signed)
Left flank pain for one week.  Pain is intermittent.  Patient has a history of kidney stone, says this feels the same

## 2018-12-17 NOTE — ED Provider Notes (Signed)
Patient registered to be seen for a kidney stone.  When I got in the room he let me know that his last kidney stone required operative removal.  He feels the same as he did last time.  He is requesting a CAT scan, and evaluation, and referral to urology.  He feels like he may have to spend the night in the hospital.  I let them know that I cannot provide the services here at the urgent care center.  He wished to go to the emergency room for care and was discharged.   Eustace Moore, MD 12/17/18 1944

## 2018-12-24 ENCOUNTER — Telehealth: Payer: Self-pay | Admitting: Internal Medicine

## 2018-12-24 NOTE — Telephone Encounter (Signed)
Copied from CRM [id number]. Topic: Quick Communication - Rx Refill/Question >> Dec 24, 2018 10:21 AM Jolayne Haines L wrote: Medication: amphetamine-dextroamphetamine (ADDERALL) 20 MG ( needs it for next week )  Has the patient contacted their pharmacy? No  (Agent: If no, request that the patient contact the pharmacy for the refill.) (Agent: If yes, when and what did the pharmacy advise?)  Preferred Pharmacy (with phone number or street name): [phone] [place], [place] - [address] AT [facility] [address] [place] [address] Phone: [phone] Fax: [phone]    Agent: Please be advised that RX refills may take up to 3 business days. We ask that you follow-up with your pharmacy.

## 2018-12-24 NOTE — Telephone Encounter (Signed)
Per database, last filled on 12/03/2018.   Pt is not out of medication and knows it is not due until next week.

## 2018-12-30 DIAGNOSIS — G4733 Obstructive sleep apnea (adult) (pediatric): Secondary | ICD-10-CM | POA: Diagnosis not present

## 2018-12-30 DIAGNOSIS — J351 Hypertrophy of tonsils: Secondary | ICD-10-CM | POA: Diagnosis not present

## 2018-12-30 DIAGNOSIS — J3489 Other specified disorders of nose and nasal sinuses: Secondary | ICD-10-CM | POA: Diagnosis not present

## 2018-12-30 DIAGNOSIS — I10 Essential (primary) hypertension: Secondary | ICD-10-CM | POA: Diagnosis not present

## 2019-01-04 ENCOUNTER — Ambulatory Visit (INDEPENDENT_AMBULATORY_CARE_PROVIDER_SITE_OTHER): Payer: BC Managed Care – PPO | Admitting: Bariatrics

## 2019-01-04 ENCOUNTER — Ambulatory Visit (INDEPENDENT_AMBULATORY_CARE_PROVIDER_SITE_OTHER): Payer: Self-pay | Admitting: Family Medicine

## 2019-01-06 ENCOUNTER — Ambulatory Visit (INDEPENDENT_AMBULATORY_CARE_PROVIDER_SITE_OTHER): Payer: Self-pay | Admitting: Psychology

## 2019-01-18 ENCOUNTER — Ambulatory Visit (INDEPENDENT_AMBULATORY_CARE_PROVIDER_SITE_OTHER): Payer: Self-pay | Admitting: Bariatrics

## 2019-01-19 ENCOUNTER — Other Ambulatory Visit: Payer: Self-pay | Admitting: Internal Medicine

## 2019-01-19 ENCOUNTER — Telehealth: Payer: Self-pay | Admitting: Internal Medicine

## 2019-01-19 DIAGNOSIS — F900 Attention-deficit hyperactivity disorder, predominantly inattentive type: Secondary | ICD-10-CM

## 2019-01-19 MED ORDER — AMPHETAMINE-DEXTROAMPHETAMINE 20 MG PO TABS: 20.0000 mg | ORAL_TABLET | Freq: Every day | ORAL | 0 refills | Status: DC

## 2019-01-19 NOTE — Telephone Encounter (Signed)
Relation to pt: self  Call back number: [phone] Pharmacy: [id number] [place], [place] [phone] (Phone) [phone] (Fax)      Reason for call:  30 day supply of amphetamine-dextroamphetamine (ADDERALL) 20 MG tablet instead of 90 day (insurance will not cover 90 day) , informed patient please allow 72 hour turn around

## 2019-01-25 DIAGNOSIS — E669 Obesity, unspecified: Secondary | ICD-10-CM | POA: Diagnosis not present

## 2019-01-25 DIAGNOSIS — I159 Secondary hypertension, unspecified: Secondary | ICD-10-CM | POA: Diagnosis not present

## 2019-01-25 DIAGNOSIS — R7302 Impaired glucose tolerance (oral): Secondary | ICD-10-CM | POA: Diagnosis not present

## 2019-01-25 DIAGNOSIS — G4733 Obstructive sleep apnea (adult) (pediatric): Secondary | ICD-10-CM | POA: Diagnosis not present

## 2019-02-04 DIAGNOSIS — J351 Hypertrophy of tonsils: Secondary | ICD-10-CM | POA: Diagnosis not present

## 2019-02-04 DIAGNOSIS — J343 Hypertrophy of nasal turbinates: Secondary | ICD-10-CM | POA: Diagnosis not present

## 2019-02-04 DIAGNOSIS — J342 Deviated nasal septum: Secondary | ICD-10-CM | POA: Diagnosis not present

## 2019-02-04 DIAGNOSIS — G4733 Obstructive sleep apnea (adult) (pediatric): Secondary | ICD-10-CM | POA: Diagnosis not present

## 2019-02-10 DIAGNOSIS — Z20828 Contact with and (suspected) exposure to other viral communicable diseases: Secondary | ICD-10-CM | POA: Diagnosis not present

## 2019-02-15 ENCOUNTER — Telehealth: Payer: Self-pay | Admitting: Internal Medicine

## 2019-02-15 NOTE — Telephone Encounter (Signed)
amphetamine-dextroamphetamine (ADDERALL) 20 MG tablet   [facility]

## 2019-02-18 NOTE — Telephone Encounter (Signed)
He needs to be seen

## 2019-02-19 ENCOUNTER — Telehealth: Payer: Self-pay | Admitting: Internal Medicine

## 2019-02-19 NOTE — Telephone Encounter (Signed)
Patient states that he would like to schedule med follow up as a doxy.  Please advise.

## 2019-02-19 NOTE — Telephone Encounter (Signed)
Patient called to say that he will be having a Tonsillectomy on 02/24/2019 and may not be able to talk much so he is asking can this Rx be refilled and he schedule a future appointment. Asking for a call back with an answer please. Ph# [phone]

## 2019-02-19 NOTE — Telephone Encounter (Signed)
Left pt vm to call back to schedule. 

## 2019-02-22 ENCOUNTER — Other Ambulatory Visit: Payer: Self-pay | Admitting: Internal Medicine

## 2019-02-22 DIAGNOSIS — F900 Attention-deficit hyperactivity disorder, predominantly inattentive type: Secondary | ICD-10-CM

## 2019-02-22 MED ORDER — AMPHETAMINE-DEXTROAMPHETAMINE 20 MG PO TABS: 20.0000 mg | ORAL_TABLET | Freq: Every day | ORAL | 0 refills | Status: DC

## 2019-02-22 NOTE — Telephone Encounter (Signed)
When do you need to see patient again. Last OV was in May via [facility].

## 2019-02-22 NOTE — Telephone Encounter (Signed)
Patient would like to know if her could get script for medication today and then schedule follow up later.  States that he is having tonsil surgery on wed and would not be able to come in.

## 2019-02-24 DIAGNOSIS — G4730 Sleep apnea, unspecified: Secondary | ICD-10-CM | POA: Diagnosis not present

## 2019-02-24 DIAGNOSIS — F988 Other specified behavioral and emotional disorders with onset usually occurring in childhood and adolescence: Secondary | ICD-10-CM | POA: Diagnosis not present

## 2019-02-24 DIAGNOSIS — J342 Deviated nasal septum: Secondary | ICD-10-CM | POA: Diagnosis not present

## 2019-02-24 DIAGNOSIS — I10 Essential (primary) hypertension: Secondary | ICD-10-CM | POA: Diagnosis not present

## 2019-02-24 DIAGNOSIS — J343 Hypertrophy of nasal turbinates: Secondary | ICD-10-CM | POA: Diagnosis not present

## 2019-02-24 DIAGNOSIS — Z79899 Other long term (current) drug therapy: Secondary | ICD-10-CM | POA: Diagnosis not present

## 2019-02-24 DIAGNOSIS — J351 Hypertrophy of tonsils: Secondary | ICD-10-CM | POA: Diagnosis not present

## 2019-02-25 DIAGNOSIS — Z79899 Other long term (current) drug therapy: Secondary | ICD-10-CM | POA: Diagnosis not present

## 2019-02-25 DIAGNOSIS — G4730 Sleep apnea, unspecified: Secondary | ICD-10-CM | POA: Diagnosis not present

## 2019-02-25 DIAGNOSIS — J342 Deviated nasal septum: Secondary | ICD-10-CM | POA: Diagnosis not present

## 2019-02-25 DIAGNOSIS — J351 Hypertrophy of tonsils: Secondary | ICD-10-CM | POA: Diagnosis not present

## 2019-02-25 DIAGNOSIS — F988 Other specified behavioral and emotional disorders with onset usually occurring in childhood and adolescence: Secondary | ICD-10-CM | POA: Diagnosis not present

## 2019-02-25 DIAGNOSIS — I10 Essential (primary) hypertension: Secondary | ICD-10-CM | POA: Diagnosis not present

## 2019-02-25 DIAGNOSIS — J343 Hypertrophy of nasal turbinates: Secondary | ICD-10-CM | POA: Diagnosis not present

## 2019-03-11 ENCOUNTER — Other Ambulatory Visit: Payer: Self-pay

## 2019-03-11 ENCOUNTER — Encounter: Payer: Self-pay | Admitting: Internal Medicine

## 2019-03-11 ENCOUNTER — Ambulatory Visit (INDEPENDENT_AMBULATORY_CARE_PROVIDER_SITE_OTHER): Payer: BC Managed Care – PPO | Admitting: Internal Medicine

## 2019-03-11 ENCOUNTER — Other Ambulatory Visit (INDEPENDENT_AMBULATORY_CARE_PROVIDER_SITE_OTHER): Payer: BC Managed Care – PPO

## 2019-03-11 VITALS — BP 128/86 | HR 70 | Temp 97.8°F | Resp 16 | Ht 70.0 in | Wt 269.0 lb

## 2019-03-11 DIAGNOSIS — F900 Attention-deficit hyperactivity disorder, predominantly inattentive type: Secondary | ICD-10-CM

## 2019-03-11 DIAGNOSIS — R7303 Prediabetes: Secondary | ICD-10-CM | POA: Diagnosis not present

## 2019-03-11 DIAGNOSIS — I119 Hypertensive heart disease without heart failure: Secondary | ICD-10-CM | POA: Diagnosis not present

## 2019-03-11 DIAGNOSIS — I10 Essential (primary) hypertension: Secondary | ICD-10-CM

## 2019-03-11 DIAGNOSIS — Z0000 Encounter for general adult medical examination without abnormal findings: Secondary | ICD-10-CM

## 2019-03-11 DIAGNOSIS — E559 Vitamin D deficiency, unspecified: Secondary | ICD-10-CM

## 2019-03-11 DIAGNOSIS — Z5181 Encounter for therapeutic drug level monitoring: Secondary | ICD-10-CM

## 2019-03-11 DIAGNOSIS — Z79899 Other long term (current) drug therapy: Secondary | ICD-10-CM

## 2019-03-11 DIAGNOSIS — E876 Hypokalemia: Secondary | ICD-10-CM

## 2019-03-11 DIAGNOSIS — Z0001 Encounter for general adult medical examination with abnormal findings: Secondary | ICD-10-CM

## 2019-03-11 DIAGNOSIS — T502X5A Adverse effect of carbonic-anhydrase inhibitors, benzothiadiazides and other diuretics, initial encounter: Secondary | ICD-10-CM

## 2019-03-11 LAB — VITAMIN D 25 HYDROXY (VIT D DEFICIENCY, FRACTURES): VITD: 23.85 ng/mL — ABNORMAL LOW (ref 30.00–100.00)

## 2019-03-11 LAB — POCT GLYCOSYLATED HEMOGLOBIN (HGB A1C): Hemoglobin A1C: 5.7 % — AB (ref 4.0–5.6)

## 2019-03-11 LAB — URINALYSIS, ROUTINE W REFLEX MICROSCOPIC
Bilirubin Urine: NEGATIVE
Ketones, ur: NEGATIVE
Leukocytes,Ua: NEGATIVE
Nitrite: NEGATIVE
RBC / HPF: NONE SEEN (ref 0–?)
Specific Gravity, Urine: >=1.03 — AB (ref 1.000–1.030)
Total Protein, Urine: NEGATIVE
Urine Glucose: NEGATIVE
Urobilinogen, UA: 0.2 (ref 0.0–1.0)
pH: 6 (ref 5.0–8.0)

## 2019-03-11 LAB — CBC WITH DIFFERENTIAL/PLATELET
Basophils Absolute: 0.1 10*3/uL (ref 0.0–0.1)
Basophils Relative: 1 % (ref 0.0–3.0)
Eosinophils Absolute: 0.2 10*3/uL (ref 0.0–0.7)
Eosinophils Relative: 2.3 % (ref 0.0–5.0)
HCT: 41.1 % (ref 39.0–52.0)
Hemoglobin: 14.4 g/dL (ref 13.0–17.0)
Lymphocytes Relative: 32.5 % (ref 12.0–46.0)
Lymphs Abs: 3 10*3/uL (ref 0.7–4.0)
MCHC: 35 g/dL (ref 30.0–36.0)
MCV: 87.4 fl (ref 78.0–100.0)
Monocytes Absolute: 0.8 10*3/uL (ref 0.1–1.0)
Monocytes Relative: 8.9 % (ref 3.0–12.0)
Neutro Abs: 5.1 10*3/uL (ref 1.4–7.7)
Neutrophils Relative %: 55.3 % (ref 43.0–77.0)
Platelets: 277 10*3/uL (ref 150.0–400.0)
RBC: 4.7 Mil/uL (ref 4.22–5.81)
RDW: 12.5 % (ref 11.5–15.5)
WBC: 9.3 10*3/uL (ref 4.0–10.5)

## 2019-03-11 LAB — BASIC METABOLIC PANEL
BUN: 21 mg/dL (ref 6–23)
CO2: 28 mEq/L (ref 19–32)
Calcium: 8.8 mg/dL (ref 8.4–10.5)
Chloride: 104 mEq/L (ref 96–112)
Creatinine, Ser: 1.15 mg/dL (ref 0.40–1.50)
GFR: 72.36 mL/min (ref >60.00)
Glucose, Bld: 96 mg/dL (ref 70–99)
Potassium: 3.4 mEq/L — ABNORMAL LOW (ref 3.5–5.1)
Sodium: 140 mEq/L (ref 135–145)

## 2019-03-11 LAB — HEPATIC FUNCTION PANEL
ALT: 29 U/L (ref 0–53)
AST: 13 U/L (ref 0–37)
Albumin: 4.1 g/dL (ref 3.5–5.2)
Alkaline Phosphatase: 57 U/L (ref 39–117)
Bilirubin, Direct: 0.1 mg/dL (ref 0.0–0.3)
Total Bilirubin: 0.4 mg/dL (ref 0.2–1.2)
Total Protein: 6.4 g/dL (ref 6.0–8.3)

## 2019-03-11 LAB — LIPID PANEL
Cholesterol: 200 mg/dL (ref 0–200)
HDL: 37.8 mg/dL — ABNORMAL LOW (ref >39.00)
LDL Cholesterol: 134 mg/dL — ABNORMAL HIGH (ref 0–99)
NonHDL: 161.8
Total CHOL/HDL Ratio: 5
Triglycerides: 139 mg/dL (ref 0.0–149.0)
VLDL: 27.8 mg/dL (ref 0.0–40.0)

## 2019-03-11 LAB — TSH: TSH: 0.93 u[IU]/mL (ref 0.35–4.50)

## 2019-03-11 MED ORDER — TELMISARTAN-HCTZ 80-12.5 MG PO TABS: 1.0000 | ORAL_TABLET | Freq: Every day | ORAL | 1 refills | Status: DC

## 2019-03-11 MED ORDER — CHOLECALCIFEROL 50 MCG (2000 UT) PO TABS: 1.0000 | ORAL_TABLET | Freq: Every day | ORAL | 1 refills | Status: DC

## 2019-03-11 MED ORDER — POTASSIUM CHLORIDE CRYS ER 20 MEQ PO TBCR: 20.0000 meq | EXTENDED_RELEASE_TABLET | Freq: Two times a day (BID) | ORAL | 1 refills | Status: DC

## 2019-03-11 MED ORDER — AMPHETAMINE-DEXTROAMPHETAMINE 20 MG PO TABS: 20.0000 mg | ORAL_TABLET | Freq: Every day | ORAL | 0 refills | Status: DC

## 2019-03-11 MED ORDER — METFORMIN HCL 500 MG PO TABS: 500.0000 mg | ORAL_TABLET | Freq: Two times a day (BID) | ORAL | 1 refills | Status: DC

## 2019-03-11 NOTE — Progress Notes (Signed)
Subjective:  Patient ID: Cory Little, male    DOB: January 22, 1986  Age: 33 y.o. MRN: [id number]  CC: Annual Exam, Hypertension, and Hyperlipidemia   HPI Cory Little presents for a CPX.  Pt states ADD status overall stable on current meds with overall good compliance and tolerability, and good effectiveness with respect to ability for concentration and task completion. He is studying to get an [facility].  He tells me his blood pressure has been well controlled on the combination of an ARB and thiazide diuretic.  He is active and denies any recent episodes of CP, DOE, palpitations, edema, or fatigue.   Outpatient Medications Prior to Visit  Medication Sig Dispense Refill   amphetamine-dextroamphetamine (ADDERALL) 20 MG tablet Take 1 tablet (20 mg total) by mouth daily. 30 tablet 0   TELMISARTAN-HCTZ PO Take by mouth.     No facility-administered medications prior to visit.     ROS Review of Systems  Constitutional: Negative.  Negative for diaphoresis, fatigue and unexpected weight change.  HENT: Negative.   Eyes: Negative.   Respiratory: Negative.  Negative for cough, chest tightness, shortness of breath and wheezing.   Cardiovascular: Negative for chest pain, palpitations and leg swelling.  Gastrointestinal: Negative for abdominal pain, constipation, diarrhea, nausea and vomiting.  Endocrine: Negative.   Genitourinary: Negative.  Negative for difficulty urinating, dysuria, hematuria, penile swelling, scrotal swelling, testicular pain and urgency.  Musculoskeletal: Negative for arthralgias and myalgias.  Skin: Negative.   Neurological: Negative.  Negative for dizziness, facial asymmetry, weakness and light-headedness.  Hematological: Negative for adenopathy. Does not bruise/bleed easily.  Psychiatric/Behavioral: Positive for decreased concentration. Negative for agitation, behavioral problems, confusion, dysphoric mood, hallucinations, self-injury, sleep disturbance and suicidal  ideas. The patient is not nervous/anxious and is not hyperactive.     Objective:  BP 128/86 (BP Location: Left Arm, Patient Position: Sitting, Cuff Size: Large)    Pulse 70    Temp 97.8 F (36.6 C) (Oral)    Resp 16    Ht 5\' 10"  (1.778 m)    Wt 269 lb (122 kg)    SpO2 98%    BMI 38.60 kg/m   BP Readings from Last 3 Encounters:  03/11/19 128/86  12/17/18 (!) 142/102  12/17/18 127/77    Wt Readings from Last 3 Encounters:  03/11/19 269 lb (122 kg)  12/17/18 270 lb (122.5 kg)  02/04/18 266 lb (120.7 kg)    Physical Exam Vitals signs reviewed.  Constitutional:      Appearance: He is obese. He is not ill-appearing or diaphoretic.  HENT:     Nose: Nose normal.     Mouth/Throat:     Mouth: Mucous membranes are moist.  Eyes:     General: No scleral icterus.    Conjunctiva/sclera: Conjunctivae normal.  Neck:     Musculoskeletal: Normal range of motion. No neck rigidity or muscular tenderness.  Cardiovascular:     Rate and Rhythm: Normal rate and regular rhythm.     Heart sounds: No murmur.  Pulmonary:     Effort: Pulmonary effort is normal. No respiratory distress.     Breath sounds: No stridor. No wheezing, rhonchi or rales.  Abdominal:     General: Abdomen is protuberant. Bowel sounds are normal. There is no distension or abdominal bruit.     Palpations: There is no hepatomegaly or splenomegaly.     Tenderness: There is no abdominal tenderness.  Musculoskeletal: Normal range of motion.     Right lower leg: No  edema.     Left lower leg: No edema.  Lymphadenopathy:     Cervical: No cervical adenopathy.  Skin:    General: Skin is warm and dry.     Coloration: Skin is not pale.  Neurological:     General: No focal deficit present.     Mental Status: He is alert and oriented to person, place, and time. Mental status is at baseline.  Psychiatric:        Mood and Affect: Mood normal.        Behavior: Behavior normal.        Thought Content: Thought content normal.         Judgment: Judgment normal.     Lab Results  Component Value Date   WBC 9.3 03/11/2019   HGB 14.4 03/11/2019   HCT 41.1 03/11/2019   PLT 277.0 03/11/2019   GLUCOSE 96 03/11/2019   CHOL 200 03/11/2019   TRIG 139.0 03/11/2019   HDL 37.80 (L) 03/11/2019   LDLDIRECT 155.0 04/16/2017   LDLCALC 134 (H) 03/11/2019   ALT 29 03/11/2019   AST 13 03/11/2019   NA 140 03/11/2019   K 3.4 (L) 03/11/2019   CL 104 03/11/2019   CREATININE 1.15 03/11/2019   BUN 21 03/11/2019   CO2 28 03/11/2019   TSH 0.93 03/11/2019   HGBA1C 5.7 (A) 03/11/2019    Ct Renal Stone Study  Result Date: 12/17/2018 CLINICAL DATA:  Left flank pain.  History of kidney stones. EXAM: CT ABDOMEN AND PELVIS WITHOUT CONTRAST TECHNIQUE: Multidetector CT imaging of the abdomen and pelvis was performed following the standard protocol without IV contrast. COMPARISON:  12/21/2016 FINDINGS: LOWER CHEST: There is no basilar pleural or apical pericardial effusion. HEPATOBILIARY: Diffusely nodular hepatic contours with relative hypertrophy of the caudate and left hepatic lobe, consistent with hepatic cirrhosis. No focal liver lesion. No biliary dilatation. The gallbladder is normal. PANCREAS: The pancreatic parenchymal contours are normal and there is no ductal dilatation. There is no peripancreatic fluid collection. SPLEEN: Normal. ADRENALS/URINARY TRACT: --Adrenal glands: Normal. --Right kidney/ureter: No hydronephrosis, nephroureterolithiasis, perinephric stranding or solid renal mass. --Left kidney/ureter: No hydronephrosis, nephroureterolithiasis, perinephric stranding or solid renal mass. --Urinary bladder: Normal for degree of distention STOMACH/BOWEL: --Stomach/Duodenum: There is no hiatal hernia or other gastric abnormality. The duodenal course and caliber are normal. --Small bowel: No dilatation or inflammation. --Colon: No focal abnormality. --Appendix: Normal. VASCULAR/LYMPHATIC: Normal course and caliber of the major abdominal  vessels. No abdominal or pelvic lymphadenopathy. REPRODUCTIVE: No free fluid in the pelvis. MUSCULOSKELETAL. No bony spinal canal stenosis or focal osseous abnormality. OTHER: None. IMPRESSION: 1. No obstructive uropathy or nephrolithiasis. 2. Hepatic steatosis. Electronically Signed   By: Deatra RobinsonKevin  Herman M.D.   On: 12/17/2018 20:37    Assessment & Plan:   Beulah GandyRudolph was seen today for annual exam, hypertension and hyperlipidemia.  Diagnoses and all orders for this visit:  Essential hypertension- His blood pressure is adequately well controlled but he has developed hypokalemia from the thiazide diuretic.  I will treat the hypokalemia.  I will treat the vitamin D deficiency.  His labs are otherwise negative for secondary causes or endorgan damage. -     CBC with Differential/Platelet; Future -     Basic metabolic panel; Future -     TSH; Future -     Urinalysis, Routine w reflex microscopic; Future -     Drug Abuse 10-50+Ethanol, U; Future -     Hepatic function panel; Future -  telmisartan-hydrochlorothiazide (MICARDIS HCT) 80-12.5 MG tablet; Take 1 tablet by mouth daily. -     potassium chloride SA (K-DUR) 20 MEQ tablet; Take 1 tablet (20 mEq total) by mouth 2 (two) times daily.  Hypertensive left ventricular hypertrophy, without heart failure- There is no evidence of fluid overload and his blood pressure is adequately well controlled. -     telmisartan-hydrochlorothiazide (MICARDIS HCT) 80-12.5 MG tablet; Take 1 tablet by mouth daily.  Vitamin D deficiency -     VITAMIN D 25 Hydroxy (Vit-D Deficiency, Fractures); Future -     Cholecalciferol 50 MCG (2000 UT) TABS; Take 1 tablet (2,000 Units total) by mouth daily.  Encounter for monitoring stimulant therapy- His urine drug screen is negative for amphetamines.  It is positive for marijuana which is already known.  It is also positive for opiates.  Checking the database shows that he is getting opiates from another provider. -     Drug Abuse  10-50+Ethanol, U; Future  Routine general medical examination at a health care facility -     Lipid panel; Future  Prediabetes -     POCT glycosylated hemoglobin (Hb A1C) -     metFORMIN (GLUCOPHAGE) 500 MG tablet; Take 1 tablet (500 mg total) by mouth 2 (two) times daily with a meal.  Diuretic-induced hypokalemia -     potassium chloride SA (K-DUR) 20 MEQ tablet; Take 1 tablet (20 mEq total) by mouth 2 (two) times daily.  Attention deficit hyperactivity disorder (ADHD), predominantly inattentive type -     amphetamine-dextroamphetamine (ADDERALL) 20 MG tablet; Take 1 tablet (20 mg total) by mouth daily.   I have discontinued Beulah Gandyudolph L. Snell's TELMISARTAN-HCTZ PO. I am also having him start on metFORMIN, potassium chloride SA, and Cholecalciferol. Additionally, I am having him maintain his telmisartan-hydrochlorothiazide and amphetamine-dextroamphetamine.  Meds ordered this encounter  Medications   telmisartan-hydrochlorothiazide (MICARDIS HCT) 80-12.5 MG tablet    Sig: Take 1 tablet by mouth daily.    Dispense:  90 tablet    Refill:  1   metFORMIN (GLUCOPHAGE) 500 MG tablet    Sig: Take 1 tablet (500 mg total) by mouth 2 (two) times daily with a meal.    Dispense:  180 tablet    Refill:  1   potassium chloride SA (K-DUR) 20 MEQ tablet    Sig: Take 1 tablet (20 mEq total) by mouth 2 (two) times daily.    Dispense:  180 tablet    Refill:  1   Cholecalciferol 50 MCG (2000 UT) TABS    Sig: Take 1 tablet (2,000 Units total) by mouth daily.    Dispense:  90 tablet    Refill:  1   amphetamine-dextroamphetamine (ADDERALL) 20 MG tablet    Sig: Take 1 tablet (20 mg total) by mouth daily.    Dispense:  30 tablet    Refill:  0     Follow-up: Return in about 6 months (around 09/11/2019).  Sanda Lingerhomas Honestee Revard, MD

## 2019-03-11 NOTE — Patient Instructions (Signed)

## 2019-03-14 ENCOUNTER — Encounter: Payer: Self-pay | Admitting: Internal Medicine

## 2019-03-14 LAB — DRUG ABUSE 10-50+ETHANOL, U
ALCOHOL, ETHYL (U): NEGATIVE
AMPHETAMINES (1000 ng/mL SCRN): NEGATIVE
BARBITURATES: NEGATIVE
BENZODIAZEPINES: NEGATIVE
COCAINE METABOLITES: NEGATIVE
HYDROCODONE: POSITIVE — AB
HYDROMORPHONE: POSITIVE — AB
MARIJUANA MET (50 ng/mL SCRN): POSITIVE — AB
METHADONE: NEGATIVE
METHAQUALONE: NEGATIVE
OPIATES: POSITIVE — AB
PHENCYCLIDINE: NEGATIVE
PROPOXYPHENE: NEGATIVE

## 2019-04-19 DIAGNOSIS — J3489 Other specified disorders of nose and nasal sinuses: Secondary | ICD-10-CM | POA: Diagnosis not present

## 2019-04-19 IMAGING — CT CT RENAL STONE PROTOCOL
2 of 3 series · 16 of 46 positions shown, 18 images · non-contrast
Comparison: None.

CLINICAL DATA: Acute onset of fever and chills. Dark urine. Neck
and leg pain, acute onset. Initial encounter.

EXAM:
CT ABDOMEN AND PELVIS WITHOUT CONTRAST
TECHNIQUE: Multidetector CT imaging of the abdomen and pelvis was performed
following the standard protocol without IV contrast.

[Series 3: coronal · coronal · 0.74mm/px · 3 of 188 slices shown]
[im 63/188  soft-tissue]
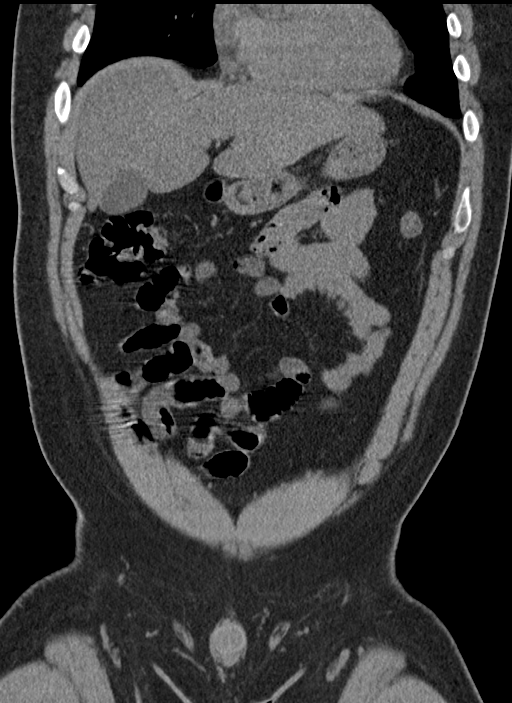
[im 84/188  soft-tissue]
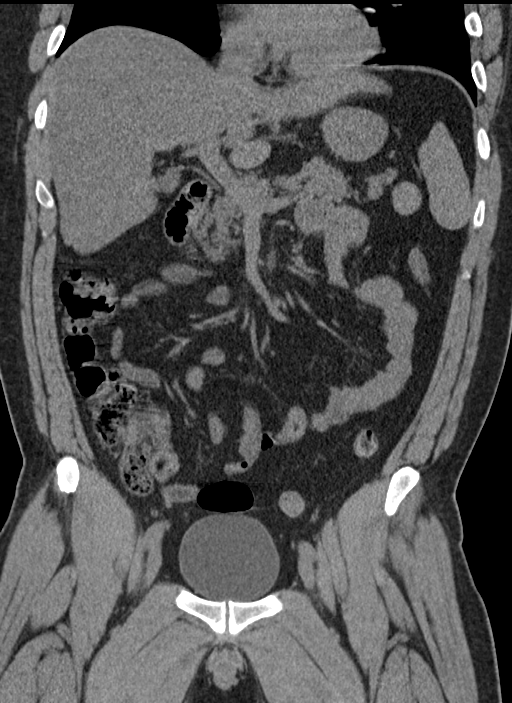
[im 104/188  soft-tissue]
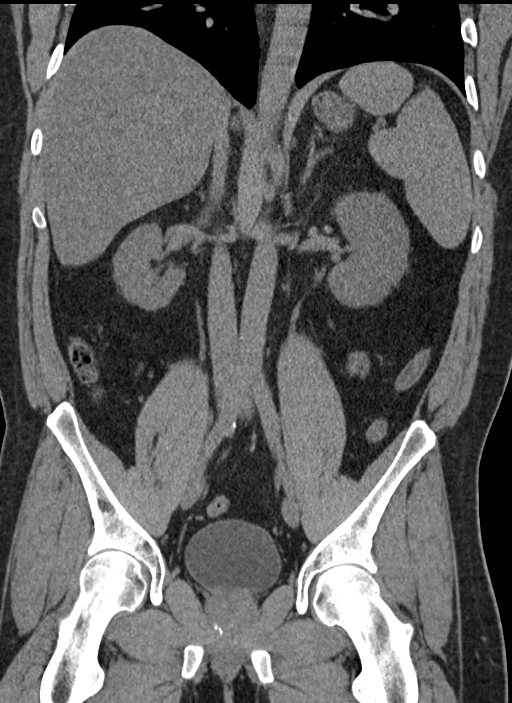

[Series 6: lung · axial · 0.88mm/px · z∈[+1409,+1539]mm · 13 of 75 slices shown, 15 images]
[im 5/75  soft-tissue]
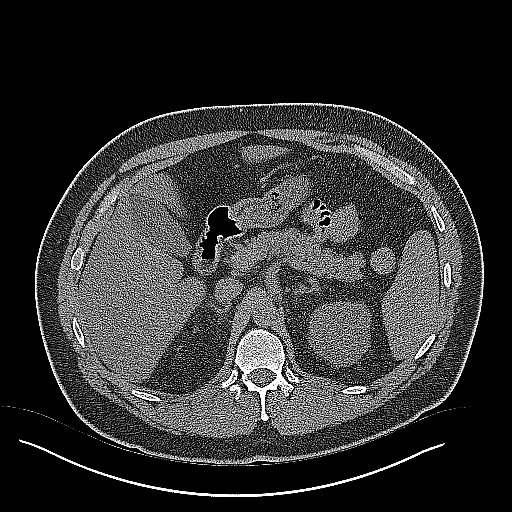
[im 5/75  bone]
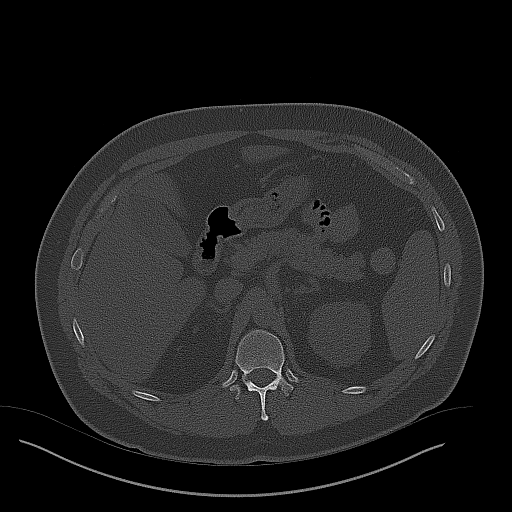
[im 10/75  soft-tissue]
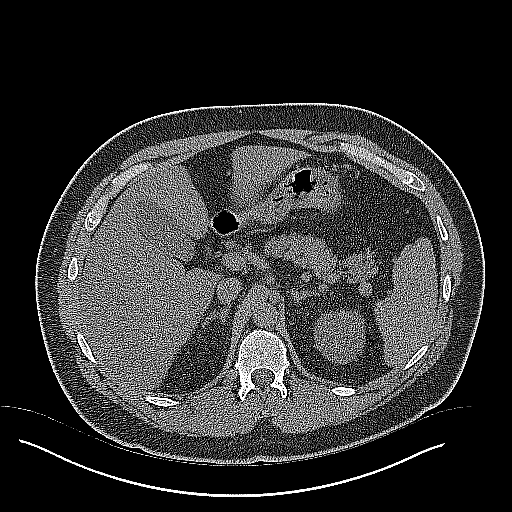
[im 15/75  soft-tissue]
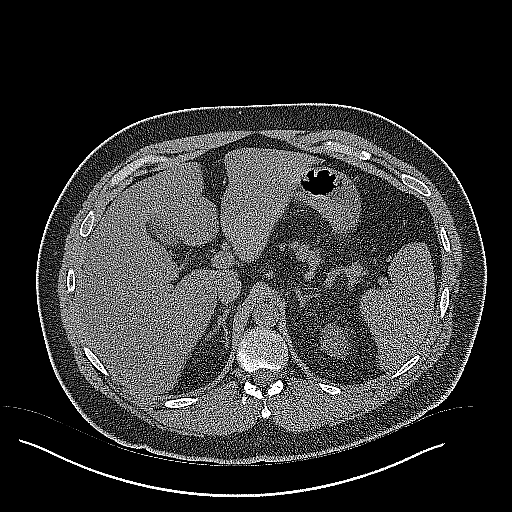
[im 22/75  soft-tissue]
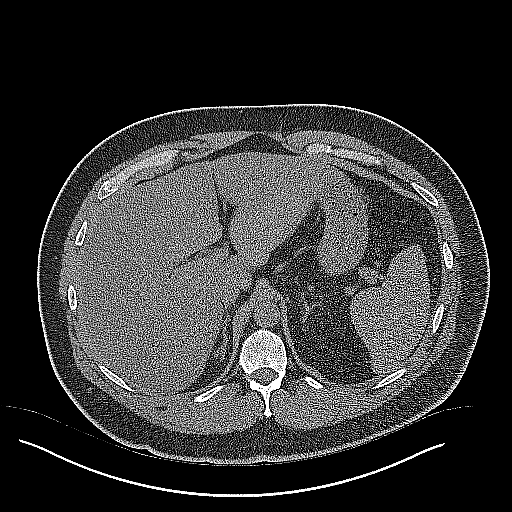
[im 27/75  soft-tissue]
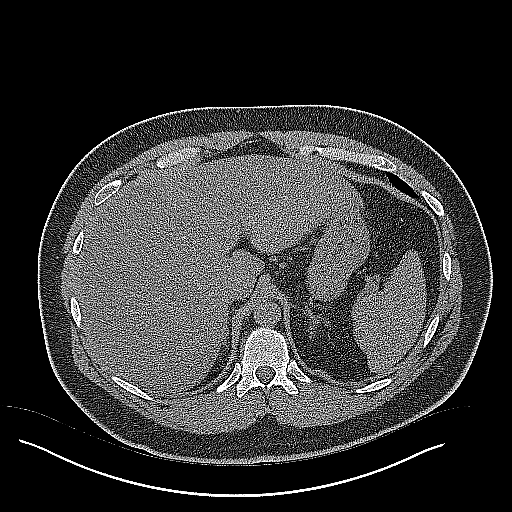
[im 32/75  soft-tissue]
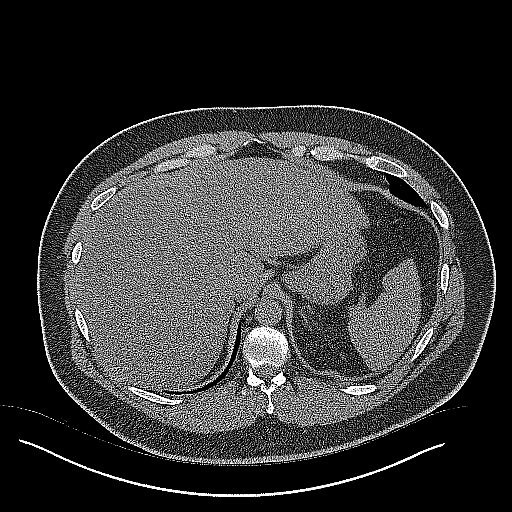
[im 39/75  soft-tissue]
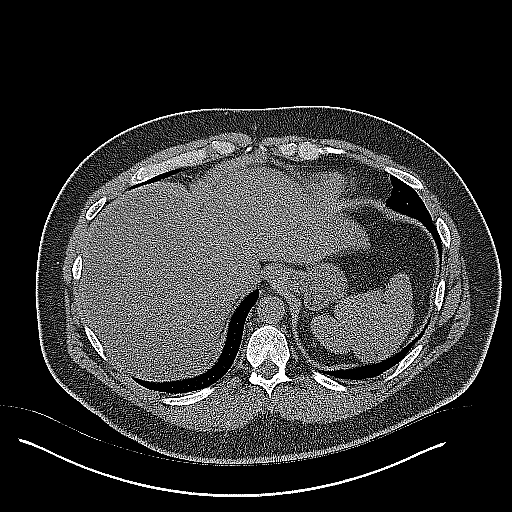
[im 43/75  soft-tissue]
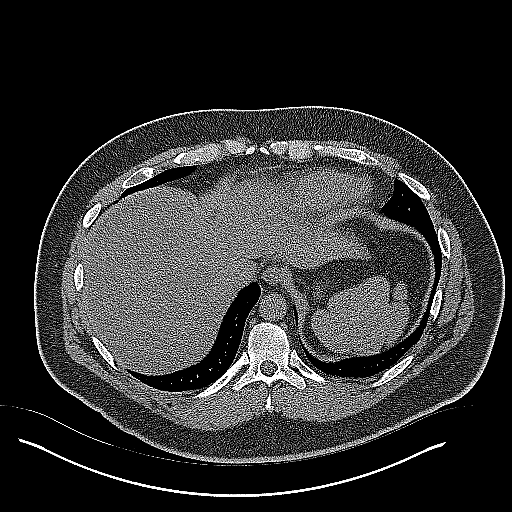
[im 48/75  soft-tissue]
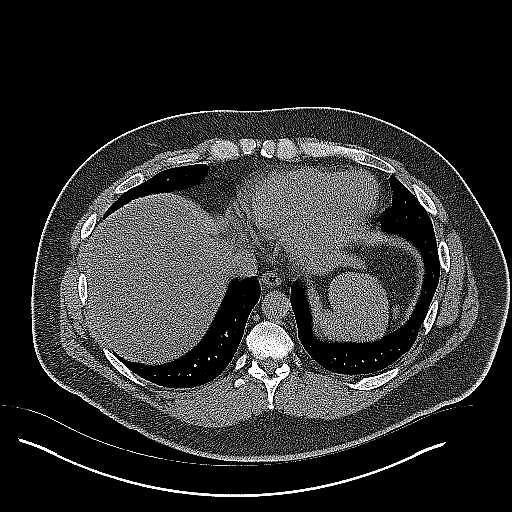
[im 48/75  bone]
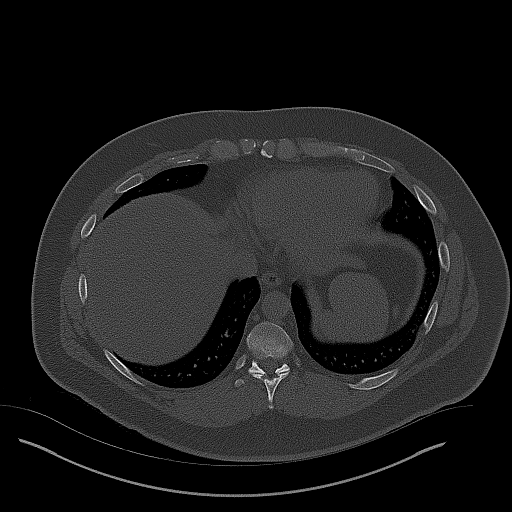
[im 53/75  soft-tissue]
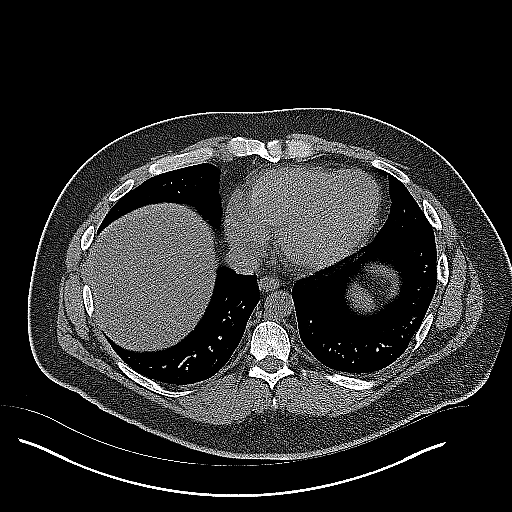
[im 60/75  soft-tissue]
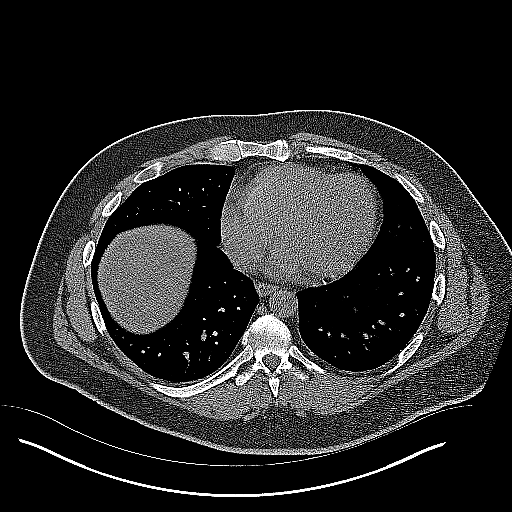
[im 65/75  soft-tissue]
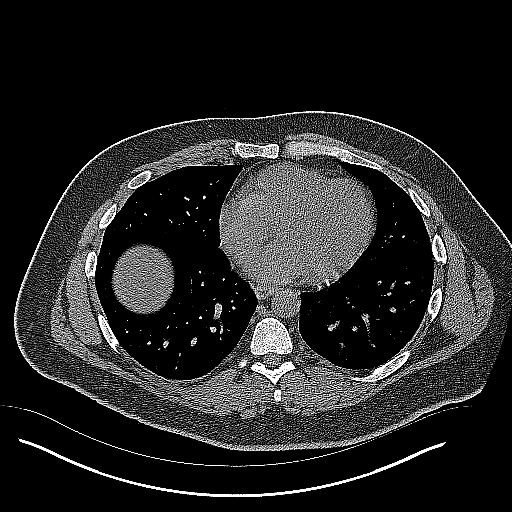
[im 70/75  soft-tissue]
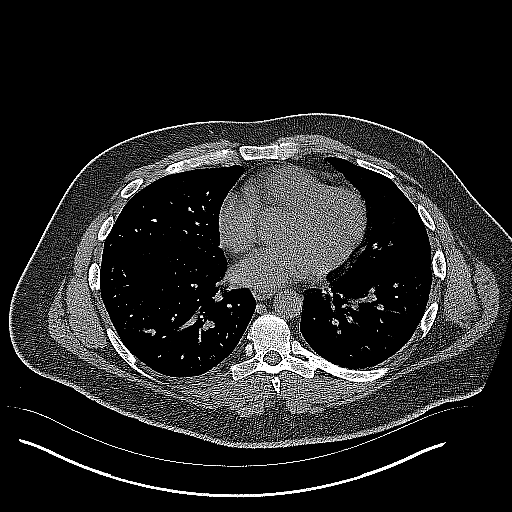

[16 of 46 positions shown; findings below may reference images not displayed]

FINDINGS: Lower chest: The visualized lung bases are grossly clear. The
visualized portions of the mediastinum are unremarkable.

Hepatobiliary: There is diffuse fatty infiltration within the liver.
The gallbladder is unremarkable in appearance. The common bile duct
remains normal in caliber.

Pancreas: The pancreas is within normal limits.

Spleen: The spleen is unremarkable in appearance.

Adrenals/Urinary Tract: The adrenal glands are unremarkable in
appearance.

Mild nonspecific perinephric stranding is noted bilaterally. There
is no evidence of hydronephrosis. No renal or ureteral stones are
seen.

Stomach/Bowel: The stomach is unremarkable in appearance. The small
bowel is within normal limits. The appendix is normal in caliber,
without evidence of appendicitis. The colon is unremarkable in
appearance.

Vascular/Lymphatic: Minimal calcification is seen along the right
common iliac artery. No significant calcific atherosclerotic disease
is seen. No retroperitoneal or pelvic sidewall lymphadenopathy is
appreciated. The inferior vena cava is unremarkable in appearance.

Reproductive: The bladder is mildly distended and grossly
unremarkable. The prostate remains normal in size. Scattered
calcification is seen within the prostate.

Other: No additional soft tissue abnormalities are seen.

Musculoskeletal: No acute osseous abnormalities are identified. The
visualized musculature is unremarkable in appearance.
IMPRESSION: 1. No acute abnormality seen within the abdomen or pelvis.
2. Diffuse fatty infiltration within the liver.

## 2019-04-20 ENCOUNTER — Other Ambulatory Visit: Payer: Self-pay | Admitting: Internal Medicine

## 2019-04-20 DIAGNOSIS — F900 Attention-deficit hyperactivity disorder, predominantly inattentive type: Secondary | ICD-10-CM

## 2019-04-20 NOTE — Telephone Encounter (Signed)
Routing to CMA 

## 2019-04-20 NOTE — Telephone Encounter (Signed)
Requested medication (s) are due for refill today: yes  Requested medication (s) are on the active medication list: yes  Last refill: 03/11/2019  Future visit scheduled: no  Notes to clinic:  Review for refill   Requested Prescriptions  Pending Prescriptions Disp Refills   amphetamine-dextroamphetamine (ADDERALL) 20 MG tablet 30 tablet 0    Sig: Take 1 tablet (20 mg total) by mouth daily.     Not Delegated - Psychiatry:  Stimulants/ADHD Failed - 04/20/2019 12:58 PM      Failed - This refill cannot be delegated      Failed - Urine Drug Screen completed in last 360 days.      Passed - Valid encounter within last 3 months    Recent Outpatient Visits          1 month ago Essential hypertension   [place], [place]   4 months ago OSA on CPAP   [place], Thomas L, MD   4 months ago Essential hypertension   [place], Thomas L, MD   1 year ago Need for influenza vaccination   [facility] -Mayer Camel, MD   1 year ago Attention deficit hyperactivity disorder (ADHD), predominantly inattentive type   [facility] -Mayer Camel, MD

## 2019-04-20 NOTE — Telephone Encounter (Signed)
Medication Refill - Medication: amphetamine-dextroamphetamine (ADDERALL) 20 MG tablet   Preferred Pharmacy:  [id number] [place], [place] [phone] (Phone) [phone] (Fax)     Pt was  advised that RX refills may take up to 3 business days. We ask that you follow-up with your pharmacy.

## 2019-04-21 MED ORDER — AMPHETAMINE-DEXTROAMPHETAMINE 20 MG PO TABS: 20.0000 mg | ORAL_TABLET | Freq: Every day | ORAL | 0 refills | Status: DC

## 2019-05-14 ENCOUNTER — Other Ambulatory Visit: Payer: Self-pay | Admitting: Internal Medicine

## 2019-05-14 DIAGNOSIS — I119 Hypertensive heart disease without heart failure: Secondary | ICD-10-CM

## 2019-05-14 DIAGNOSIS — F900 Attention-deficit hyperactivity disorder, predominantly inattentive type: Secondary | ICD-10-CM

## 2019-05-14 DIAGNOSIS — I10 Essential (primary) hypertension: Secondary | ICD-10-CM

## 2019-05-14 MED ORDER — TELMISARTAN-HCTZ 80-12.5 MG PO TABS: 1.0000 | ORAL_TABLET | Freq: Every day | ORAL | 1 refills | Status: DC

## 2019-05-14 NOTE — Telephone Encounter (Signed)
Copied from [place] [phone]. Topic: Quick Communication - Rx Refill/Question >> May 14, 2019  9:38 AM Leward Quan A wrote: Medication: amphetamine-dextroamphetamine (ADDERALL) 20 MG tablet, telmisartan-hydrochlorothiazide (MICARDIS HCT) 80-12.5 MG tablet   Has the patient contacted their pharmacy? Yes.   (Agent: If no, request that the patient contact the pharmacy for the refill.) (Agent: If yes, when and what did the pharmacy advise?)  Preferred Pharmacy (with phone number or street name): [id number] [place], [place] - [place] [phone] (Phone) [phone] (Fax)    Agent: Please be advised that RX refills may take up to 3 business days. We ask that you follow-up with your pharmacy.

## 2019-05-14 NOTE — Telephone Encounter (Signed)
Requested medication (s) are due for refill today: yes  Requested medication (s) are on the active medication list: yes  Last refill:  04/20/2019  Future visit scheduled: no  Notes to clinic:  Refill cannot be delegated   Requested Prescriptions  Pending Prescriptions Disp Refills   amphetamine-dextroamphetamine (ADDERALL) 20 MG tablet 30 tablet 0    Sig: Take 1 tablet (20 mg total) by mouth daily.     Not Delegated - Psychiatry:  Stimulants/ADHD Failed - 05/14/2019  9:46 AM      Failed - This refill cannot be delegated      Failed - Urine Drug Screen completed in last 360 days.      Passed - Valid encounter within last 3 months    Recent Outpatient Visits          2 months ago Essential hypertension   [facility] -Madelin Rear, MD   5 months ago OSA on CPAP   [facility] -Madelin Rear, MD   5 months ago Essential hypertension   [facility] -Madelin Rear, MD   1 year ago Need for influenza vaccination   [facility] -Madelin Rear, MD   1 year ago Attention deficit hyperactivity disorder (ADHD), predominantly inattentive type   [facility] -Madelin Rear, MD             Signed Prescriptions Disp Refills   telmisartan-hydrochlorothiazide (MICARDIS HCT) 80-12.5 MG tablet 90 tablet 1    Sig: Take 1 tablet by mouth daily.     Cardiovascular: ARB + Diuretic Combos Failed - 05/14/2019  9:46 AM      Failed - K in normal range and within 180 days    Potassium  Date Value Ref Range Status  03/11/2019 3.4 (L) 3.5 - 5.1 mEq/L Final         Passed - Na in normal range and within 180 days    Sodium  Date Value Ref Range Status  03/11/2019 140 135 - 145 mEq/L Final  09/08/2017 140 134 - 144 mmol/L Final         Passed - Cr in normal range and within 180 days    Creatinine, Ser  Date Value Ref Range Status  03/11/2019 1.15 0.40 - 1.50  mg/dL Final         Passed - Ca in normal range and within 180 days    Calcium  Date Value Ref Range Status  03/11/2019 8.8 8.4 - 10.5 mg/dL Final         Passed - Patient is not pregnant      Passed - Last BP in normal range    BP Readings from Last 1 Encounters:  03/11/19 128/86         Passed - Valid encounter within last 6 months    Recent Outpatient Visits          2 months ago Essential hypertension   [facility] -Madelin Rear, MD   5 months ago OSA on CPAP   [facility] -Madelin Rear, MD   5 months ago Essential hypertension   [facility] -Madelin Rear, MD   1 year ago Need for influenza vaccination   [facility] -Madelin Rear, MD   1 year ago Attention deficit hyperactivity disorder (ADHD), predominantly inattentive type  [facility] -Mayer Camel, MD

## 2019-05-18 DIAGNOSIS — M546 Pain in thoracic spine: Secondary | ICD-10-CM | POA: Diagnosis not present

## 2019-05-18 DIAGNOSIS — M545 Low back pain: Secondary | ICD-10-CM | POA: Diagnosis not present

## 2019-05-18 DIAGNOSIS — M542 Cervicalgia: Secondary | ICD-10-CM | POA: Diagnosis not present

## 2019-05-18 DIAGNOSIS — M9903 Segmental and somatic dysfunction of lumbar region: Secondary | ICD-10-CM | POA: Diagnosis not present

## 2019-05-18 MED ORDER — AMPHETAMINE-DEXTROAMPHETAMINE 20 MG PO TABS: 20.0000 mg | ORAL_TABLET | Freq: Every day | ORAL | 0 refills | Status: DC

## 2019-05-18 NOTE — Telephone Encounter (Signed)
Done erx 

## 2019-05-18 NOTE — Telephone Encounter (Signed)
Check [place] registry last filled 04/21/2019. MD is out of the office this week. Pls advise.Marland KitchenJohny Little

## 2019-06-14 ENCOUNTER — Other Ambulatory Visit: Payer: Self-pay | Admitting: Internal Medicine

## 2019-06-14 ENCOUNTER — Telehealth: Payer: Self-pay | Admitting: Internal Medicine

## 2019-06-14 DIAGNOSIS — R7303 Prediabetes: Secondary | ICD-10-CM

## 2019-06-14 DIAGNOSIS — F900 Attention-deficit hyperactivity disorder, predominantly inattentive type: Secondary | ICD-10-CM

## 2019-06-14 DIAGNOSIS — I119 Hypertensive heart disease without heart failure: Secondary | ICD-10-CM

## 2019-06-14 DIAGNOSIS — I10 Essential (primary) hypertension: Secondary | ICD-10-CM

## 2019-06-14 MED ORDER — TELMISARTAN-HCTZ 80-12.5 MG PO TABS: 1.0000 | ORAL_TABLET | Freq: Every day | ORAL | 1 refills | Status: DC

## 2019-06-14 MED ORDER — METFORMIN HCL 500 MG PO TABS: 500.0000 mg | ORAL_TABLET | Freq: Two times a day (BID) | ORAL | 1 refills | Status: DC

## 2019-06-14 NOTE — Telephone Encounter (Signed)
Requested medication (s) are due for refill today: yes  Requested medication (s) are on the active medication list: yes  Last refill:  05/14/2019  Future visit scheduled:no  Notes to clinic:  Refill cannot be delegated    Requested Prescriptions  Pending Prescriptions Disp Refills   amphetamine-dextroamphetamine (ADDERALL) 20 MG tablet 30 tablet 0    Sig: Take 1 tablet (20 mg total) by mouth daily.     Not Delegated - Psychiatry:  Stimulants/ADHD Failed - 06/14/2019 10:04 AM      Failed - This refill cannot be delegated      Failed - Urine Drug Screen completed in last 360 days.      Failed - Valid encounter within last 3 months    Recent Outpatient Visits          3 months ago Essential hypertension   [place], Thomas L, MD   6 months ago OSA on CPAP   [place], Thomas L, MD   6 months ago Essential hypertension   [place], Thomas L, MD   1 year ago Need for influenza vaccination   [place], Thomas L, MD   1 year ago Attention deficit hyperactivity disorder (ADHD), predominantly inattentive type   [facility] -Mayer Camel, MD             Signed Prescriptions Disp Refills   metFORMIN (GLUCOPHAGE) 500 MG tablet 180 tablet 1    Sig: Take 1 tablet (500 mg total) by mouth 2 (two) times daily with a meal.     Endocrinology:  Diabetes - Biguanides Passed - 06/14/2019 10:04 AM      Passed - Cr in normal range and within 360 days    Creatinine, Ser  Date Value Ref Range Status  03/11/2019 1.15 0.40 - 1.50 mg/dL Final         Passed - HBA1C is between 0 and 7.9 and within 180 days    Hemoglobin A1C  Date Value Ref Range Status  03/11/2019 5.7 (A) 4.0 - 5.6 % Final   Hgb A1c MFr Bld  Date Value Ref Range Status  02/04/2018 5.3 4.8 - 5.6 % Final    Comment:             Prediabetes: 5.7 - 6.4           Diabetes: >6.4          Glycemic control for adults with diabetes: <7.0          Passed - eGFR in normal range and within 360 days    GFR calc Af Amer  Date Value Ref Range Status  12/17/2018 >60 >60 mL/min Final   GFR calc non Af Amer  Date Value Ref Range Status  12/17/2018 >60 >60 mL/min Final   GFR  Date Value Ref Range Status  03/11/2019 72.36 >60.00 mL/min Final         Passed - Valid encounter within last 6 months    Recent Outpatient Visits          3 months ago Essential hypertension   [facility] -Mayer Camel, MD   6 months ago OSA on CPAP   [facility] -Mayer Camel, MD   6 months ago Essential hypertension   [facility] -Mayer Camel, MD   1 year ago  Need for influenza vaccination   [facility] -Mayer Camel, MD   1 year ago Attention deficit hyperactivity disorder (ADHD), predominantly inattentive type   [facility] -Mayer Camel, MD              telmisartan-hydrochlorothiazide (MICARDIS HCT) 80-12.5 MG tablet 90 tablet 1    Sig: Take 1 tablet by mouth daily.     Cardiovascular: ARB + Diuretic Combos Failed - 06/14/2019 10:04 AM      Failed - K in normal range and within 180 days    Potassium  Date Value Ref Range Status  03/11/2019 3.4 (L) 3.5 - 5.1 mEq/L Final         Passed - Na in normal range and within 180 days    Sodium  Date Value Ref Range Status  03/11/2019 140 135 - 145 mEq/L Final  09/08/2017 140 134 - 144 mmol/L Final         Passed - Cr in normal range and within 180 days    Creatinine, Ser  Date Value Ref Range Status  03/11/2019 1.15 0.40 - 1.50 mg/dL Final         Passed - Ca in normal range and within 180 days    Calcium  Date Value Ref Range Status  03/11/2019 8.8 8.4 - 10.5 mg/dL Final         Passed - Patient is not pregnant      Passed - Last BP in normal range    BP  Readings from Last 1 Encounters:  03/11/19 128/86         Passed - Valid encounter within last 6 months    Recent Outpatient Visits          3 months ago Essential hypertension   [place], Thomas L, MD   6 months ago OSA on CPAP   [place], Thomas L, MD   6 months ago Essential hypertension   [facility] -Mayer Camel, MD   1 year ago Need for influenza vaccination   [facility] -Mayer Camel, MD   1 year ago Attention deficit hyperactivity disorder (ADHD), predominantly inattentive type   [facility] -Mayer Camel, MD

## 2019-06-14 NOTE — Telephone Encounter (Signed)
Patient is wanting to know if he needs to get a Pneumonia shot. His insurance runs out at the end of the year. He is trying to get everything handled before then.

## 2019-06-14 NOTE — Telephone Encounter (Signed)
Called patient and informed

## 2019-06-14 NOTE — Telephone Encounter (Signed)
Medication Refill - Medication: amphetamine-dextroamphetamine (ADDERALL) 20 MG tablet, metFORMIN (GLUCOPHAGE) 500 MG tablet, telmisartan-hydrochlorothiazide (MICARDIS HCT) 80-12.5 MG tablet  Has the patient contacted their pharmacy? Yes.   (Agent: If no, request that the patient contact the pharmacy for the refill.) (Agent: If yes, when and what did the pharmacy advise?)  Preferred Pharmacy (with phone number or street name):  [id number] [place], [place] [phone] (Phone) [phone] (Fax)     Agent: Please be advised that RX refills may take up to 3 business days. We ask that you follow-up with your pharmacy.

## 2019-06-14 NOTE — Telephone Encounter (Signed)
Pt is not due for any vaccines.  Can you let pt know?

## 2019-06-15 MED ORDER — AMPHETAMINE-DEXTROAMPHETAMINE 20 MG PO TABS: 20.0000 mg | ORAL_TABLET | Freq: Every day | ORAL | 0 refills | Status: DC

## 2019-06-17 DIAGNOSIS — M545 Low back pain: Secondary | ICD-10-CM | POA: Diagnosis not present

## 2019-06-17 DIAGNOSIS — M9903 Segmental and somatic dysfunction of lumbar region: Secondary | ICD-10-CM | POA: Diagnosis not present

## 2019-06-17 DIAGNOSIS — M542 Cervicalgia: Secondary | ICD-10-CM | POA: Diagnosis not present

## 2019-06-17 DIAGNOSIS — M546 Pain in thoracic spine: Secondary | ICD-10-CM | POA: Diagnosis not present

## 2019-06-21 DIAGNOSIS — M546 Pain in thoracic spine: Secondary | ICD-10-CM | POA: Diagnosis not present

## 2019-06-21 DIAGNOSIS — M545 Low back pain: Secondary | ICD-10-CM | POA: Diagnosis not present

## 2019-06-21 DIAGNOSIS — M9903 Segmental and somatic dysfunction of lumbar region: Secondary | ICD-10-CM | POA: Diagnosis not present

## 2019-06-21 DIAGNOSIS — M542 Cervicalgia: Secondary | ICD-10-CM | POA: Diagnosis not present

## 2019-06-22 ENCOUNTER — Encounter: Payer: Self-pay | Admitting: Internal Medicine

## 2019-06-22 ENCOUNTER — Other Ambulatory Visit: Payer: Self-pay

## 2019-06-22 ENCOUNTER — Other Ambulatory Visit (INDEPENDENT_AMBULATORY_CARE_PROVIDER_SITE_OTHER): Payer: BC Managed Care – PPO

## 2019-06-22 ENCOUNTER — Ambulatory Visit (INDEPENDENT_AMBULATORY_CARE_PROVIDER_SITE_OTHER): Payer: BC Managed Care – PPO | Admitting: Internal Medicine

## 2019-06-22 VITALS — BP 120/80 | HR 70 | Temp 97.8°F | Resp 16 | Ht 70.0 in | Wt 270.0 lb

## 2019-06-22 DIAGNOSIS — E559 Vitamin D deficiency, unspecified: Secondary | ICD-10-CM | POA: Diagnosis not present

## 2019-06-22 DIAGNOSIS — Z6837 Body mass index (BMI) 37.0-37.9, adult: Secondary | ICD-10-CM

## 2019-06-22 DIAGNOSIS — I10 Essential (primary) hypertension: Secondary | ICD-10-CM

## 2019-06-22 DIAGNOSIS — N522 Drug-induced erectile dysfunction: Secondary | ICD-10-CM | POA: Diagnosis not present

## 2019-06-22 LAB — BASIC METABOLIC PANEL
BUN: 12 mg/dL (ref 6–23)
CO2: 28 mEq/L (ref 19–32)
Calcium: 9.6 mg/dL (ref 8.4–10.5)
Chloride: 105 mEq/L (ref 96–112)
Creatinine, Ser: 1.02 mg/dL (ref 0.40–1.50)
GFR: 82.97 mL/min (ref >60.00)
Glucose, Bld: 106 mg/dL — ABNORMAL HIGH (ref 70–99)
Potassium: 3.5 mEq/L (ref 3.5–5.1)
Sodium: 139 mEq/L (ref 135–145)

## 2019-06-22 LAB — VITAMIN D 25 HYDROXY (VIT D DEFICIENCY, FRACTURES): VITD: 33.6 ng/mL (ref 30.00–100.00)

## 2019-06-22 MED ORDER — PHENTERMINE HCL 37.5 MG PO TABS: 37.5000 mg | ORAL_TABLET | Freq: Every day | ORAL | 2 refills | Status: DC

## 2019-06-22 MED ORDER — SILDENAFIL CITRATE 20 MG PO TABS: 80.0000 mg | ORAL_TABLET | Freq: Every day | ORAL | 5 refills | Status: DC | PRN

## 2019-06-22 NOTE — Progress Notes (Signed)
Subjective:  Patient ID: Cory Little, male    DOB: 1985/09/01  Age: 33 y.o. MRN: [id number]  CC: Hypertension   HPI JAQUAWN SAFFRAN presents for f/up - He complains of an inability to lose weight and wants to try an appetite suppressant.  He tells me he saw a bariatric doctor years ago and did well on an oral medication.  He tells me his blood pressure has been well controlled.  He denies headache, blurred vision, chest pain, shortness of breath.  He complains of erectile dysfunction and wants a prescription for Viagra.  Pt states ADD status overall stable on current meds with overall good compliance and tolerability, and good effectiveness with respect to ability for concentration and task completion.  Outpatient Medications Prior to Visit  Medication Sig Dispense Refill  . amphetamine-dextroamphetamine (ADDERALL) 20 MG tablet Take 1 tablet (20 mg total) by mouth daily. 30 tablet 0  . Cholecalciferol 50 MCG (2000 UT) TABS Take 1 tablet (2,000 Units total) by mouth daily. 90 tablet 1  . metFORMIN (GLUCOPHAGE) 500 MG tablet Take 1 tablet (500 mg total) by mouth 2 (two) times daily with a meal. 180 tablet 1  . potassium chloride SA (K-DUR) 20 MEQ tablet Take 1 tablet (20 mEq total) by mouth 2 (two) times daily. 180 tablet 1  . telmisartan-hydrochlorothiazide (MICARDIS HCT) 80-12.5 MG tablet Take 1 tablet by mouth daily. 90 tablet 1   No facility-administered medications prior to visit.     ROS Review of Systems  Constitutional: Negative for diaphoresis, fatigue and unexpected weight change.  HENT: Negative.   Eyes: Negative for visual disturbance.  Respiratory: Negative for cough, chest tightness, shortness of breath and wheezing.   Cardiovascular: Negative for chest pain, palpitations and leg swelling.  Gastrointestinal: Negative for abdominal pain.  Endocrine: Negative.   Genitourinary: Negative.  Negative for difficulty urinating, scrotal swelling and testicular pain.   +ED  Musculoskeletal: Negative.   Neurological: Negative.  Negative for dizziness, weakness and headaches.  Hematological: Negative for adenopathy. Does not bruise/bleed easily.  Psychiatric/Behavioral: Positive for decreased concentration. Negative for agitation, behavioral problems, dysphoric mood and sleep disturbance. The patient is not nervous/anxious.     Objective:  BP 120/80 (BP Location: Left Arm, Patient Position: Sitting, Cuff Size: Large)   Pulse 70   Temp 97.8 F (36.6 C) (Oral)   Ht 5\' 10"  (1.778 m)   Wt 270 lb (122.5 kg)   SpO2 97%   BMI 38.74 kg/m   BP Readings from Last 3 Encounters:  06/22/19 120/80  03/11/19 128/86  12/17/18 (!) 142/102    Wt Readings from Last 3 Encounters:  06/22/19 270 lb (122.5 kg)  03/11/19 269 lb (122 kg)  12/17/18 270 lb (122.5 kg)    Physical Exam Vitals signs reviewed.  Constitutional:      Appearance: He is obese.  HENT:     Nose: Nose normal.     Mouth/Throat:     Mouth: Mucous membranes are moist.  Eyes:     General: No scleral icterus.    Conjunctiva/sclera: Conjunctivae normal.  Neck:     Musculoskeletal: Neck supple.  Cardiovascular:     Rate and Rhythm: Normal rate and regular rhythm.     Heart sounds: No murmur.  Pulmonary:     Effort: Pulmonary effort is normal.     Breath sounds: No stridor. No wheezing, rhonchi or rales.  Abdominal:     General: Abdomen is protuberant. Bowel sounds are normal. There is  no distension.     Palpations: There is no hepatomegaly or splenomegaly.     Tenderness: There is no abdominal tenderness.  Musculoskeletal: Normal range of motion.     Right lower leg: No edema.     Left lower leg: No edema.  Lymphadenopathy:     Cervical: No cervical adenopathy.  Skin:    General: Skin is warm and dry.  Neurological:     General: No focal deficit present.     Mental Status: He is alert and oriented to person, place, and time.  Psychiatric:        Mood and Affect: Mood normal.         Behavior: Behavior normal.        Thought Content: Thought content normal.        Judgment: Judgment normal.     Lab Results  Component Value Date   WBC 9.3 03/11/2019   HGB 14.4 03/11/2019   HCT 41.1 03/11/2019   PLT 277.0 03/11/2019   GLUCOSE 106 (H) 06/22/2019   CHOL 200 03/11/2019   TRIG 139.0 03/11/2019   HDL 37.80 (L) 03/11/2019   LDLDIRECT 155.0 04/16/2017   LDLCALC 134 (H) 03/11/2019   ALT 29 03/11/2019   AST 13 03/11/2019   NA 139 06/22/2019   K 3.5 06/22/2019   CL 105 06/22/2019   CREATININE 1.02 06/22/2019   BUN 12 06/22/2019   CO2 28 06/22/2019   TSH 0.93 03/11/2019   HGBA1C 5.7 (A) 03/11/2019    Ct Renal Stone Study  Result Date: 12/17/2018 CLINICAL DATA:  Left flank pain.  History of kidney stones. EXAM: CT ABDOMEN AND PELVIS WITHOUT CONTRAST TECHNIQUE: Multidetector CT imaging of the abdomen and pelvis was performed following the standard protocol without IV contrast. COMPARISON:  12/21/2016 FINDINGS: LOWER CHEST: There is no basilar pleural or apical pericardial effusion. HEPATOBILIARY: Diffusely nodular hepatic contours with relative hypertrophy of the caudate and left hepatic lobe, consistent with hepatic cirrhosis. No focal liver lesion. No biliary dilatation. The gallbladder is normal. PANCREAS: The pancreatic parenchymal contours are normal and there is no ductal dilatation. There is no peripancreatic fluid collection. SPLEEN: Normal. ADRENALS/URINARY TRACT: --Adrenal glands: Normal. --Right kidney/ureter: No hydronephrosis, nephroureterolithiasis, perinephric stranding or solid renal mass. --Left kidney/ureter: No hydronephrosis, nephroureterolithiasis, perinephric stranding or solid renal mass. --Urinary bladder: Normal for degree of distention STOMACH/BOWEL: --Stomach/Duodenum: There is no hiatal hernia or other gastric abnormality. The duodenal course and caliber are normal. --Small bowel: No dilatation or inflammation. --Colon: No focal abnormality.  --Appendix: Normal. VASCULAR/LYMPHATIC: Normal course and caliber of the major abdominal vessels. No abdominal or pelvic lymphadenopathy. REPRODUCTIVE: No free fluid in the pelvis. MUSCULOSKELETAL. No bony spinal canal stenosis or focal osseous abnormality. OTHER: None. IMPRESSION: 1. No obstructive uropathy or nephrolithiasis. 2. Hepatic steatosis. Electronically Signed   By: Ulyses Jarred M.D.   On: 12/17/2018 20:37    Assessment & Plan:   Ayvin was seen today for hypertension.  Diagnoses and all orders for this visit:  Essential hypertension- His blood pressure is adequately well controlled.  Electrolytes and renal function are normal. -     Basic metabolic panel; Future  Vitamin D deficiency- His vitamin D level is in the low normal range.  I have asked him to continue the current vitamin D supplement. -     Vitamin D 25 hydroxy; Future  Class 2 severe obesity due to excess calories with serious comorbidity and body mass index (BMI) of 37.0 to 37.9 in adult [facility])-  In addition to lifestyle modifications he will start taking phentermine to help reduce his caloric intake. -     phentermine (ADIPEX-P) 37.5 MG tablet; Take 1 tablet (37.5 mg total) by mouth daily before breakfast.  Drug-induced erectile dysfunction -     sildenafil (REVATIO) 20 MG tablet; Take 4 tablets (80 mg total) by mouth daily as needed.   I am having Taige L. Bouza start on phentermine and sildenafil. I am also having him maintain his potassium chloride SA, Cholecalciferol, amphetamine-dextroamphetamine, metFORMIN, and telmisartan-hydrochlorothiazide.  Meds ordered this encounter  Medications  . phentermine (ADIPEX-P) 37.5 MG tablet    Sig: Take 1 tablet (37.5 mg total) by mouth daily before breakfast.    Dispense:  30 tablet    Refill:  2  . sildenafil (REVATIO) 20 MG tablet    Sig: Take 4 tablets (80 mg total) by mouth daily as needed.    Dispense:  60 tablet    Refill:  5     Follow-up: Return in  about 6 months (around 12/20/2019).  Sanda Lingerhomas Desira Alessandrini, MD

## 2019-06-22 NOTE — Patient Instructions (Signed)

## 2019-06-23 ENCOUNTER — Encounter: Payer: Self-pay | Admitting: Internal Medicine

## 2019-06-23 DIAGNOSIS — M9903 Segmental and somatic dysfunction of lumbar region: Secondary | ICD-10-CM | POA: Diagnosis not present

## 2019-06-23 DIAGNOSIS — M545 Low back pain: Secondary | ICD-10-CM | POA: Diagnosis not present

## 2019-06-23 DIAGNOSIS — M542 Cervicalgia: Secondary | ICD-10-CM | POA: Diagnosis not present

## 2019-06-23 DIAGNOSIS — M546 Pain in thoracic spine: Secondary | ICD-10-CM | POA: Diagnosis not present

## 2019-06-28 DIAGNOSIS — M9903 Segmental and somatic dysfunction of lumbar region: Secondary | ICD-10-CM | POA: Diagnosis not present

## 2019-06-28 DIAGNOSIS — M542 Cervicalgia: Secondary | ICD-10-CM | POA: Diagnosis not present

## 2019-06-28 DIAGNOSIS — M545 Low back pain: Secondary | ICD-10-CM | POA: Diagnosis not present

## 2019-06-28 DIAGNOSIS — M546 Pain in thoracic spine: Secondary | ICD-10-CM | POA: Diagnosis not present

## 2019-07-21 ENCOUNTER — Other Ambulatory Visit: Payer: Self-pay | Admitting: Internal Medicine

## 2019-07-21 DIAGNOSIS — F900 Attention-deficit hyperactivity disorder, predominantly inattentive type: Secondary | ICD-10-CM

## 2019-07-21 MED ORDER — AMPHETAMINE-DEXTROAMPHETAMINE 20 MG PO TABS: 20.0000 mg | ORAL_TABLET | Freq: Every day | ORAL | 0 refills | Status: DC

## 2019-07-21 NOTE — Telephone Encounter (Signed)
Requested medication (s) are due for refill today: yes  Requested medication (s) are on the active medication list: yes  Last refill:  06/15/2019  Future visit scheduled: no  Notes to clinic:  refill cannot be delegated    Requested Prescriptions  Pending Prescriptions Disp Refills   amphetamine-dextroamphetamine (ADDERALL) 20 MG tablet 30 tablet 0    Sig: Take 1 tablet (20 mg total) by mouth daily.      Not Delegated - Psychiatry:  Stimulants/ADHD Failed - 07/21/2019  8:51 AM      Failed - This refill cannot be delegated      Failed - Urine Drug Screen completed in last 360 days.      Passed - Valid encounter within last 3 months    Recent Outpatient Visits           4 weeks ago Essential hypertension   [place], Thomas L, MD   4 months ago Essential hypertension   [place], Thomas L, MD   7 months ago OSA on CPAP   [place], Thomas L, MD   7 months ago Essential hypertension   [facility] -Mayer Camel, MD   1 year ago Need for influenza vaccination   [facility] -Mayer Camel, MD

## 2019-07-21 NOTE — Telephone Encounter (Signed)
Medication Refill - Medication: amphetamine-dextroamphetamine (ADDERALL) 20 MG tablet  Has the patient contacted their pharmacy? Yes.   (Agent: If no, request that the patient contact the pharmacy for the refill.) (Agent: If yes, when and what did the pharmacy advise?)  Preferred Pharmacy (with phone number or street name[id number] [place], [place]: Please be advised that RX refills may take up to 3 business days. We ask that you follow-up with your pharmacy.

## 2019-07-27 DIAGNOSIS — E669 Obesity, unspecified: Secondary | ICD-10-CM | POA: Diagnosis not present

## 2019-07-27 DIAGNOSIS — I159 Secondary hypertension, unspecified: Secondary | ICD-10-CM | POA: Diagnosis not present

## 2019-07-27 DIAGNOSIS — G4733 Obstructive sleep apnea (adult) (pediatric): Secondary | ICD-10-CM | POA: Diagnosis not present

## 2019-09-10 ENCOUNTER — Telehealth: Payer: Self-pay | Admitting: Internal Medicine

## 2019-09-10 NOTE — Telephone Encounter (Signed)
Patient is requesting adderall and metformin to be sent to [facility]  in [place] on [address].

## 2019-09-11 ENCOUNTER — Other Ambulatory Visit: Payer: Self-pay | Admitting: Internal Medicine

## 2019-09-11 DIAGNOSIS — F900 Attention-deficit hyperactivity disorder, predominantly inattentive type: Secondary | ICD-10-CM

## 2019-09-11 DIAGNOSIS — R7303 Prediabetes: Secondary | ICD-10-CM

## 2019-09-11 MED ORDER — AMPHETAMINE-DEXTROAMPHETAMINE 20 MG PO TABS: 20.0000 mg | ORAL_TABLET | Freq: Every day | ORAL | 0 refills | Status: DC

## 2019-09-11 MED ORDER — METFORMIN HCL 500 MG PO TABS: 500.0000 mg | ORAL_TABLET | Freq: Two times a day (BID) | ORAL | 1 refills | Status: DC

## 2019-10-08 ENCOUNTER — Telehealth: Payer: Self-pay | Admitting: Internal Medicine

## 2019-10-08 NOTE — Telephone Encounter (Signed)
New message:   1.Medication Requested: amphetamine-dextroamphetamine (ADDERALL) 20 MG tablet 2. Pharmacy (Name, Street, [place]): [phone] [place] [place] - [address] [facility] 3. On Med List: yes  4. Last Visit with PCP: 06/22/19  5. Next visit date with PCP: None scheduled   Agent: Please be advised that RX refills may take up to 3 business days. We ask that you follow-up with your pharmacy.

## 2019-10-11 NOTE — Telephone Encounter (Signed)
New message:   I called patient to inform he his in need for a f/u appt before this medication can be refilled. He states he no longer has insurance due to being layed off. Please advise.

## 2019-10-12 NOTE — Telephone Encounter (Signed)
FYI - pt stated that he does not have insurance at the moment.

## 2019-12-23 ENCOUNTER — Other Ambulatory Visit: Payer: Self-pay | Admitting: Internal Medicine

## 2019-12-23 DIAGNOSIS — I10 Essential (primary) hypertension: Secondary | ICD-10-CM

## 2019-12-23 DIAGNOSIS — I119 Hypertensive heart disease without heart failure: Secondary | ICD-10-CM

## 2020-01-21 ENCOUNTER — Telehealth: Payer: Self-pay

## 2020-01-21 NOTE — Telephone Encounter (Signed)
1.Medication Requested:telmisartan-hydrochlorothiazide (MICARDIS HCT) 80-12.5 MG tablet & metFORMIN (GLUCOPHAGE) 500 MG tablet - asking for  90 days supply with 3 refill   amphetamine-dextroamphetamine (ADDERALL) 20 MG tablet  2. Pharmacy (Name, Street, City):[id number] - [address] [place], [place] - [address] [place]  3. On Med List: Yes   4. Last Visit with PCP: 11.24.20   5. Next visit date with PCP: n/a   Agent: Please be advised that RX refills may take up to 3 business days. We ask that you follow-up with your pharmacy.

## 2020-01-26 ENCOUNTER — Ambulatory Visit: Payer: BC Managed Care – PPO | Admitting: Internal Medicine

## 2020-04-10 ENCOUNTER — Telehealth: Payer: Self-pay | Admitting: Internal Medicine

## 2020-04-10 NOTE — Telephone Encounter (Signed)
Copied from CRM [id number]. Topic: General - Other >> Apr 10, 2020  4:04 PM Elliot Gault wrote: Reason for CRM: patient has no insurance and would to fill out an application for the orange card and would like to establish care, please advise

## 2020-04-11 NOTE — Telephone Encounter (Signed)
Pt was call and inform him that he need to apply by mail since he is going to [facility] and I don't do the financial for this location also explain him what he need to provide with the application to make sure he send all the necessary documentation he need to qualified

## 2020-05-06 IMAGING — CR DG CHEST 2V
2 series · 2 of 2 positions shown · non-contrast
Comparison: 12/21/2016

CLINICAL DATA: Morbid obesity.  Hypertension.  Former smoker.

EXAM:
CHEST - 2 VIEW

[w chest pa]
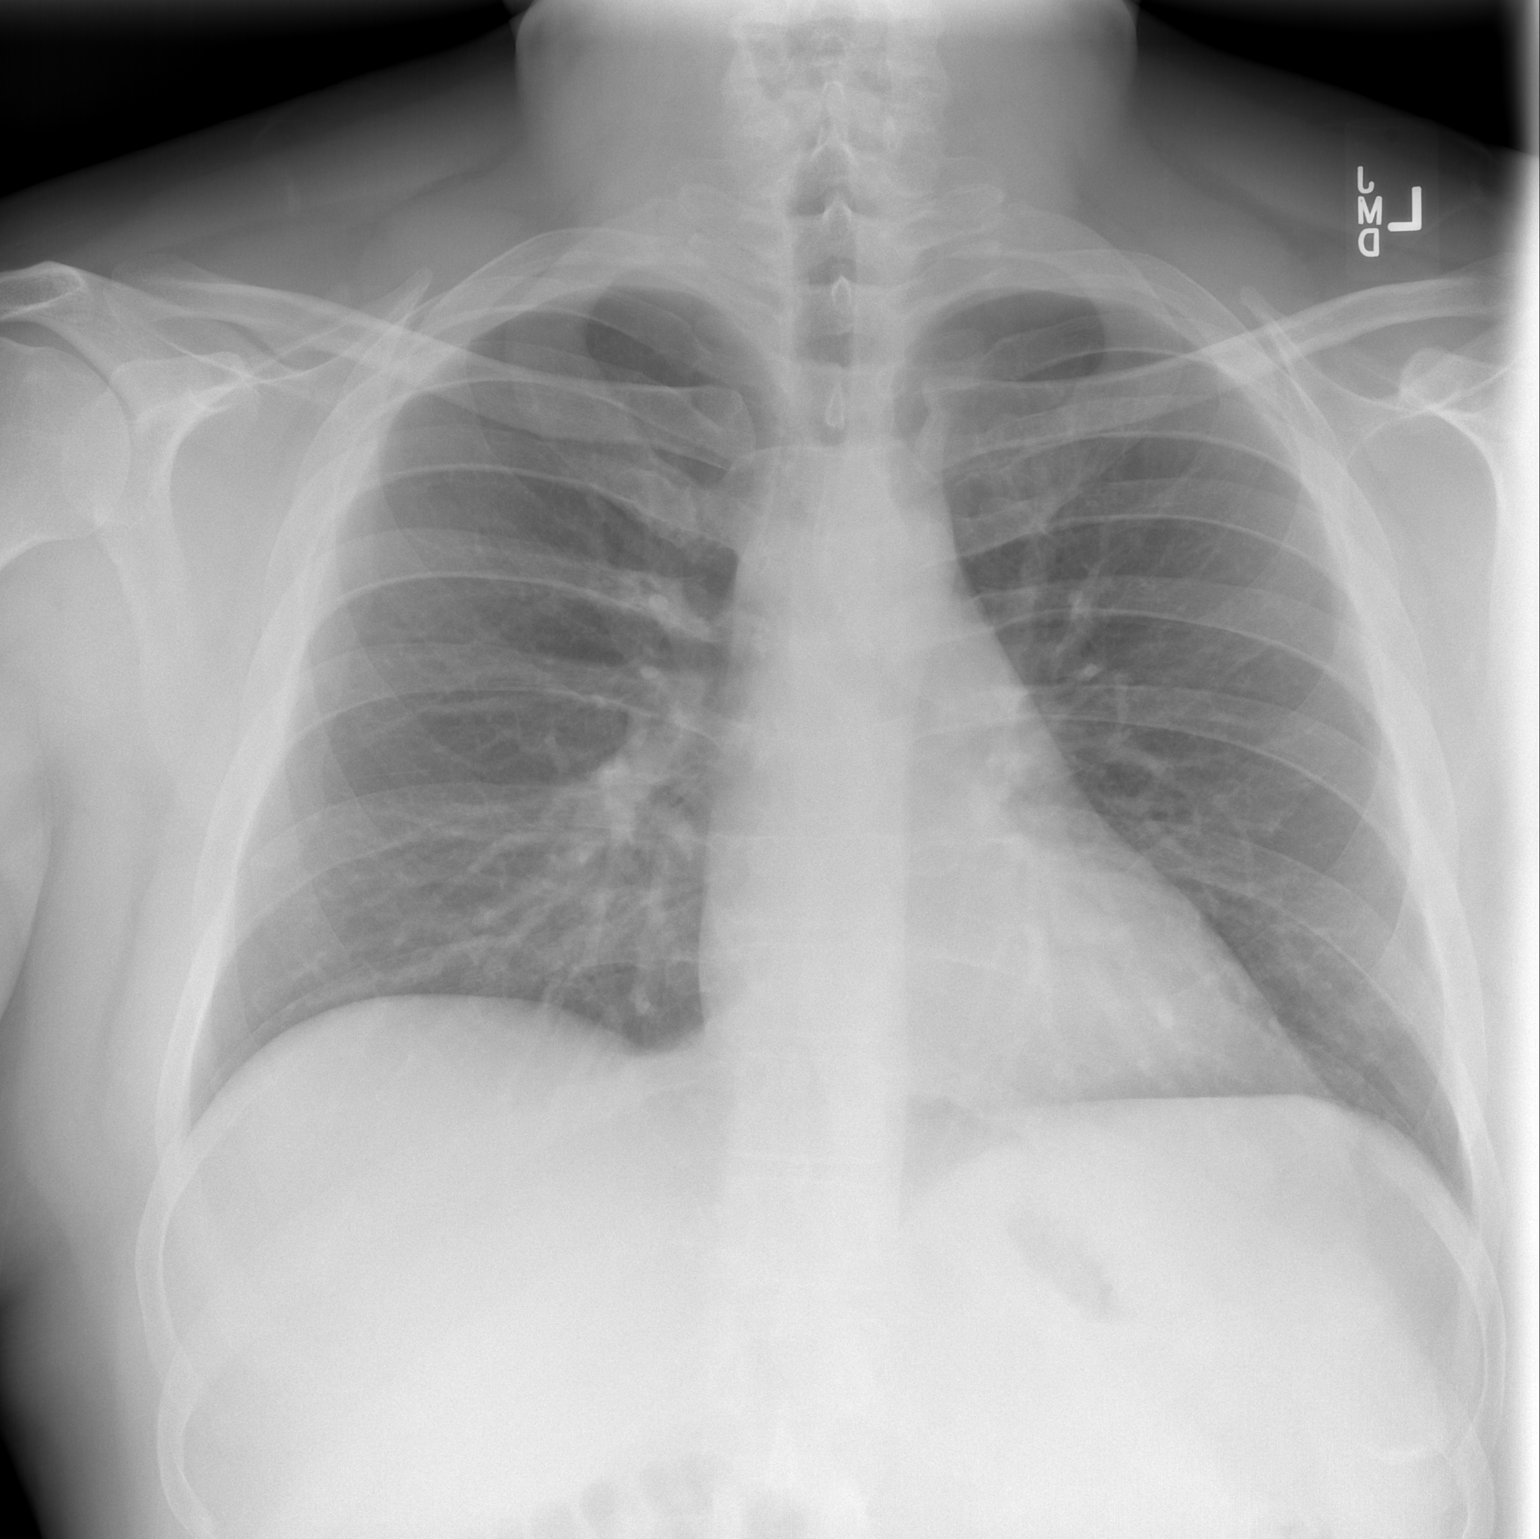

[w chest lat]
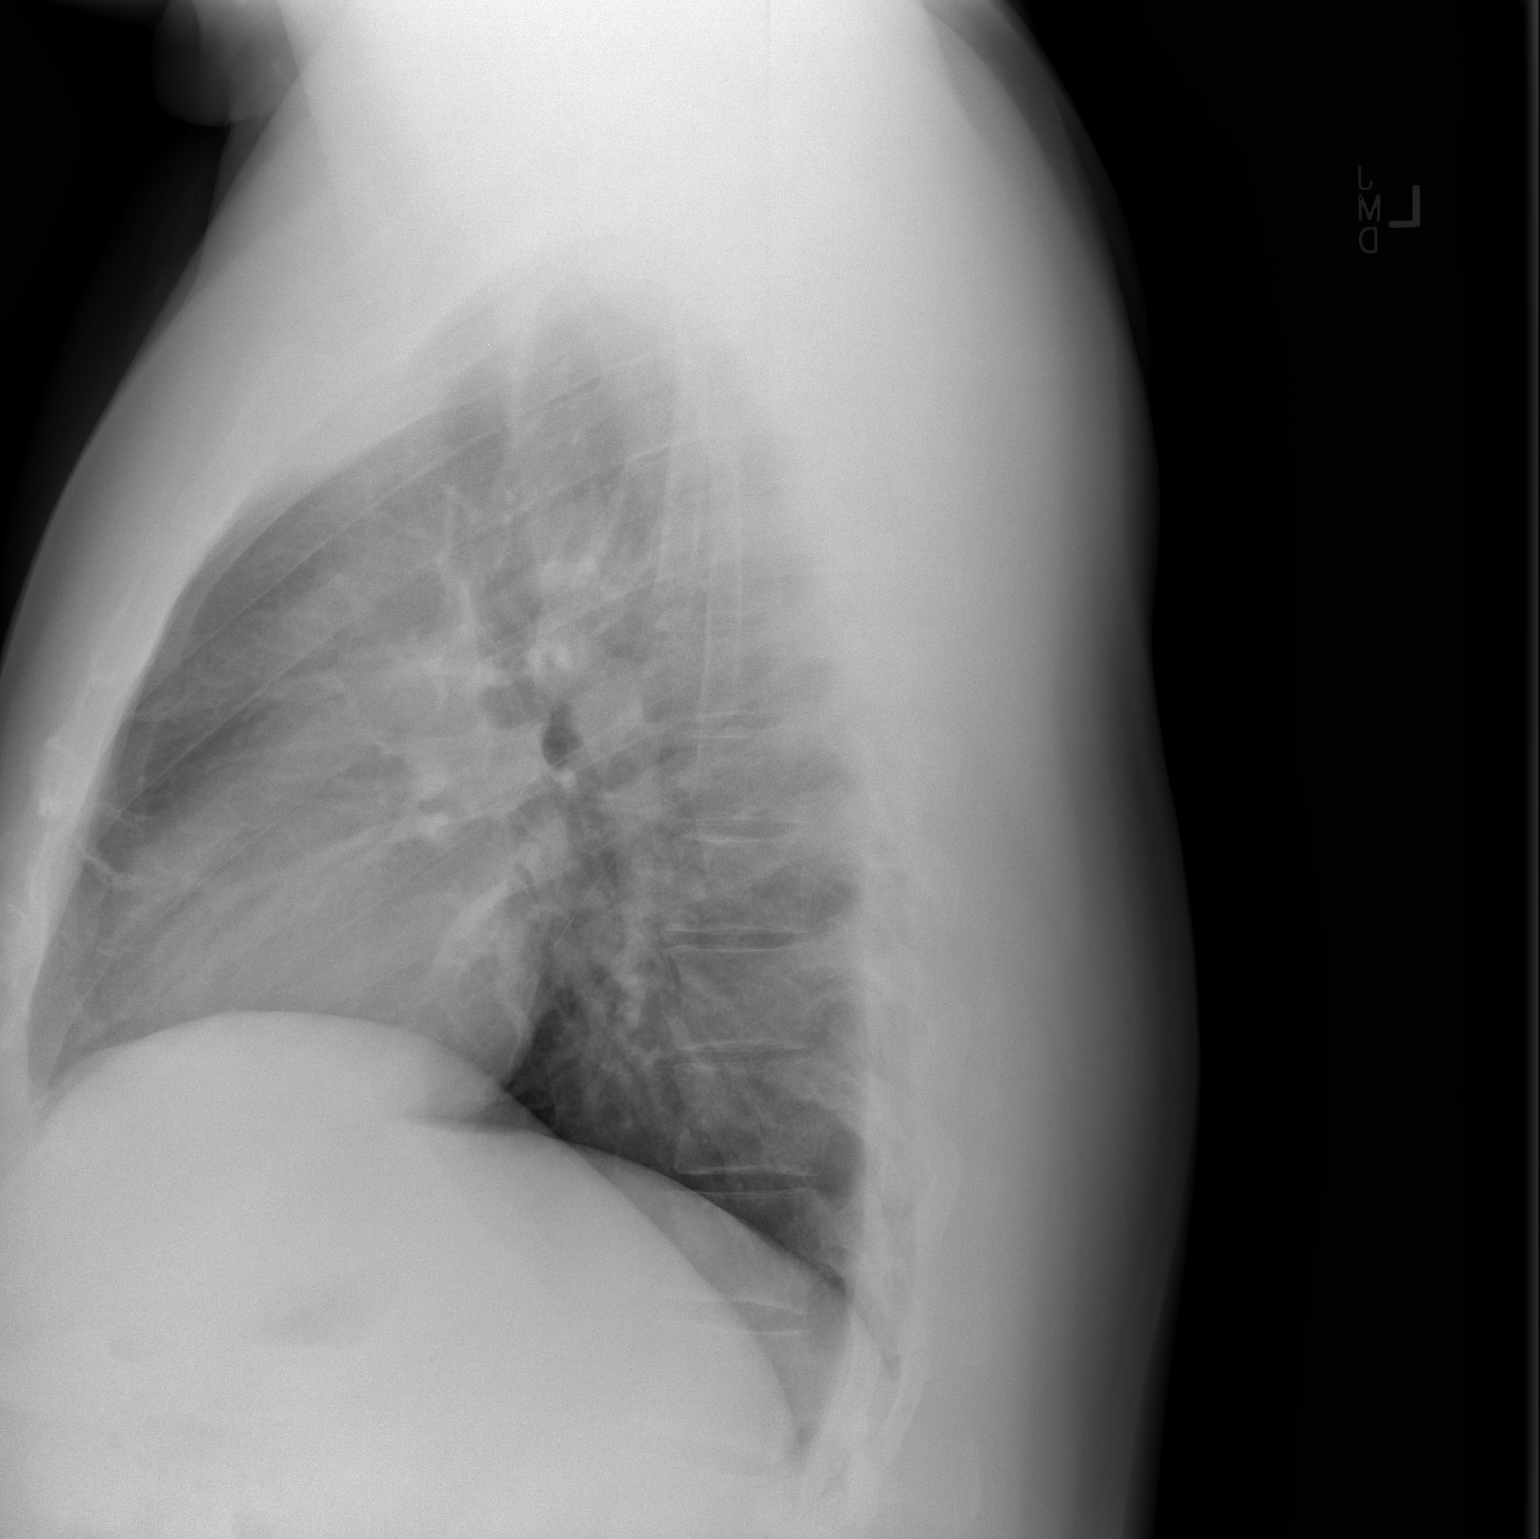

[2 of 2 positions shown; findings below may reference images not displayed]

FINDINGS: The heart size and mediastinal contours are within normal limits.
Both lungs are clear. Previously seen large rounded opacity in the
right upper lobe has resolved since previous study. No evidence of
pleural effusion. The visualized skeletal structures are
unremarkable.
IMPRESSION: No active cardiopulmonary disease.

## 2020-08-18 DIAGNOSIS — Z1159 Encounter for screening for other viral diseases: Secondary | ICD-10-CM | POA: Diagnosis not present

## 2020-08-18 DIAGNOSIS — Z20828 Contact with and (suspected) exposure to other viral communicable diseases: Secondary | ICD-10-CM | POA: Diagnosis not present

## 2020-10-14 ENCOUNTER — Ambulatory Visit (HOSPITAL_COMMUNITY)
Admission: EM | Admit: 2020-10-14 | Discharge: 2020-10-14 | Disposition: A | Discharge status: Home / Self Care | Payer: BC Managed Care – PPO | Attending: Urgent Care | Admitting: Urgent Care

## 2020-10-14 ENCOUNTER — Other Ambulatory Visit: Payer: Self-pay

## 2020-10-14 DIAGNOSIS — I119 Hypertensive heart disease without heart failure: Secondary | ICD-10-CM

## 2020-10-14 DIAGNOSIS — R7303 Prediabetes: Secondary | ICD-10-CM

## 2020-10-14 DIAGNOSIS — R030 Elevated blood-pressure reading, without diagnosis of hypertension: Secondary | ICD-10-CM

## 2020-10-14 DIAGNOSIS — N522 Drug-induced erectile dysfunction: Secondary | ICD-10-CM

## 2020-10-14 DIAGNOSIS — Z760 Encounter for issue of repeat prescription: Secondary | ICD-10-CM | POA: Diagnosis not present

## 2020-10-14 DIAGNOSIS — I10 Essential (primary) hypertension: Secondary | ICD-10-CM

## 2020-10-14 DIAGNOSIS — N529 Male erectile dysfunction, unspecified: Secondary | ICD-10-CM

## 2020-10-14 MED ORDER — TELMISARTAN-HCTZ 80-12.5 MG PO TABS: 1.0000 | ORAL_TABLET | Freq: Every day | ORAL | 0 refills | Status: DC

## 2020-10-14 MED ORDER — METFORMIN HCL 500 MG PO TABS: 500.0000 mg | ORAL_TABLET | Freq: Two times a day (BID) | ORAL | 0 refills | Status: DC

## 2020-10-14 MED ORDER — SILDENAFIL CITRATE 20 MG PO TABS: 20.0000 mg | ORAL_TABLET | Freq: Every day | ORAL | 0 refills | Status: DC | PRN

## 2020-10-14 NOTE — ED Provider Notes (Signed)
[facility]   MRN: [id number] DOB: 1985-12-02  Subjective:   Cory Little is a 35 y.o. male presenting for medication refill as he is out of all his medications.  Patient lost his insurance but now has a back.  He is established with a primary care provider but has not been able to see them due to lapse of his insurance.  Denies headache, confusion, chest pain, belly pain, weakness, numbness or tingling.  Denies history of MI, stroke.  No kidney disease.  No current facility-administered medications for this encounter.  Current Outpatient Medications:  .  amphetamine-dextroamphetamine (ADDERALL) 20 MG tablet, Take 1 tablet (20 mg total) by mouth daily., Disp: 30 tablet, Rfl: 0 .  Cholecalciferol 50 MCG (2000 UT) TABS, Take 1 tablet (2,000 Units total) by mouth daily., Disp: 90 tablet, Rfl: 1 .  metFORMIN (GLUCOPHAGE) 500 MG tablet, Take 1 tablet (500 mg total) by mouth 2 (two) times daily with a meal., Disp: 180 tablet, Rfl: 1 .  phentermine (ADIPEX-P) 37.5 MG tablet, Take 1 tablet (37.5 mg total) by mouth daily before breakfast., Disp: 30 tablet, Rfl: 2 .  potassium chloride SA (K-DUR) 20 MEQ tablet, Take 1 tablet (20 mEq total) by mouth 2 (two) times daily., Disp: 180 tablet, Rfl: 1 .  sildenafil (REVATIO) 20 MG tablet, Take 4 tablets (80 mg total) by mouth daily as needed., Disp: 60 tablet, Rfl: 5 .  telmisartan-hydrochlorothiazide (MICARDIS HCT) 80-12.5 MG tablet, Take 1 tablet by mouth daily., Disp: 90 tablet, Rfl: 1   Allergies  Allergen Reactions  . Wellbutrin [Bupropion] Other (See Comments)    tremors    Past Medical History:  Diagnosis Date  . ADD (attention deficit disorder)   . Chest pain   . Chest tightness   . Depression   . GERD (gastroesophageal reflux disease)   . Hypertension   . Intolerance to cold   . Kidney stone 2004  . Sleep apnea      Past Surgical History:  Procedure Laterality Date  . facial liposuction    . KIDNEY STONE  SURGERY    . WISDOM TOOTH EXTRACTION  2016    Family History  Problem Relation Age of Onset  . Sleep apnea Father   . Depression Mother     Social History   Tobacco Use  . Smoking status: Former Smoker    Types: Cigarettes  . Smokeless tobacco: Never Used  Substance Use Topics  . Alcohol use: No    Comment: socially  . Drug use: Yes    Types: Marijuana    ROS   Objective:   Vitals: BP (S) (!) 163/119 (BP Location: Left Arm) Comment: BP rechecked in room 3  Pulse 63   Temp 98.2 F (36.8 C) (Oral)   Resp 18   SpO2 98%   Physical Exam Constitutional:      General: He is not in acute distress.    Appearance: Normal appearance. He is well-developed and normal weight. He is not ill-appearing, toxic-appearing or diaphoretic.  HENT:     Head: Normocephalic and atraumatic.     Right Ear: External ear normal.     Left Ear: External ear normal.     Nose: Nose normal.     Mouth/Throat:     Mouth: Mucous membranes are moist.     Pharynx: Oropharynx is clear.  Eyes:     General: No scleral icterus.       Right eye: No discharge.  Left eye: No discharge.     Extraocular Movements: Extraocular movements intact.     Conjunctiva/sclera: Conjunctivae normal.     Pupils: Pupils are equal, round, and reactive to light.  Cardiovascular:     Rate and Rhythm: Normal rate and regular rhythm.     Heart sounds: Normal heart sounds. No murmur heard. No friction rub. No gallop.   Pulmonary:     Effort: Pulmonary effort is normal. No respiratory distress.     Breath sounds: Normal breath sounds. No stridor. No wheezing, rhonchi or rales.  Musculoskeletal:     Cervical back: Normal range of motion.  Skin:    General: Skin is warm and dry.  Neurological:     Mental Status: He is alert and oriented to person, place, and time.     Cranial Nerves: No cranial nerve deficit.     Motor: No weakness.     Coordination: Coordination normal.     Gait: Gait normal.     Deep Tendon  Reflexes: Reflexes normal.  Psychiatric:        Mood and Affect: Mood normal.        Behavior: Behavior normal.        Thought Content: Thought content normal.        Judgment: Judgment normal.     Assessment and Plan :   I have reviewed the PDMP during this encounter.  1. Encounter for medication refill   2. Essential hypertension   3. Elevated blood pressure reading   4. Hypertensive left ventricular hypertrophy, without heart failure   5. Prediabetes   6. Drug-induced erectile dysfunction   7. Pre-diabetes   8. Erectile dysfunction, unspecified erectile dysfunction type     I refilled patient's telmisartan hydrochlorothiazide, Metformin and sildenafil.  Emphasized need for follow-up with his PCP for refill of his Adderall.  Needs very close follow-up regarding his uncontrolled hypertension. Counseled patient on potential for adverse effects with medications prescribed/recommended today, ER and return-to-clinic precautions discussed, patient verbalized understanding.    Wallis Bamberg, [place] 10/14/20 1207

## 2020-10-14 NOTE — ED Triage Notes (Signed)
Pt needs refills on meds.

## 2020-11-19 DIAGNOSIS — G44209 Tension-type headache, unspecified, not intractable: Secondary | ICD-10-CM | POA: Diagnosis not present

## 2020-11-19 DIAGNOSIS — M9902 Segmental and somatic dysfunction of thoracic region: Secondary | ICD-10-CM | POA: Diagnosis not present

## 2020-11-19 DIAGNOSIS — M609 Myositis, unspecified: Secondary | ICD-10-CM | POA: Diagnosis not present

## 2020-11-19 DIAGNOSIS — M9901 Segmental and somatic dysfunction of cervical region: Secondary | ICD-10-CM | POA: Diagnosis not present

## 2020-11-23 DIAGNOSIS — M609 Myositis, unspecified: Secondary | ICD-10-CM | POA: Diagnosis not present

## 2020-11-23 DIAGNOSIS — G44209 Tension-type headache, unspecified, not intractable: Secondary | ICD-10-CM | POA: Diagnosis not present

## 2020-11-23 DIAGNOSIS — M9902 Segmental and somatic dysfunction of thoracic region: Secondary | ICD-10-CM | POA: Diagnosis not present

## 2020-11-23 DIAGNOSIS — M9901 Segmental and somatic dysfunction of cervical region: Secondary | ICD-10-CM | POA: Diagnosis not present

## 2021-02-17 DIAGNOSIS — M609 Myositis, unspecified: Secondary | ICD-10-CM | POA: Diagnosis not present

## 2021-02-17 DIAGNOSIS — M9902 Segmental and somatic dysfunction of thoracic region: Secondary | ICD-10-CM | POA: Diagnosis not present

## 2021-02-17 DIAGNOSIS — M9901 Segmental and somatic dysfunction of cervical region: Secondary | ICD-10-CM | POA: Diagnosis not present

## 2021-02-17 DIAGNOSIS — G44209 Tension-type headache, unspecified, not intractable: Secondary | ICD-10-CM | POA: Diagnosis not present

## 2021-02-19 DIAGNOSIS — G44209 Tension-type headache, unspecified, not intractable: Secondary | ICD-10-CM | POA: Diagnosis not present

## 2021-02-19 DIAGNOSIS — M9901 Segmental and somatic dysfunction of cervical region: Secondary | ICD-10-CM | POA: Diagnosis not present

## 2021-02-19 DIAGNOSIS — M609 Myositis, unspecified: Secondary | ICD-10-CM | POA: Diagnosis not present

## 2021-02-19 DIAGNOSIS — M9902 Segmental and somatic dysfunction of thoracic region: Secondary | ICD-10-CM | POA: Diagnosis not present

## 2021-02-26 ENCOUNTER — Encounter: Payer: Self-pay | Admitting: Internal Medicine

## 2021-02-26 ENCOUNTER — Other Ambulatory Visit: Payer: Self-pay

## 2021-02-26 ENCOUNTER — Ambulatory Visit: Payer: BC Managed Care – PPO | Admitting: Internal Medicine

## 2021-02-26 VITALS — BP 146/106 | HR 88 | Temp 98.2°F | Resp 61 | Ht 70.0 in | Wt 264.0 lb

## 2021-02-26 DIAGNOSIS — Z0001 Encounter for general adult medical examination with abnormal findings: Secondary | ICD-10-CM | POA: Diagnosis not present

## 2021-02-26 DIAGNOSIS — E876 Hypokalemia: Secondary | ICD-10-CM | POA: Diagnosis not present

## 2021-02-26 DIAGNOSIS — F900 Attention-deficit hyperactivity disorder, predominantly inattentive type: Secondary | ICD-10-CM

## 2021-02-26 DIAGNOSIS — I119 Hypertensive heart disease without heart failure: Secondary | ICD-10-CM

## 2021-02-26 DIAGNOSIS — R7303 Prediabetes: Secondary | ICD-10-CM

## 2021-02-26 DIAGNOSIS — Z0000 Encounter for general adult medical examination without abnormal findings: Secondary | ICD-10-CM

## 2021-02-26 DIAGNOSIS — I10 Essential (primary) hypertension: Secondary | ICD-10-CM | POA: Diagnosis not present

## 2021-02-26 DIAGNOSIS — N522 Drug-induced erectile dysfunction: Secondary | ICD-10-CM

## 2021-02-26 DIAGNOSIS — F411 Generalized anxiety disorder: Secondary | ICD-10-CM

## 2021-02-26 DIAGNOSIS — T502X5A Adverse effect of carbonic-anhydrase inhibitors, benzothiadiazides and other diuretics, initial encounter: Secondary | ICD-10-CM

## 2021-02-26 DIAGNOSIS — Z1159 Encounter for screening for other viral diseases: Secondary | ICD-10-CM | POA: Insufficient documentation

## 2021-02-26 DIAGNOSIS — E559 Vitamin D deficiency, unspecified: Secondary | ICD-10-CM

## 2021-02-26 LAB — LIPID PANEL
Cholesterol: 194 mg/dL (ref 0–200)
HDL: 33.2 mg/dL — ABNORMAL LOW (ref >39.00)
NonHDL: 160.72
Total CHOL/HDL Ratio: 6
Triglycerides: 261 mg/dL — ABNORMAL HIGH (ref 0.0–149.0)
VLDL: 52.2 mg/dL — ABNORMAL HIGH (ref 0.0–40.0)

## 2021-02-26 LAB — CBC WITH DIFFERENTIAL/PLATELET
Basophils Absolute: 0.1 10*3/uL (ref 0.0–0.1)
Basophils Relative: 1.1 % (ref 0.0–3.0)
Eosinophils Absolute: 0.1 10*3/uL (ref 0.0–0.7)
Eosinophils Relative: 0.7 % (ref 0.0–5.0)
HCT: 46 % (ref 39.0–52.0)
Hemoglobin: 15.9 g/dL (ref 13.0–17.0)
Lymphocytes Relative: 30.5 % (ref 12.0–46.0)
Lymphs Abs: 2.2 10*3/uL (ref 0.7–4.0)
MCHC: 34.6 g/dL (ref 30.0–36.0)
MCV: 89.6 fl (ref 78.0–100.0)
Monocytes Absolute: 0.7 10*3/uL (ref 0.1–1.0)
Monocytes Relative: 9.9 % (ref 3.0–12.0)
Neutro Abs: 4.2 10*3/uL (ref 1.4–7.7)
Neutrophils Relative %: 57.8 % (ref 43.0–77.0)
Platelets: 248 10*3/uL (ref 150.0–400.0)
RBC: 5.14 Mil/uL (ref 4.22–5.81)
RDW: 13 % (ref 11.5–15.5)
WBC: 7.2 10*3/uL (ref 4.0–10.5)

## 2021-02-26 LAB — BASIC METABOLIC PANEL
BUN: 14 mg/dL (ref 6–23)
CO2: 27 mEq/L (ref 19–32)
Calcium: 9.5 mg/dL (ref 8.4–10.5)
Chloride: 103 mEq/L (ref 96–112)
Creatinine, Ser: 1.14 mg/dL (ref 0.40–1.50)
GFR: 82.46 mL/min (ref >60.00)
Glucose, Bld: 78 mg/dL (ref 70–99)
Potassium: 3.4 mEq/L — ABNORMAL LOW (ref 3.5–5.1)
Sodium: 140 mEq/L (ref 135–145)

## 2021-02-26 LAB — HEPATIC FUNCTION PANEL
ALT: 55 U/L — ABNORMAL HIGH (ref 0–53)
AST: 26 U/L (ref 0–37)
Albumin: 4.5 g/dL (ref 3.5–5.2)
Alkaline Phosphatase: 68 U/L (ref 39–117)
Bilirubin, Direct: 0.1 mg/dL (ref 0.0–0.3)
Total Bilirubin: 0.6 mg/dL (ref 0.2–1.2)
Total Protein: 7.2 g/dL (ref 6.0–8.3)

## 2021-02-26 LAB — TSH: TSH: 1.02 u[IU]/mL (ref 0.35–5.50)

## 2021-02-26 LAB — LDL CHOLESTEROL, DIRECT: Direct LDL: 129 mg/dL

## 2021-02-26 LAB — HEMOGLOBIN A1C: Hgb A1c MFr Bld: 5.5 % (ref 4.6–6.5)

## 2021-02-26 MED ORDER — SILDENAFIL CITRATE 20 MG PO TABS: 20.0000 mg | ORAL_TABLET | Freq: Every day | ORAL | 2 refills | Status: DC | PRN

## 2021-02-26 MED ORDER — TELMISARTAN-HCTZ 80-12.5 MG PO TABS: 1.0000 | ORAL_TABLET | Freq: Every day | ORAL | 0 refills | Status: DC

## 2021-02-26 MED ORDER — BUSPIRONE HCL 5 MG PO TABS: 5.0000 mg | ORAL_TABLET | Freq: Two times a day (BID) | ORAL | 0 refills | Status: DC

## 2021-02-26 MED ORDER — METFORMIN HCL 500 MG PO TABS: 500.0000 mg | ORAL_TABLET | Freq: Two times a day (BID) | ORAL | 0 refills | Status: DC

## 2021-02-26 NOTE — Patient Instructions (Signed)

## 2021-02-26 NOTE — Progress Notes (Signed)
d  Subjective:  Patient ID: Cory Little, male    DOB: 1985/09/22  Age: 35 y.o. MRN: [id number]  CC: Annual Exam and Hypertension  This visit occurred during the SARS-CoV-2 public health emergency.  Safety protocols were in place, including screening questions prior to the visit, additional usage of staff PPE, and extensive cleaning of exam room while observing appropriate contact time as indicated for disinfecting solutions.    HPI BERNON ARVISO presents for f/up and a CPX.  He has not been taking his antihypertensive recently.  He is active and denies any recent episodes of headache, blurred vision, chest pain, shortness of breath, dyspnea on exertion, diaphoresis, or edema.  He wants to try BuSpar for anxiety.  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  He tells me he needs a note for work accommodations so that he does not have to do bending, twisting, or lifting more than 20 pounds.  Outpatient Medications Prior to Visit  Medication Sig Dispense Refill   Cholecalciferol 50 MCG (2000 UT) TABS Take 1 tablet (2,000 Units total) by mouth daily. 90 tablet 1   amphetamine-dextroamphetamine (ADDERALL) 20 MG tablet Take 1 tablet (20 mg total) by mouth daily. 30 tablet 0   metFORMIN (GLUCOPHAGE) 500 MG tablet Take 1 tablet (500 mg total) by mouth 2 (two) times daily with a meal. 180 tablet 0   phentermine (ADIPEX-P) 37.5 MG tablet Take 1 tablet (37.5 mg total) by mouth daily before breakfast. 30 tablet 2   potassium chloride SA (K-DUR) 20 MEQ tablet Take 1 tablet (20 mEq total) by mouth 2 (two) times daily. 180 tablet 1   sildenafil (REVATIO) 20 MG tablet Take 1 tablet (20 mg total) by mouth daily as needed. 5 tablet 0   telmisartan-hydrochlorothiazide (MICARDIS HCT) 80-12.5 MG tablet Take 1 tablet by mouth daily. 90 tablet 0   No facility-administered medications prior to visit.    ROS Review of Systems   Constitutional:  Negative for diaphoresis, fatigue and unexpected weight change.  HENT: Negative.    Eyes: Negative.   Respiratory:  Negative for apnea, cough, shortness of breath and wheezing.   Cardiovascular:  Negative for chest pain, palpitations and leg swelling.  Gastrointestinal:  Negative for abdominal pain, constipation, diarrhea, nausea and vomiting.  Endocrine: Negative.   Genitourinary: Negative.  Negative for scrotal swelling and testicular pain.  Musculoskeletal:  Positive for arthralgias and back pain. Negative for myalgias and neck pain.  Skin: Negative.  Negative for color change and rash.  Neurological: Negative.  Negative for dizziness, weakness, light-headedness, numbness and headaches.  Hematological:  Negative for adenopathy. Does not bruise/bleed easily.  Psychiatric/Behavioral:  Negative for confusion, decreased concentration, dysphoric mood, self-injury, sleep disturbance and suicidal ideas. The patient is nervous/anxious.    Objective:  BP (!) 146/106 (BP Location: Right Arm, Patient Position: Sitting, Cuff Size: Large)   Pulse 88   Temp 98.2 F (36.8 C) (Oral)   Resp (!) 61   Ht 5\' 10"  (1.778 m)   Wt 264 lb (119.7 kg)   SpO2 97%   BMI 37.88 kg/m   BP Readings from Last 3 Encounters:  02/26/21 (!) 146/106  10/14/20 (S) (!) 163/119  06/22/19 120/80    Wt Readings from Last 3 Encounters:  02/26/21 264 lb (119.7 kg)  06/22/19 270 lb (122.5 kg)  03/11/19 269 lb (122 kg)    Physical Exam Vitals reviewed.  Constitutional:      General: He is not in acute distress.    Appearance: He is obese. He is not toxic-appearing or diaphoretic.  HENT:     Nose: Nose normal.     Mouth/Throat:     Mouth: Mucous membranes are moist.  Eyes:     Conjunctiva/sclera: Conjunctivae normal.  Cardiovascular:     Rate and Rhythm: Normal rate and regular rhythm.     Heart sounds: Normal heart sounds, S1 normal and S2 normal.    No friction rub. No gallop.      Comments: EKG- NSR, 80 bpm Moderate LVH unchanged Pulmonary:     Effort: Pulmonary effort is normal.     Breath sounds: No stridor. No wheezing, rhonchi or rales.  Abdominal:     General: Abdomen is protuberant. Bowel sounds are normal. There is no distension.     Palpations: Abdomen is soft. There is no hepatomegaly, splenomegaly or mass.     Tenderness: There is no abdominal tenderness.  Musculoskeletal:     Cervical back: Neck supple.     Right lower leg: No edema.     Left lower leg: No edema.  Lymphadenopathy:     Cervical: No cervical adenopathy.  Skin:    General: Skin is warm and dry.  Neurological:     General: No focal deficit present.     Mental Status: He is alert.  Psychiatric:        Attention and Perception: Attention and perception normal.        Mood and Affect: Mood is anxious. Mood is not depressed. Affect is angry.        Speech: Speech normal. He is communicative. Speech is not rapid and pressured, delayed, slurred or tangential.        Behavior: Behavior normal. Behavior is not agitated, slowed, aggressive, withdrawn, hyperactive or combative. Behavior is cooperative.        Thought Content: Thought content normal. Thought content is not paranoid or delusional. Thought content does not include homicidal or suicidal ideation.        Cognition and Memory: Cognition normal.    Lab Results  Component Value Date   WBC 7.2 02/26/2021   HGB 15.9 02/26/2021   HCT 46.0 02/26/2021   PLT 248.0 02/26/2021   GLUCOSE 78 02/26/2021   CHOL 194 02/26/2021   TRIG 261.0 (H) 02/26/2021   HDL 33.20 (L) 02/26/2021   LDLDIRECT 129.0 02/26/2021   LDLCALC 134 (H) 03/11/2019   ALT 55 (H) 02/26/2021   AST 26 02/26/2021   NA 140 02/26/2021   K 3.4 (L) 02/26/2021   CL 103 02/26/2021   CREATININE 1.14 02/26/2021   BUN 14 02/26/2021   CO2 27 02/26/2021   TSH 1.02 02/26/2021   HGBA1C 5.5 02/26/2021    No results found.  Assessment & Plan:   Issak was seen today for  annual exam and hypertension.  Diagnoses and all orders for this visit:  Essential hypertension- His blood pressure is not adequately well controlled and he has developed LVH.  Will check his labs to screen for secondary causes and endorgan damage.  Will restart the ARB and thiazide diuretic.  Will treat the hypokalemia. -     telmisartan-hydrochlorothiazide (MICARDIS HCT) 80-12.5 MG tablet; Take 1 tablet by mouth daily. -     EKG 12-Lead -     CBC with Differential/Platelet; Future -     Basic metabolic panel; Future -     TSH; Future -  Urinalysis, Routine w reflex microscopic; Future -     Hepatic function panel; Future -     Urine drugs of abuse scrn w alc, routine (Ref Lab); Future -     Urine drugs of abuse scrn w alc, routine (Ref Lab) -     Hepatic function panel -     Urinalysis, Routine w reflex microscopic -     TSH -     Basic metabolic panel -     CBC with Differential/Platelet  Routine general medical examination at a health care facility- Exam completed, labs reviewed, vaccines reviewed, no cancer screenings indicated, patient education was given. -     Lipid panel; Future -     Hepatitis C antibody; Future -     HIV Antibody (routine testing w rflx); Future -     HIV Antibody (routine testing w rflx) -     Hepatitis C antibody -     Lipid panel  Vitamin D deficiency  Diuretic-induced hypokalemia -     Basic metabolic panel; Future -     Basic metabolic panel  Prediabetes -     metFORMIN (GLUCOPHAGE) 500 MG tablet; Take 1 tablet (500 mg total) by mouth 2 (two) times daily with a meal. -     Basic metabolic panel; Future -     Hemoglobin A1c; Future -     Hemoglobin A1c -     Basic metabolic panel  Drug-induced erectile dysfunction -     sildenafil (REVATIO) 20 MG tablet; Take 1 tablet (20 mg total) by mouth daily as needed.  Hypertensive left ventricular hypertrophy, without heart failure -     telmisartan-hydrochlorothiazide (MICARDIS HCT) 80-12.5 MG  tablet; Take 1 tablet by mouth daily. -     EKG 12-Lead -     Basic metabolic panel; Future -     Hepatic function panel; Future -     Urine drugs of abuse scrn w alc, routine (Ref Lab); Future -     Urine drugs of abuse scrn w alc, routine (Ref Lab) -     Hepatic function panel -     Basic metabolic panel  Attention deficit hyperactivity disorder (ADHD), predominantly inattentive type -     Urine drugs of abuse scrn w alc, routine (Ref Lab); Future -     Urine drugs of abuse scrn w alc, routine (Ref Lab)  Encounter for general adult medical examination with abnormal findings  Need for hepatitis C screening test -     Hepatitis C antibody; Future -     Hepatitis C antibody  GAD (generalized anxiety disorder) -     busPIRone (BUSPAR) 5 MG tablet; Take 1 tablet (5 mg total) by mouth 2 (two) times daily.  Other orders -     LDL cholesterol, direct  I have discontinued Beulah Gandy L. Reichenberger's potassium chloride SA, phentermine, and amphetamine-dextroamphetamine. I am also having him start on busPIRone. Additionally, I am having him maintain his Cholecalciferol, metFORMIN, sildenafil, and telmisartan-hydrochlorothiazide.  Meds ordered this encounter  Medications   metFORMIN (GLUCOPHAGE) 500 MG tablet    Sig: Take 1 tablet (500 mg total) by mouth 2 (two) times daily with a meal.    Dispense:  180 tablet    Refill:  0   sildenafil (REVATIO) 20 MG tablet    Sig: Take 1 tablet (20 mg total) by mouth daily as needed.    Dispense:  5 tablet    Refill:  2   telmisartan-hydrochlorothiazide (  MICARDIS HCT) 80-12.5 MG tablet    Sig: Take 1 tablet by mouth daily.    Dispense:  90 tablet    Refill:  0   busPIRone (BUSPAR) 5 MG tablet    Sig: Take 1 tablet (5 mg total) by mouth 2 (two) times daily.    Dispense:  180 tablet    Refill:  0     Follow-up: Return in about 6 weeks (around 04/09/2021).  Sanda Lingerhomas Chadwick Reiswig, MD

## 2021-02-27 ENCOUNTER — Encounter: Payer: Self-pay | Admitting: Internal Medicine

## 2021-02-27 LAB — URINALYSIS, ROUTINE W REFLEX MICROSCOPIC
Bilirubin Urine: NEGATIVE
Leukocytes,Ua: NEGATIVE
Nitrite: NEGATIVE
RBC / HPF: NONE SEEN (ref 0–?)
Specific Gravity, Urine: 1.025 (ref 1.000–1.030)
Total Protein, Urine: NEGATIVE
Urine Glucose: NEGATIVE
Urobilinogen, UA: 1 (ref 0.0–1.0)
pH: 6 (ref 5.0–8.0)

## 2021-02-27 LAB — HEPATITIS C ANTIBODY
Hepatitis C Ab: NONREACTIVE
SIGNAL TO CUT-OFF: 0.01 (ref <1.00)

## 2021-02-27 LAB — HIV ANTIBODY (ROUTINE TESTING W REFLEX): HIV 1&2 Ab, 4th Generation: NONREACTIVE

## 2021-02-27 MED ORDER — POTASSIUM CHLORIDE CRYS ER 20 MEQ PO TBCR: 20.0000 meq | EXTENDED_RELEASE_TABLET | Freq: Two times a day (BID) | ORAL | 0 refills | Status: DC

## 2021-03-03 LAB — URINE DRUGS OF ABUSE SCREEN W ALC, ROUTINE (REF LAB)
Amphetamines, Urine: NEGATIVE ng/mL
Barbiturate Quant, Ur: NEGATIVE ng/mL
Benzodiazepine Quant, Ur: NEGATIVE ng/mL
Cocaine (Metab.): NEGATIVE ng/mL
Ethanol, Urine: NEGATIVE %
Methadone Screen, Urine: NEGATIVE ng/mL
Opiate Quant, Ur: NEGATIVE ng/mL
PCP Quant, Ur: NEGATIVE ng/mL
Propoxyphene: NEGATIVE ng/mL

## 2021-03-03 LAB — PANEL 799049
CARBOXY THC GC/MS CONF: >750 ng/mL
Cannabinoid GC/MS, Ur: POSITIVE — AB

## 2021-03-09 DIAGNOSIS — G44209 Tension-type headache, unspecified, not intractable: Secondary | ICD-10-CM | POA: Diagnosis not present

## 2021-03-09 DIAGNOSIS — M609 Myositis, unspecified: Secondary | ICD-10-CM | POA: Diagnosis not present

## 2021-03-09 DIAGNOSIS — M9901 Segmental and somatic dysfunction of cervical region: Secondary | ICD-10-CM | POA: Diagnosis not present

## 2021-03-09 DIAGNOSIS — M9902 Segmental and somatic dysfunction of thoracic region: Secondary | ICD-10-CM | POA: Diagnosis not present

## 2021-03-14 ENCOUNTER — Telehealth: Payer: Self-pay | Admitting: Emergency Medicine

## 2021-03-14 DIAGNOSIS — M609 Myositis, unspecified: Secondary | ICD-10-CM | POA: Diagnosis not present

## 2021-03-14 DIAGNOSIS — M9902 Segmental and somatic dysfunction of thoracic region: Secondary | ICD-10-CM | POA: Diagnosis not present

## 2021-03-14 DIAGNOSIS — G44209 Tension-type headache, unspecified, not intractable: Secondary | ICD-10-CM | POA: Diagnosis not present

## 2021-03-14 DIAGNOSIS — M9901 Segmental and somatic dysfunction of cervical region: Secondary | ICD-10-CM | POA: Diagnosis not present

## 2021-03-14 NOTE — Telephone Encounter (Signed)
FMLA paperwork placed in MD's box  to be filled out and signed.

## 2021-03-15 DIAGNOSIS — Z0279 Encounter for issue of other medical certificate: Secondary | ICD-10-CM

## 2021-03-15 NOTE — Telephone Encounter (Signed)
Forms have bee signed and faxed back  Copy sent to scan Copy given to charge Copy mailed to pt per pt request. Original filed with CMA

## 2021-03-15 NOTE — Telephone Encounter (Signed)
Forms have bee completed and placed on PCPs desk for review and signature.

## 2021-03-21 ENCOUNTER — Encounter: Payer: BC Managed Care – PPO | Admitting: Internal Medicine

## 2021-03-21 DIAGNOSIS — M25562 Pain in left knee: Secondary | ICD-10-CM | POA: Diagnosis not present

## 2021-03-21 DIAGNOSIS — G44209 Tension-type headache, unspecified, not intractable: Secondary | ICD-10-CM | POA: Diagnosis not present

## 2021-03-21 DIAGNOSIS — M609 Myositis, unspecified: Secondary | ICD-10-CM | POA: Diagnosis not present

## 2021-03-21 DIAGNOSIS — M25561 Pain in right knee: Secondary | ICD-10-CM | POA: Diagnosis not present

## 2021-03-21 DIAGNOSIS — M9901 Segmental and somatic dysfunction of cervical region: Secondary | ICD-10-CM | POA: Diagnosis not present

## 2021-03-21 DIAGNOSIS — M9902 Segmental and somatic dysfunction of thoracic region: Secondary | ICD-10-CM | POA: Diagnosis not present

## 2021-03-28 DIAGNOSIS — M609 Myositis, unspecified: Secondary | ICD-10-CM | POA: Diagnosis not present

## 2021-03-28 DIAGNOSIS — M9901 Segmental and somatic dysfunction of cervical region: Secondary | ICD-10-CM | POA: Diagnosis not present

## 2021-03-28 DIAGNOSIS — G44209 Tension-type headache, unspecified, not intractable: Secondary | ICD-10-CM | POA: Diagnosis not present

## 2021-03-28 DIAGNOSIS — M9902 Segmental and somatic dysfunction of thoracic region: Secondary | ICD-10-CM | POA: Diagnosis not present

## 2021-03-28 DIAGNOSIS — M25562 Pain in left knee: Secondary | ICD-10-CM | POA: Diagnosis not present

## 2021-03-28 DIAGNOSIS — M25561 Pain in right knee: Secondary | ICD-10-CM | POA: Diagnosis not present

## 2021-03-31 DIAGNOSIS — U071 COVID-19: Secondary | ICD-10-CM | POA: Diagnosis not present

## 2021-04-11 DIAGNOSIS — M609 Myositis, unspecified: Secondary | ICD-10-CM | POA: Diagnosis not present

## 2021-04-11 DIAGNOSIS — M9902 Segmental and somatic dysfunction of thoracic region: Secondary | ICD-10-CM | POA: Diagnosis not present

## 2021-04-11 DIAGNOSIS — G44209 Tension-type headache, unspecified, not intractable: Secondary | ICD-10-CM | POA: Diagnosis not present

## 2021-04-11 DIAGNOSIS — M9901 Segmental and somatic dysfunction of cervical region: Secondary | ICD-10-CM | POA: Diagnosis not present

## 2021-04-18 DIAGNOSIS — G44209 Tension-type headache, unspecified, not intractable: Secondary | ICD-10-CM | POA: Diagnosis not present

## 2021-04-18 DIAGNOSIS — M9902 Segmental and somatic dysfunction of thoracic region: Secondary | ICD-10-CM | POA: Diagnosis not present

## 2021-04-18 DIAGNOSIS — M25562 Pain in left knee: Secondary | ICD-10-CM | POA: Diagnosis not present

## 2021-04-18 DIAGNOSIS — M9901 Segmental and somatic dysfunction of cervical region: Secondary | ICD-10-CM | POA: Diagnosis not present

## 2021-04-18 DIAGNOSIS — M609 Myositis, unspecified: Secondary | ICD-10-CM | POA: Diagnosis not present

## 2021-04-18 DIAGNOSIS — M25561 Pain in right knee: Secondary | ICD-10-CM | POA: Diagnosis not present

## 2021-04-25 DIAGNOSIS — M25561 Pain in right knee: Secondary | ICD-10-CM | POA: Diagnosis not present

## 2021-04-25 DIAGNOSIS — G44209 Tension-type headache, unspecified, not intractable: Secondary | ICD-10-CM | POA: Diagnosis not present

## 2021-04-25 DIAGNOSIS — M25562 Pain in left knee: Secondary | ICD-10-CM | POA: Diagnosis not present

## 2021-04-25 DIAGNOSIS — M609 Myositis, unspecified: Secondary | ICD-10-CM | POA: Diagnosis not present

## 2021-04-25 DIAGNOSIS — M9902 Segmental and somatic dysfunction of thoracic region: Secondary | ICD-10-CM | POA: Diagnosis not present

## 2021-04-25 DIAGNOSIS — M9901 Segmental and somatic dysfunction of cervical region: Secondary | ICD-10-CM | POA: Diagnosis not present

## 2021-05-03 DIAGNOSIS — M609 Myositis, unspecified: Secondary | ICD-10-CM | POA: Diagnosis not present

## 2021-05-03 DIAGNOSIS — M9902 Segmental and somatic dysfunction of thoracic region: Secondary | ICD-10-CM | POA: Diagnosis not present

## 2021-05-03 DIAGNOSIS — M25561 Pain in right knee: Secondary | ICD-10-CM | POA: Diagnosis not present

## 2021-05-03 DIAGNOSIS — M25562 Pain in left knee: Secondary | ICD-10-CM | POA: Diagnosis not present

## 2021-05-03 DIAGNOSIS — M9901 Segmental and somatic dysfunction of cervical region: Secondary | ICD-10-CM | POA: Diagnosis not present

## 2021-05-03 DIAGNOSIS — G44209 Tension-type headache, unspecified, not intractable: Secondary | ICD-10-CM | POA: Diagnosis not present

## 2021-05-10 DIAGNOSIS — G44209 Tension-type headache, unspecified, not intractable: Secondary | ICD-10-CM | POA: Diagnosis not present

## 2021-05-10 DIAGNOSIS — M25561 Pain in right knee: Secondary | ICD-10-CM | POA: Diagnosis not present

## 2021-05-10 DIAGNOSIS — M609 Myositis, unspecified: Secondary | ICD-10-CM | POA: Diagnosis not present

## 2021-05-10 DIAGNOSIS — M25562 Pain in left knee: Secondary | ICD-10-CM | POA: Diagnosis not present

## 2021-05-10 DIAGNOSIS — M9901 Segmental and somatic dysfunction of cervical region: Secondary | ICD-10-CM | POA: Diagnosis not present

## 2021-05-10 DIAGNOSIS — M9902 Segmental and somatic dysfunction of thoracic region: Secondary | ICD-10-CM | POA: Diagnosis not present

## 2021-05-16 DIAGNOSIS — M9902 Segmental and somatic dysfunction of thoracic region: Secondary | ICD-10-CM | POA: Diagnosis not present

## 2021-05-16 DIAGNOSIS — M609 Myositis, unspecified: Secondary | ICD-10-CM | POA: Diagnosis not present

## 2021-05-16 DIAGNOSIS — G44209 Tension-type headache, unspecified, not intractable: Secondary | ICD-10-CM | POA: Diagnosis not present

## 2021-05-16 DIAGNOSIS — M25561 Pain in right knee: Secondary | ICD-10-CM | POA: Diagnosis not present

## 2021-05-16 DIAGNOSIS — M9901 Segmental and somatic dysfunction of cervical region: Secondary | ICD-10-CM | POA: Diagnosis not present

## 2021-05-16 DIAGNOSIS — M25562 Pain in left knee: Secondary | ICD-10-CM | POA: Diagnosis not present

## 2021-05-23 DIAGNOSIS — M609 Myositis, unspecified: Secondary | ICD-10-CM | POA: Diagnosis not present

## 2021-05-23 DIAGNOSIS — M9902 Segmental and somatic dysfunction of thoracic region: Secondary | ICD-10-CM | POA: Diagnosis not present

## 2021-05-23 DIAGNOSIS — G44209 Tension-type headache, unspecified, not intractable: Secondary | ICD-10-CM | POA: Diagnosis not present

## 2021-05-23 DIAGNOSIS — M9901 Segmental and somatic dysfunction of cervical region: Secondary | ICD-10-CM | POA: Diagnosis not present

## 2021-05-30 DIAGNOSIS — M609 Myositis, unspecified: Secondary | ICD-10-CM | POA: Diagnosis not present

## 2021-05-30 DIAGNOSIS — G44209 Tension-type headache, unspecified, not intractable: Secondary | ICD-10-CM | POA: Diagnosis not present

## 2021-05-30 DIAGNOSIS — M9902 Segmental and somatic dysfunction of thoracic region: Secondary | ICD-10-CM | POA: Diagnosis not present

## 2021-05-30 DIAGNOSIS — M9901 Segmental and somatic dysfunction of cervical region: Secondary | ICD-10-CM | POA: Diagnosis not present

## 2021-05-31 ENCOUNTER — Ambulatory Visit: Payer: BC Managed Care – PPO | Admitting: Internal Medicine

## 2021-06-07 ENCOUNTER — Encounter: Payer: Self-pay | Admitting: Internal Medicine

## 2021-06-07 ENCOUNTER — Ambulatory Visit: Payer: BC Managed Care – PPO | Admitting: Internal Medicine

## 2021-06-07 ENCOUNTER — Other Ambulatory Visit: Payer: Self-pay

## 2021-06-07 VITALS — BP 156/102 | HR 60 | Temp 98.2°F | Resp 16 | Ht 70.0 in | Wt 261.0 lb

## 2021-06-07 DIAGNOSIS — N522 Drug-induced erectile dysfunction: Secondary | ICD-10-CM

## 2021-06-07 DIAGNOSIS — T502X5A Adverse effect of carbonic-anhydrase inhibitors, benzothiadiazides and other diuretics, initial encounter: Secondary | ICD-10-CM

## 2021-06-07 DIAGNOSIS — I10 Essential (primary) hypertension: Secondary | ICD-10-CM | POA: Diagnosis not present

## 2021-06-07 DIAGNOSIS — Z23 Encounter for immunization: Secondary | ICD-10-CM | POA: Diagnosis not present

## 2021-06-07 DIAGNOSIS — E876 Hypokalemia: Secondary | ICD-10-CM

## 2021-06-07 DIAGNOSIS — F411 Generalized anxiety disorder: Secondary | ICD-10-CM

## 2021-06-07 DIAGNOSIS — I119 Hypertensive heart disease without heart failure: Secondary | ICD-10-CM

## 2021-06-07 DIAGNOSIS — F1210 Cannabis abuse, uncomplicated: Secondary | ICD-10-CM

## 2021-06-07 DIAGNOSIS — R7303 Prediabetes: Secondary | ICD-10-CM

## 2021-06-07 LAB — MAGNESIUM: Magnesium: 2.2 mg/dL (ref 1.5–2.5)

## 2021-06-07 LAB — BASIC METABOLIC PANEL
BUN: 16 mg/dL (ref 6–23)
CO2: 28 mEq/L (ref 19–32)
Calcium: 8.9 mg/dL (ref 8.4–10.5)
Chloride: 106 mEq/L (ref 96–112)
Creatinine, Ser: 1.05 mg/dL (ref 0.40–1.50)
GFR: 90.84 mL/min (ref >60.00)
Glucose, Bld: 97 mg/dL (ref 70–99)
Potassium: 3.7 mEq/L (ref 3.5–5.1)
Sodium: 140 mEq/L (ref 135–145)

## 2021-06-07 MED ORDER — METFORMIN HCL 500 MG PO TABS: 500.0000 mg | ORAL_TABLET | Freq: Two times a day (BID) | ORAL | 0 refills | Status: DC

## 2021-06-07 MED ORDER — BUSPIRONE HCL 10 MG PO TABS: 10.0000 mg | ORAL_TABLET | Freq: Three times a day (TID) | ORAL | 0 refills | Status: DC

## 2021-06-07 MED ORDER — TELMISARTAN-HCTZ 80-12.5 MG PO TABS: 1.0000 | ORAL_TABLET | Freq: Every day | ORAL | 0 refills | Status: DC

## 2021-06-07 MED ORDER — SILDENAFIL CITRATE 20 MG PO TABS: 20.0000 mg | ORAL_TABLET | Freq: Every day | ORAL | 2 refills | Status: DC | PRN

## 2021-06-07 MED ORDER — POTASSIUM CHLORIDE CRYS ER 20 MEQ PO TBCR: 20.0000 meq | EXTENDED_RELEASE_TABLET | Freq: Two times a day (BID) | ORAL | 0 refills | Status: DC

## 2021-06-07 NOTE — Progress Notes (Signed)
Subjective:  Patient ID: Cory Little, male    DOB: [birth year]  Age: 35 y.o. MRN: [id number]  CC: Hypertension  This visit occurred during the SARS-CoV-2 public health emergency.  Safety protocols were in place, including screening questions prior to the visit, additional usage of staff PPE, and extensive cleaning of exam room while observing appropriate contact time as indicated for disinfecting solutions.    HPI Cory Little presents for f/up -  He has not taken any antihypertensives in at least 3 days.  He says he ran out.  He denies headache, blurred vision, chest pain, shortness of breath, diaphoresis, dizziness, lightheadedness, or edema. He would like to increase the buspar dose.  Outpatient Medications Prior to Visit  Medication Sig Dispense Refill   Cholecalciferol 50 MCG (2000 UT) TABS Take 1 tablet (2,000 Units total) by mouth daily. 90 tablet 1   busPIRone (BUSPAR) 5 MG tablet Take 1 tablet (5 mg total) by mouth 2 (two) times daily. 180 tablet 0   metFORMIN (GLUCOPHAGE) 500 MG tablet Take 1 tablet (500 mg total) by mouth 2 (two) times daily with a meal. 180 tablet 0   potassium chloride SA (KLOR-CON) 20 MEQ tablet Take 1 tablet (20 mEq total) by mouth 2 (two) times daily. 180 tablet 0   sildenafil (REVATIO) 20 MG tablet Take 1 tablet (20 mg total) by mouth daily as needed. 5 tablet 2   telmisartan-hydrochlorothiazide (MICARDIS HCT) 80-12.5 MG tablet Take 1 tablet by mouth daily. 90 tablet 0   No facility-administered medications prior to visit.    ROS Review of Systems  Constitutional:  Negative for diaphoresis, fatigue and fever.  HENT: Negative.    Eyes: Negative.   Respiratory:  Negative for cough, chest tightness, shortness of breath and wheezing.   Cardiovascular:  Negative for chest pain, palpitations and leg swelling.  Gastrointestinal:  Negative for abdominal pain, diarrhea, nausea and vomiting.  Endocrine: Negative.   Genitourinary: Negative.  Negative  for difficulty urinating and hematuria.  Musculoskeletal:  Negative for arthralgias.  Skin: Negative.   Neurological: Negative.  Negative for dizziness, seizures, speech difficulty, weakness, light-headedness and headaches.  Hematological:  Negative for adenopathy. Does not bruise/bleed easily.  Psychiatric/Behavioral:  Negative for behavioral problems, confusion, decreased concentration, dysphoric mood, hallucinations, sleep disturbance and suicidal ideas. The patient is nervous/anxious.    Objective:  BP (!) 156/102 (BP Location: Right Arm, Patient Position: Sitting, Cuff Size: Large)   Pulse 60   Temp 98.2 F (36.8 C) (Oral)   Resp 16   Ht 5\' 10"  (1.778 m)   Wt 261 lb (118.4 kg)   SpO2 96%   BMI 37.45 kg/m   BP Readings from Last 3 Encounters:  06/07/21 (!) 156/102  02/26/21 (!) 146/106  10/14/20 (S) (!) 163/119    Wt Readings from Last 3 Encounters:  06/07/21 261 lb (118.4 kg)  02/26/21 264 lb (119.7 kg)  06/22/19 270 lb (122.5 kg)    Physical Exam Vitals reviewed.  Constitutional:      Appearance: Normal appearance.  HENT:     Mouth/Throat:     Mouth: Mucous membranes are moist.  Eyes:     Conjunctiva/sclera: Conjunctivae normal.  Cardiovascular:     Rate and Rhythm: Normal rate and regular rhythm.     Heart sounds: No murmur heard. Pulmonary:     Effort: Pulmonary effort is normal.     Breath sounds: No stridor. No wheezing, rhonchi or rales.  Abdominal:     General:  Abdomen is protuberant. Bowel sounds are normal. There is no distension.     Palpations: Abdomen is soft. There is no hepatomegaly, splenomegaly or mass.     Tenderness: There is no abdominal tenderness.  Musculoskeletal:        General: Normal range of motion.     Cervical back: Neck supple.     Right lower leg: No edema.     Left lower leg: No edema.  Lymphadenopathy:     Cervical: No cervical adenopathy.  Skin:    General: Skin is warm and dry.  Neurological:     General: No focal  deficit present.     Mental Status: He is alert.  Psychiatric:        Mood and Affect: Mood normal.        Behavior: Behavior normal.        Thought Content: Thought content normal.        Judgment: Judgment normal.    Lab Results  Component Value Date   WBC 7.2 02/26/2021   HGB 15.9 02/26/2021   HCT 46.0 02/26/2021   PLT 248.0 02/26/2021   GLUCOSE 97 06/07/2021   CHOL 194 02/26/2021   TRIG 261.0 (H) 02/26/2021   HDL 33.20 (L) 02/26/2021   LDLDIRECT 129.0 02/26/2021   LDLCALC 134 (H) 03/11/2019   ALT 55 (H) 02/26/2021   AST 26 02/26/2021   NA 140 06/07/2021   K 3.7 06/07/2021   CL 106 06/07/2021   CREATININE 1.05 06/07/2021   BUN 16 06/07/2021   CO2 28 06/07/2021   TSH 1.02 02/26/2021   HGBA1C 5.5 02/26/2021    No results found.  Assessment & Plan:   Mikiah was seen today for hypertension.  Diagnoses and all orders for this visit:  Essential hypertension- He has not achieved his blood pressure goal of 130/80.  This is mostly due to noncompliance.  Will check labs to screen for secondary causes and endorgan damage. -     Basic metabolic panel; Future -     Magnesium; Future -     Aldosterone + renin activity w/ ratio; Future -     Urine drugs of abuse scrn w alc, routine (Ref Lab); Future -     Discontinue: potassium chloride SA (KLOR-CON) 20 MEQ tablet; Take 1 tablet (20 mEq total) by mouth 2 (two) times daily. -     Discontinue: telmisartan-hydrochlorothiazide (MICARDIS HCT) 80-12.5 MG tablet; Take 1 tablet by mouth daily. -     potassium chloride SA (KLOR-CON) 20 MEQ tablet; Take 1 tablet (20 mEq total) by mouth 2 (two) times daily. -     telmisartan-hydrochlorothiazide (MICARDIS HCT) 80-12.5 MG tablet; Take 1 tablet by mouth daily. -     Urine drugs of abuse scrn w alc, routine (Ref Lab) -     Aldosterone + renin activity w/ ratio -     Magnesium -     Basic metabolic panel  Hypertensive left ventricular hypertrophy, without heart failure- See above. -      Basic metabolic panel; Future -     Magnesium; Future -     Aldosterone + renin activity w/ ratio; Future -     Urine drugs of abuse scrn w alc, routine (Ref Lab); Future -     Discontinue: telmisartan-hydrochlorothiazide (MICARDIS HCT) 80-12.5 MG tablet; Take 1 tablet by mouth daily. -     telmisartan-hydrochlorothiazide (MICARDIS HCT) 80-12.5 MG tablet; Take 1 tablet by mouth daily. -     Urine drugs  of abuse scrn w alc, routine (Ref Lab) -     Aldosterone + renin activity w/ ratio -     Magnesium -     Basic metabolic panel  Diuretic-induced hypokalemia -     Basic metabolic panel; Future -     Magnesium; Future -     Discontinue: potassium chloride SA (KLOR-CON) 20 MEQ tablet; Take 1 tablet (20 mEq total) by mouth 2 (two) times daily. -     potassium chloride SA (KLOR-CON) 20 MEQ tablet; Take 1 tablet (20 mEq total) by mouth 2 (two) times daily. -     Magnesium -     Basic metabolic panel  GAD (generalized anxiety disorder)- Will screen for substance abuse. -     Urine drugs of abuse scrn w alc, routine (Ref Lab); Future -     Discontinue: busPIRone (BUSPAR) 10 MG tablet; Take 1 tablet (10 mg total) by mouth 3 (three) times daily. -     busPIRone (BUSPAR) 10 MG tablet; Take 1 tablet (10 mg total) by mouth 3 (three) times daily. -     Urine drugs of abuse scrn w alc, routine (Ref Lab)  Prediabetes- Will continue the current dose of metformin. -     Discontinue: metFORMIN (GLUCOPHAGE) 500 MG tablet; Take 1 tablet (500 mg total) by mouth 2 (two) times daily with a meal. -     metFORMIN (GLUCOPHAGE) 500 MG tablet; Take 1 tablet (500 mg total) by mouth 2 (two) times daily with a meal.  Drug-induced erectile dysfunction -     Discontinue: sildenafil (REVATIO) 20 MG tablet; Take 1 tablet (20 mg total) by mouth daily as needed. -     sildenafil (REVATIO) 20 MG tablet; Take 1 tablet (20 mg total) by mouth daily as needed.  Other orders -     Flu Vaccine QUAD 6+ mos PF IM (Fluarix Quad  PF)  I am having Buryl L. Janicki maintain his Cholecalciferol, busPIRone, metFORMIN, potassium chloride SA, sildenafil, and telmisartan-hydrochlorothiazide.  Meds ordered this encounter  Medications   DISCONTD: metFORMIN (GLUCOPHAGE) 500 MG tablet    Sig: Take 1 tablet (500 mg total) by mouth 2 (two) times daily with a meal.    Dispense:  180 tablet    Refill:  0   DISCONTD: potassium chloride SA (KLOR-CON) 20 MEQ tablet    Sig: Take 1 tablet (20 mEq total) by mouth 2 (two) times daily.    Dispense:  180 tablet    Refill:  0   DISCONTD: sildenafil (REVATIO) 20 MG tablet    Sig: Take 1 tablet (20 mg total) by mouth daily as needed.    Dispense:  5 tablet    Refill:  2   DISCONTD: telmisartan-hydrochlorothiazide (MICARDIS HCT) 80-12.5 MG tablet    Sig: Take 1 tablet by mouth daily.    Dispense:  90 tablet    Refill:  0   DISCONTD: busPIRone (BUSPAR) 10 MG tablet    Sig: Take 1 tablet (10 mg total) by mouth 3 (three) times daily.    Dispense:  270 tablet    Refill:  0   busPIRone (BUSPAR) 10 MG tablet    Sig: Take 1 tablet (10 mg total) by mouth 3 (three) times daily.    Dispense:  270 tablet    Refill:  0   metFORMIN (GLUCOPHAGE) 500 MG tablet    Sig: Take 1 tablet (500 mg total) by mouth 2 (two) times daily with a meal.  Dispense:  180 tablet    Refill:  0   potassium chloride SA (KLOR-CON) 20 MEQ tablet    Sig: Take 1 tablet (20 mEq total) by mouth 2 (two) times daily.    Dispense:  180 tablet    Refill:  0   sildenafil (REVATIO) 20 MG tablet    Sig: Take 1 tablet (20 mg total) by mouth daily as needed.    Dispense:  5 tablet    Refill:  2   telmisartan-hydrochlorothiazide (MICARDIS HCT) 80-12.5 MG tablet    Sig: Take 1 tablet by mouth daily.    Dispense:  90 tablet    Refill:  0      Follow-up: Return in about 3 months (around 09/07/2021).  Scarlette Calico, MD

## 2021-06-07 NOTE — Patient Instructions (Signed)

## 2021-06-14 LAB — URINE DRUGS OF ABUSE SCREEN W ALC, ROUTINE (REF LAB)
Amphetamines, Urine: NEGATIVE ng/mL
Barbiturate Quant, Ur: NEGATIVE ng/mL
Benzodiazepine Quant, Ur: NEGATIVE ng/mL
Cocaine (Metab.): NEGATIVE ng/mL
Ethanol, Urine: NEGATIVE %
Methadone Screen, Urine: NEGATIVE ng/mL
Opiate Quant, Ur: NEGATIVE ng/mL
PCP Quant, Ur: NEGATIVE ng/mL
Propoxyphene: NEGATIVE ng/mL

## 2021-06-14 LAB — PANEL 799049
CARBOXY THC GC/MS CONF: 168 ng/mL
Cannabinoid GC/MS, Ur: POSITIVE — AB

## 2021-06-18 LAB — ALDOSTERONE + RENIN ACTIVITY W/ RATIO
ALDO / PRA Ratio: 7.2 Ratio (ref 0.9–28.9)
Aldosterone: 8 ng/dL
Renin Activity: 1.11 ng/mL/h (ref 0.25–5.82)

## 2021-08-07 DIAGNOSIS — M9902 Segmental and somatic dysfunction of thoracic region: Secondary | ICD-10-CM | POA: Diagnosis not present

## 2021-08-07 DIAGNOSIS — M9903 Segmental and somatic dysfunction of lumbar region: Secondary | ICD-10-CM | POA: Diagnosis not present

## 2021-08-07 DIAGNOSIS — M9904 Segmental and somatic dysfunction of sacral region: Secondary | ICD-10-CM | POA: Diagnosis not present

## 2021-08-07 DIAGNOSIS — M9901 Segmental and somatic dysfunction of cervical region: Secondary | ICD-10-CM | POA: Diagnosis not present

## 2021-08-27 ENCOUNTER — Other Ambulatory Visit: Payer: Self-pay | Admitting: Internal Medicine

## 2021-08-27 ENCOUNTER — Telehealth: Payer: Self-pay

## 2021-08-27 DIAGNOSIS — I10 Essential (primary) hypertension: Secondary | ICD-10-CM

## 2021-08-27 DIAGNOSIS — F411 Generalized anxiety disorder: Secondary | ICD-10-CM

## 2021-08-27 DIAGNOSIS — R7303 Prediabetes: Secondary | ICD-10-CM

## 2021-08-27 DIAGNOSIS — I119 Hypertensive heart disease without heart failure: Secondary | ICD-10-CM

## 2021-08-27 DIAGNOSIS — N522 Drug-induced erectile dysfunction: Secondary | ICD-10-CM

## 2021-08-27 MED ORDER — BUSPIRONE HCL 10 MG PO TABS: 10.0000 mg | ORAL_TABLET | Freq: Three times a day (TID) | ORAL | 0 refills | Status: DC

## 2021-08-27 MED ORDER — TELMISARTAN-HCTZ 80-12.5 MG PO TABS: 1.0000 | ORAL_TABLET | Freq: Every day | ORAL | 0 refills | Status: DC

## 2021-08-27 MED ORDER — METFORMIN HCL 500 MG PO TABS: 500.0000 mg | ORAL_TABLET | Freq: Two times a day (BID) | ORAL | 0 refills | Status: DC

## 2021-08-27 MED ORDER — SILDENAFIL CITRATE 20 MG PO TABS: 20.0000 mg | ORAL_TABLET | Freq: Every day | ORAL | 2 refills | Status: DC | PRN

## 2021-08-27 NOTE — Telephone Encounter (Addendum)
Pt calling in requesting a refill on: telmisartan-hydrochlorothiazide (MICARDIS HCT) 80-12.5 MG tablet metFORMIN (GLUCOPHAGE) 500 MG tablet busPIRone (BUSPAR) 10 MG tablet sildenafil (REVATIO) 20 MG tablet  Pharmacy: [facility] [address], [place] - [address]  LOV: 06/07/21 Pt scheduled a F/U appt for 10/15/21

## 2021-10-15 ENCOUNTER — Ambulatory Visit (INDEPENDENT_AMBULATORY_CARE_PROVIDER_SITE_OTHER): Payer: BC Managed Care – PPO | Admitting: Internal Medicine

## 2021-10-15 ENCOUNTER — Encounter: Payer: Self-pay | Admitting: Internal Medicine

## 2021-10-15 ENCOUNTER — Other Ambulatory Visit: Payer: Self-pay

## 2021-10-15 VITALS — BP 144/96 | HR 75 | Temp 98.5°F | Resp 16 | Ht 70.0 in | Wt 266.0 lb

## 2021-10-15 DIAGNOSIS — R7303 Prediabetes: Secondary | ICD-10-CM

## 2021-10-15 DIAGNOSIS — Z6838 Body mass index (BMI) 38.0-38.9, adult: Secondary | ICD-10-CM

## 2021-10-15 DIAGNOSIS — E876 Hypokalemia: Secondary | ICD-10-CM

## 2021-10-15 DIAGNOSIS — N522 Drug-induced erectile dysfunction: Secondary | ICD-10-CM

## 2021-10-15 DIAGNOSIS — I119 Hypertensive heart disease without heart failure: Secondary | ICD-10-CM

## 2021-10-15 DIAGNOSIS — I10 Essential (primary) hypertension: Secondary | ICD-10-CM

## 2021-10-15 DIAGNOSIS — T502X5A Adverse effect of carbonic-anhydrase inhibitors, benzothiadiazides and other diuretics, initial encounter: Secondary | ICD-10-CM

## 2021-10-15 DIAGNOSIS — E66812 Obesity, class 2: Secondary | ICD-10-CM | POA: Insufficient documentation

## 2021-10-15 LAB — BASIC METABOLIC PANEL
BUN: 15 mg/dL (ref 6–23)
CO2: 26 mEq/L (ref 19–32)
Calcium: 9.3 mg/dL (ref 8.4–10.5)
Chloride: 105 mEq/L (ref 96–112)
Creatinine, Ser: 1.2 mg/dL (ref 0.40–1.50)
GFR: 77.2 mL/min (ref >60.00)
Glucose, Bld: 101 mg/dL — ABNORMAL HIGH (ref 70–99)
Potassium: 3.4 mEq/L — ABNORMAL LOW (ref 3.5–5.1)
Sodium: 140 mEq/L (ref 135–145)

## 2021-10-15 LAB — CBC WITH DIFFERENTIAL/PLATELET
Basophils Absolute: 0.1 10*3/uL (ref 0.0–0.1)
Basophils Relative: 1.4 % (ref 0.0–3.0)
Eosinophils Absolute: 0 10*3/uL (ref 0.0–0.7)
Eosinophils Relative: 0.6 % (ref 0.0–5.0)
HCT: 41.7 % (ref 39.0–52.0)
Hemoglobin: 14.6 g/dL (ref 13.0–17.0)
Lymphocytes Relative: 27.7 % (ref 12.0–46.0)
Lymphs Abs: 1.9 10*3/uL (ref 0.7–4.0)
MCHC: 35 g/dL (ref 30.0–36.0)
MCV: 88.3 fl (ref 78.0–100.0)
Monocytes Absolute: 0.5 10*3/uL (ref 0.1–1.0)
Monocytes Relative: 7.8 % (ref 3.0–12.0)
Neutro Abs: 4.2 10*3/uL (ref 1.4–7.7)
Neutrophils Relative %: 62.5 % (ref 43.0–77.0)
Platelets: 242 10*3/uL (ref 150.0–400.0)
RBC: 4.72 Mil/uL (ref 4.22–5.81)
RDW: 13.1 % (ref 11.5–15.5)
WBC: 6.7 10*3/uL (ref 4.0–10.5)

## 2021-10-15 LAB — HEPATIC FUNCTION PANEL
ALT: 31 U/L (ref 0–53)
AST: 19 U/L (ref 0–37)
Albumin: 4.6 g/dL (ref 3.5–5.2)
Alkaline Phosphatase: 55 U/L (ref 39–117)
Bilirubin, Direct: 0.1 mg/dL (ref 0.0–0.3)
Total Bilirubin: 0.7 mg/dL (ref 0.2–1.2)
Total Protein: 7 g/dL (ref 6.0–8.3)

## 2021-10-15 MED ORDER — POTASSIUM CHLORIDE CRYS ER 20 MEQ PO TBCR: 20.0000 meq | EXTENDED_RELEASE_TABLET | Freq: Two times a day (BID) | ORAL | 1 refills | Status: DC

## 2021-10-15 MED ORDER — SEMAGLUTIDE-WEIGHT MANAGEMENT 0.5 MG/0.5ML ~~LOC~~ SOAJ: 0.5000 mg | SUBCUTANEOUS | 0 refills | Status: AC

## 2021-10-15 MED ORDER — TADALAFIL 20 MG PO TABS: 20.0000 mg | ORAL_TABLET | ORAL | 2 refills | Status: DC | PRN

## 2021-10-15 MED ORDER — SEMAGLUTIDE-WEIGHT MANAGEMENT 1.7 MG/0.75ML ~~LOC~~ SOAJ: 1.7000 mg | SUBCUTANEOUS | 0 refills | Status: AC

## 2021-10-15 MED ORDER — SEMAGLUTIDE-WEIGHT MANAGEMENT 1 MG/0.5ML ~~LOC~~ SOAJ: 1.0000 mg | SUBCUTANEOUS | 0 refills | Status: AC

## 2021-10-15 MED ORDER — SEMAGLUTIDE-WEIGHT MANAGEMENT 2.4 MG/0.75ML ~~LOC~~ SOAJ: 2.4000 mg | SUBCUTANEOUS | 0 refills | Status: AC

## 2021-10-15 MED ORDER — SEMAGLUTIDE-WEIGHT MANAGEMENT 0.25 MG/0.5ML ~~LOC~~ SOAJ: 0.2500 mg | SUBCUTANEOUS | 0 refills | Status: DC

## 2021-10-15 MED ORDER — METFORMIN HCL 500 MG PO TABS: 500.0000 mg | ORAL_TABLET | Freq: Two times a day (BID) | ORAL | 0 refills | Status: DC

## 2021-10-15 MED ORDER — INSULIN PEN NEEDLE 32G X 6 MM MISC: 1.0000 | 0 refills | Status: DC

## 2021-10-15 MED ORDER — TELMISARTAN-HCTZ 80-12.5 MG PO TABS: 1.0000 | ORAL_TABLET | Freq: Every day | ORAL | 0 refills | Status: DC

## 2021-10-15 NOTE — Progress Notes (Signed)
Subjective:  Patient ID: Cory Little, male    DOB: 08/13/1985  Age: 36 y.o. MRN: [id number]  CC: Hypertension  This visit occurred during the SARS-CoV-2 public health emergency.  Safety protocols were in place, including screening questions prior to the visit, additional usage of staff PPE, and extensive cleaning of exam room while observing appropriate contact time as indicated for disinfecting solutions.    HPI Cory Little presents for f/up -   He tells me his blood pressure has been well controlled.  He is active and denies headache, blurred vision, chest pain, or shortness of breath.  He wants to change his ED medication to generic Cialis.  He wants to try a GLP-1 agonist to help him lose weight.  Outpatient Medications Prior to Visit  Medication Sig Dispense Refill   busPIRone (BUSPAR) 10 MG tablet Take 1 tablet (10 mg total) by mouth 3 (three) times daily. 270 tablet 0   Cholecalciferol 50 MCG (2000 UT) TABS Take 1 tablet (2,000 Units total) by mouth daily. 90 tablet 1   metFORMIN (GLUCOPHAGE) 500 MG tablet Take 1 tablet (500 mg total) by mouth 2 (two) times daily with a meal. 180 tablet 0   potassium chloride SA (KLOR-CON) 20 MEQ tablet Take 1 tablet (20 mEq total) by mouth 2 (two) times daily. 180 tablet 0   sildenafil (REVATIO) 20 MG tablet Take 1 tablet (20 mg total) by mouth daily as needed. 5 tablet 2   telmisartan-hydrochlorothiazide (MICARDIS HCT) 80-12.5 MG tablet Take 1 tablet by mouth daily. 90 tablet 0   No facility-administered medications prior to visit.    ROS Review of Systems  Constitutional:  Positive for unexpected weight change (wt gain). Negative for diaphoresis and fatigue.  HENT: Negative.    Eyes: Negative.   Respiratory:  Positive for apnea. Negative for chest tightness, shortness of breath and wheezing.   Cardiovascular:  Negative for chest pain, palpitations and leg swelling.  Gastrointestinal: Negative.  Negative for abdominal pain.   Endocrine: Negative.   Genitourinary: Negative.   Musculoskeletal:  Positive for arthralgias and back pain. Negative for myalgias and neck pain.  Neurological: Negative.  Negative for dizziness, weakness and headaches.  Hematological:  Negative for adenopathy. Does not bruise/bleed easily.  Psychiatric/Behavioral: Negative.  Negative for dysphoric mood, sleep disturbance and suicidal ideas. The patient is not nervous/anxious.    Objective:  BP (!) 144/96 (BP Location: Left Arm, Patient Position: Sitting, Cuff Size: Large)   Pulse 75   Temp 98.5 F (36.9 C) (Oral)   Resp 16   Ht 5\' 10"  (1.778 m)   Wt 266 lb (120.7 kg)   SpO2 96%   BMI 38.17 kg/m   BP Readings from Last 3 Encounters:  10/15/21 (!) 144/96  06/07/21 (!) 156/102  02/26/21 (!) 146/106    Wt Readings from Last 3 Encounters:  10/15/21 266 lb (120.7 kg)  06/07/21 261 lb (118.4 kg)  02/26/21 264 lb (119.7 kg)    Physical Exam Vitals reviewed.  Constitutional:      Appearance: He is obese.  HENT:     Nose: Nose normal.     Mouth/Throat:     Mouth: Mucous membranes are moist.  Eyes:     General: No scleral icterus.    Conjunctiva/sclera: Conjunctivae normal.  Cardiovascular:     Rate and Rhythm: Normal rate and regular rhythm.     Heart sounds: No murmur heard. Pulmonary:     Effort: Pulmonary effort is normal.  Breath sounds: No stridor. No wheezing, rhonchi or rales.  Abdominal:     General: Abdomen is protuberant.     Palpations: There is no mass.     Tenderness: There is no abdominal tenderness. There is no guarding.     Hernia: No hernia is present.  Musculoskeletal:        General: Normal range of motion.     Cervical back: Neck supple.     Right lower leg: No edema.     Left lower leg: No edema.  Lymphadenopathy:     Cervical: No cervical adenopathy.  Skin:    General: Skin is warm and dry.  Neurological:     General: No focal deficit present.     Mental Status: He is alert.   Psychiatric:        Mood and Affect: Mood normal.        Behavior: Behavior normal.        Thought Content: Thought content normal.        Judgment: Judgment normal.    Lab Results  Component Value Date   WBC 6.7 10/15/2021   HGB 14.6 10/15/2021   HCT 41.7 10/15/2021   PLT 242.0 10/15/2021   GLUCOSE 101 (H) 10/15/2021   CHOL 194 02/26/2021   TRIG 261.0 (H) 02/26/2021   HDL 33.20 (L) 02/26/2021   LDLDIRECT 129.0 02/26/2021   LDLCALC 134 (H) 03/11/2019   ALT 31 10/15/2021   AST 19 10/15/2021   NA 140 10/15/2021   K 3.4 (L) 10/15/2021   CL 105 10/15/2021   CREATININE 1.20 10/15/2021   BUN 15 10/15/2021   CO2 26 10/15/2021   TSH 1.02 02/26/2021   HGBA1C 5.5 02/26/2021    No results found.  Assessment & Plan:   [name] was seen today for hypertension.  Diagnoses and all orders for this visit:  Class 2 severe obesity due to excess calories with serious comorbidity and body mass index (BMI) of 38.0 to 38.9 in adult (HCC) -     Semaglutide-Weight Management 0.25 MG/0.5ML SOAJ; Inject 0.25 mg into the skin once a week for 28 days. -     Semaglutide-Weight Management 0.5 MG/0.5ML SOAJ; Inject 0.5 mg into the skin once a week for 28 days. -     Semaglutide-Weight Management 1 MG/0.5ML SOAJ; Inject 1 mg into the skin once a week for 28 days. -     Semaglutide-Weight Management 1.7 MG/0.75ML SOAJ; Inject 1.7 mg into the skin once a week for 28 days. -     Semaglutide-Weight Management 2.4 MG/0.75ML SOAJ; Inject 2.4 mg into the skin once a week for 28 days. -     Insulin Pen Needle 32G X 6 MM MISC; 1 Act by Does not apply route once a week.  Prediabetes -     metFORMIN (GLUCOPHAGE) 500 MG tablet; Take 1 tablet (500 mg total) by mouth 2 (two) times daily with a meal. -     Basic metabolic panel; Future -     Basic metabolic panel  Essential hypertension- He has not achieved his blood pressure goal of 130/80.  Will continue the current antihypertensives and I have asked him to  improve his lifestyle modifications. -     telmisartan-hydrochlorothiazide (MICARDIS HCT) 80-12.5 MG tablet; Take 1 tablet by mouth daily. -     Basic metabolic panel; Future -     CBC with Differential/Platelet; Future -     Hepatic function panel; Future -     Hepatic  function panel -     CBC with Differential/Platelet -     Basic metabolic panel -     potassium chloride SA (KLOR-CON M) 20 MEQ tablet; Take 1 tablet (20 mEq total) by mouth 2 (two) times daily.  Hypertensive left ventricular hypertrophy, without heart failure -     telmisartan-hydrochlorothiazide (MICARDIS HCT) 80-12.5 MG tablet; Take 1 tablet by mouth daily. -     Basic metabolic panel; Future -     CBC with Differential/Platelet; Future -     Hepatic function panel; Future -     Hepatic function panel -     CBC with Differential/Platelet -     Basic metabolic panel  Drug-induced erectile dysfunction -     tadalafil (CIALIS) 20 MG tablet; Take 1 tablet (20 mg total) by mouth every three (3) days as needed for erectile dysfunction.  Diuretic-induced hypokalemia -     potassium chloride SA (KLOR-CON M) 20 MEQ tablet; Take 1 tablet (20 mEq total) by mouth 2 (two) times daily.   I have discontinued Beulah Gandy L. Wyche's sildenafil. I have also changed his potassium chloride SA. Additionally, I am having him start on Semaglutide-Weight Management, Semaglutide-Weight Management, Semaglutide-Weight Management, Semaglutide-Weight Management, Semaglutide-Weight Management, Insulin Pen Needle, and tadalafil. Lastly, I am having him maintain his Cholecalciferol, busPIRone, metFORMIN, and telmisartan-hydrochlorothiazide.  Meds ordered this encounter  Medications   metFORMIN (GLUCOPHAGE) 500 MG tablet    Sig: Take 1 tablet (500 mg total) by mouth 2 (two) times daily with a meal.    Dispense:  180 tablet    Refill:  0   telmisartan-hydrochlorothiazide (MICARDIS HCT) 80-12.5 MG tablet    Sig: Take 1 tablet by mouth daily.     Dispense:  90 tablet    Refill:  0   Semaglutide-Weight Management 0.25 MG/0.5ML SOAJ    Sig: Inject 0.25 mg into the skin once a week for 28 days.    Dispense:  2 mL    Refill:  0   Semaglutide-Weight Management 0.5 MG/0.5ML SOAJ    Sig: Inject 0.5 mg into the skin once a week for 28 days.    Dispense:  2 mL    Refill:  0   Semaglutide-Weight Management 1 MG/0.5ML SOAJ    Sig: Inject 1 mg into the skin once a week for 28 days.    Dispense:  2 mL    Refill:  0   Semaglutide-Weight Management 1.7 MG/0.75ML SOAJ    Sig: Inject 1.7 mg into the skin once a week for 28 days.    Dispense:  3 mL    Refill:  0   Semaglutide-Weight Management 2.4 MG/0.75ML SOAJ    Sig: Inject 2.4 mg into the skin once a week for 28 days.    Dispense:  3 mL    Refill:  0   Insulin Pen Needle 32G X 6 MM MISC    Sig: 1 Act by Does not apply route once a week.    Dispense:  50 each    Refill:  0   tadalafil (CIALIS) 20 MG tablet    Sig: Take 1 tablet (20 mg total) by mouth every three (3) days as needed for erectile dysfunction.    Dispense:  10 tablet    Refill:  2   potassium chloride SA (KLOR-CON M) 20 MEQ tablet    Sig: Take 1 tablet (20 mEq total) by mouth 2 (two) times daily.    Dispense:  180 tablet  Refill:  1     Follow-up: Return in about 6 months (around 04/17/2022).  Sanda Linger, MD

## 2021-10-15 NOTE — Patient Instructions (Signed)

## 2021-10-17 ENCOUNTER — Telehealth: Payer: Self-pay | Admitting: Internal Medicine

## 2021-10-17 NOTE — Telephone Encounter (Signed)
I have confirmed with the pt that Ozempic is an injection. He expressed understanding.  ?

## 2021-10-17 NOTE — Telephone Encounter (Signed)
Patient calling back in ? ?Advised patient of what provider stated regarding his high BP.. patient understood ? ?Also stated he thought provider was going to provide him the pill form of Ozempic he was unaware it was an injection.. please confirm & fu w/ patient [phone] ?

## 2021-10-17 NOTE — Telephone Encounter (Signed)
Patient calling in ? ?Patient had OV 03/20 ? ?Wants to know if provider can prescribe amphetamine-dextroamphetamine (ADDERALL) 10 MG tablet ? 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 ?Please fu [phone] ? ?Pharmacy ? ?[facility] [address] [place] - [address] Phone:  [phone]  ?Fax:  [phone]  ?  ? ?

## 2021-10-30 ENCOUNTER — Telehealth: Payer: Self-pay

## 2021-10-30 NOTE — Telephone Encounter (Signed)
Your prior authorization for Reginal Lutes has been approved! ? ?Start Date:09/30/2021; ?Coverage End Date:05/28/2022; ? ?Key: [id number] ?

## 2021-11-15 ENCOUNTER — Telehealth: Payer: Self-pay

## 2021-11-15 NOTE — Telephone Encounter (Signed)
Pt is requesting to start a medication to him focus as he is getting ready to start a class and feels he would benefit from that.  ? ?Please advise.  ? ?Pharmacy: ?[facility] [address], [place] - [address] ? ?Pt also wanted to report that the Ozempic is working great. ? ? ?

## 2021-11-16 NOTE — Telephone Encounter (Signed)
I advised pt of the recommendation and gave the website. ? ?No further questions at this time. ? ?FYI ?

## 2021-11-22 NOTE — Telephone Encounter (Signed)
NOTE NOT NEEDED ?

## 2021-12-12 ENCOUNTER — Other Ambulatory Visit: Payer: Self-pay | Admitting: Internal Medicine

## 2021-12-12 ENCOUNTER — Telehealth: Payer: Self-pay | Admitting: Internal Medicine

## 2021-12-12 NOTE — Telephone Encounter (Signed)
PT calls today in regards to getting a refill on their Ozempic [facility]) sent to the [facility] on [address]. PT states they were told that RX would be back in for 05/17 but was informed it was not in.  ? ?CB: [phone] ?

## 2021-12-13 NOTE — Telephone Encounter (Signed)
Pt requesting a call back from assistant regarding this message.

## 2021-12-13 NOTE — Telephone Encounter (Signed)
Pt has been informed that refill was sent on yesterday 5/17 to his pharmacy.

## 2021-12-25 ENCOUNTER — Telehealth: Payer: Self-pay

## 2021-12-25 NOTE — Telephone Encounter (Signed)
Key: [id number]

## 2021-12-26 NOTE — Telephone Encounter (Signed)
Per CoverMyMeds: ? ?PA was denied.  ?

## 2022-01-03 ENCOUNTER — Telehealth: Payer: Self-pay

## 2022-01-03 NOTE — Telephone Encounter (Signed)
Pt is requesting refill on: tadalafil (CIALIS) 20 MG tablet metFORMIN (GLUCOPHAGE) 500 MG tablet telmisartan-hydrochlorothiazide (MICARDIS HCT) 80-12.5 MG tablet  Pharmacy: [facility] [address] [place] - [id number]  LOV 10/15/21 ROV 04/17/22

## 2022-01-04 ENCOUNTER — Other Ambulatory Visit: Payer: Self-pay | Admitting: Internal Medicine

## 2022-01-04 DIAGNOSIS — N522 Drug-induced erectile dysfunction: Secondary | ICD-10-CM

## 2022-01-04 DIAGNOSIS — I10 Essential (primary) hypertension: Secondary | ICD-10-CM

## 2022-01-04 DIAGNOSIS — I119 Hypertensive heart disease without heart failure: Secondary | ICD-10-CM

## 2022-01-04 DIAGNOSIS — R7303 Prediabetes: Secondary | ICD-10-CM

## 2022-01-04 MED ORDER — METFORMIN HCL 500 MG PO TABS: 500.0000 mg | ORAL_TABLET | Freq: Two times a day (BID) | ORAL | 0 refills | Status: DC

## 2022-01-04 MED ORDER — TADALAFIL 20 MG PO TABS: 20.0000 mg | ORAL_TABLET | ORAL | 2 refills | Status: DC | PRN

## 2022-01-04 MED ORDER — TELMISARTAN-HCTZ 80-12.5 MG PO TABS: 1.0000 | ORAL_TABLET | Freq: Every day | ORAL | 0 refills | Status: DC

## 2022-01-13 ENCOUNTER — Other Ambulatory Visit: Payer: Self-pay | Admitting: Internal Medicine

## 2022-04-03 ENCOUNTER — Ambulatory Visit (HOSPITAL_COMMUNITY)
Admission: EM | Admit: 2022-04-03 | Discharge: 2022-04-03 | Disposition: A | Discharge status: Home / Self Care | Payer: Managed Care, Other (non HMO) | Attending: Family Medicine | Admitting: Family Medicine

## 2022-04-03 ENCOUNTER — Encounter (HOSPITAL_COMMUNITY): Payer: Self-pay

## 2022-04-03 DIAGNOSIS — J069 Acute upper respiratory infection, unspecified: Secondary | ICD-10-CM

## 2022-04-03 DIAGNOSIS — Z20822 Contact with and (suspected) exposure to covid-19: Secondary | ICD-10-CM | POA: Insufficient documentation

## 2022-04-03 DIAGNOSIS — R059 Cough, unspecified: Secondary | ICD-10-CM | POA: Insufficient documentation

## 2022-04-03 LAB — RESP PANEL BY RT-PCR (FLU A&B, COVID) ARPGX2
Influenza A by PCR: NEGATIVE
Influenza B by PCR: NEGATIVE
SARS Coronavirus 2 by RT PCR: NEGATIVE

## 2022-04-03 MED ORDER — HYDROCODONE BIT-HOMATROP MBR 5-1.5 MG/5ML PO SOLN: 5.0000 mL | Freq: Four times a day (QID) | ORAL | 0 refills | Status: DC | PRN

## 2022-04-03 NOTE — Discharge Instructions (Signed)
You have been tested for COVID-19 today. °If your test returns positive, you will receive a phone call from [facility] regarding your results. °Negative test results are not called. °Both positive and negative results area always visible on MyChart. °If you do not have a MyChart account, sign up instructions are provided in your discharge papers. °Please do not hesitate to contact us should you have questions or concerns. ° °

## 2022-04-03 NOTE — ED Provider Notes (Signed)
  [facility]   [id number] 04/03/22 Arrival Time: 1119  ASSESSMENT & PLAN:  1. Viral URI with cough    Discussed typical duration of viral illnesses. COVID/influenza testing pending. Work note provided. OTC symptom care as needed.  New Prescriptions   HYDROCODONE BIT-HOMATROPINE (HYCODAN) 5-1.5 MG/5ML SYRUP    Take 5 mLs by mouth every 6 (six) hours as needed for cough.     Follow-up Information     Etta Grandchild, MD.   Specialty: Internal Medicine Why: As needed. Contact information: [address] [place] [address] [phone]                 Reviewed expectations re: course of current medical issues. Questions answered. Outlined signs and symptoms indicating need for more acute intervention. Understanding verbalized. After Visit Summary given.   SUBJECTIVE: History from: Patient. Cory Little is a 36 y.o. male. Reports: cough, nasal congestion, fatigue, body aches; abrupt onset; x 3-4 days; sick co-worker. Has felt feverish. Denies: difficulty breathing. Normal PO intake without n/v/d.  OBJECTIVE:  Vitals:   04/03/22 1247 04/03/22 1248  BP:  (!) 147/108  Pulse:  70  Resp:  12  Temp:  99.1 F (37.3 C)  TempSrc:  Oral  SpO2:  97%  Weight: 120.7 kg     General appearance: alert; no distress; appears fatigued Eyes: PERRLA; EOMI; conjunctiva normal HENT: [place]; AT; with nasal congestion Neck: supple  Lungs: speaks full sentences without difficulty; unlabored Extremities: no edema Skin: warm and dry Neurologic: normal gait Psychological: alert and cooperative; normal mood and affect  Labs:  Labs Reviewed  RESP PANEL BY RT-PCR (FLU A&B, COVID) [id number]     Allergies  Allergen Reactions   Wellbutrin [Bupropion] Other (See Comments)    tremors    Past Medical History:  Diagnosis Date   ADD (attention deficit disorder)    Chest pain    Chest tightness    Depression    GERD (gastroesophageal reflux disease)     Hypertension    Intolerance to cold    Kidney stone 2004   Sleep apnea    Social History   Socioeconomic History   Marital status: Single    Spouse name: Not on file   Number of children: Not on file   Years of education: Not on file   Highest education level: Not on file  Occupational History   Not on file  Tobacco Use   Smoking status: Former    Types: Cigarettes   Smokeless tobacco: Never  Substance and Sexual Activity   Alcohol use: No    Comment: socially   Drug use: Yes    Types: Marijuana   Sexual activity: Yes    Birth control/protection: Condom  Other Topics Concern   Not on file  Social History Narrative   Not on file   Social Determinants of Health   Financial Resource Strain: Not on file  Food Insecurity: Not on file  Transportation Needs: Not on file  Physical Activity: Not on file  Stress: Not on file  Social Connections: Not on file  Intimate Partner Violence: Not on file   Family History  Problem Relation Age of Onset   Sleep apnea Father    Depression Mother    Past Surgical History:  Procedure Laterality Date   facial liposuction     KIDNEY STONE SURGERY     WISDOM TOOTH EXTRACTION  2016     Mardella Layman, MD 04/03/22 1458

## 2022-04-03 NOTE — ED Triage Notes (Signed)
Pt is here for sore throat, nasal congestion and sore throat 3-4 days. Pt denies fever

## 2022-04-17 ENCOUNTER — Ambulatory Visit: Payer: BC Managed Care – PPO | Admitting: Internal Medicine

## 2022-05-07 ENCOUNTER — Telehealth: Payer: Self-pay | Admitting: Internal Medicine

## 2022-05-07 NOTE — Telephone Encounter (Signed)
Pt is requesting refills for: metFORMIN (GLUCOPHAGE) 500 MG tablet tadalafil (CIALIS) 20 MG tablet telmisartan-hydrochlorothiazide (MICARDIS HCT) 80-12.5 MG tablet  Last OV 3.20.23  Please send refills to [facility]   Phone:  [phone]  Fax:  [phone]

## 2022-06-15 ENCOUNTER — Encounter (HOSPITAL_COMMUNITY): Payer: Self-pay | Admitting: *Deleted

## 2022-06-15 ENCOUNTER — Other Ambulatory Visit: Payer: Self-pay

## 2022-06-15 ENCOUNTER — Ambulatory Visit (HOSPITAL_COMMUNITY)
Admission: EM | Admit: 2022-06-15 | Discharge: 2022-06-15 | Disposition: A | Discharge status: Home / Self Care | Payer: Managed Care, Other (non HMO)

## 2022-06-15 DIAGNOSIS — J101 Influenza due to other identified influenza virus with other respiratory manifestations: Secondary | ICD-10-CM

## 2022-06-15 HISTORY — DX: Prediabetes: R73.03

## 2022-06-15 LAB — POC INFLUENZA A AND B ANTIGEN (URGENT CARE ONLY)
INFLUENZA A ANTIGEN, POC: POSITIVE — AB
INFLUENZA B ANTIGEN, POC: NEGATIVE

## 2022-06-15 MED ORDER — OSELTAMIVIR PHOSPHATE 75 MG PO CAPS: 75.0000 mg | ORAL_CAPSULE | Freq: Two times a day (BID) | ORAL | 0 refills | Status: AC

## 2022-06-15 NOTE — Discharge Instructions (Addendum)
You can take the tamiflu as prescribed to help shorten duration of symptoms. It should be about $10 at the pharmacy.  Otherwise symptomatic care with lots of fluids, tylenol or ibuprofen, mucinex

## 2022-06-15 NOTE — ED Provider Notes (Signed)
[facility]    CSN: [id number] Arrival date & time: 06/15/22  1407      History   Chief Complaint Chief Complaint  Patient presents with   Fever    HPI Cory Little is a 36 y.o. male.  2 day history of flu like symptoms  Tmax 102 this morning Chills and body aches Nasal congestion, mild cough No GI symptoms  Tried alka seltzer cold&flu that helped a little  Denies known sick contacts   Past Medical History:  Diagnosis Date   Hypertension    Kidney stone    Pre-diabetes     There are no problems to display for this patient.   Past Surgical History:  Procedure Laterality Date   FACIAL COSMETIC SURGERY     KIDNEY STONE SURGERY         Home Medications    Prior to Admission medications   Medication Sig Start Date End Date Taking? Authorizing Provider  METFORMIN HCL PO Take by mouth.   Yes [provider]  oseltamivir (TAMIFLU) 75 MG capsule Take 1 capsule (75 mg total) by mouth 2 (two) times daily for 5 days. 06/15/22 06/20/22 Yes Makeila Yamaguchi, PA-C  TELMISARTAN PO Take by mouth.   Yes [provider]    Family History History reviewed. No pertinent family history.  Social History Social History   Tobacco Use   Smoking status: Former    Types: Cigarettes  Building services engineer Use: Never used  Substance Use Topics   Alcohol use: Yes    Comment: occasionally   Drug use: Never     Allergies   Patient has no known allergies.   Review of Systems Review of Systems Per HPI  Physical Exam Triage Vital Signs ED Triage Vitals  Enc Vitals Group     BP 06/15/22 1536 (!) 154/97     Pulse Rate 06/15/22 1536 93     Resp 06/15/22 1536 18     Temp 06/15/22 1536 98.2 F (36.8 C)     Temp Source 06/15/22 1536 Oral     SpO2 06/15/22 1536 97 %     Weight --      Height --      Head Circumference --      Peak Flow --      Pain Score 06/15/22 1538 0     Pain Loc --      Pain Edu? --      Excl. in [place]? --    No  data found.  Updated Vital Signs BP (!) 154/97 Comment: has not taken HTN med today  Pulse 93   Temp 98.2 F (36.8 C) (Oral)   Resp 18   SpO2 97%    Physical Exam Vitals and nursing note reviewed.  Constitutional:      General: He is not in acute distress.    Appearance: He is ill-appearing.  HENT:     Nose: Congestion present.     Mouth/Throat:     Mouth: Mucous membranes are moist.     Pharynx: Oropharynx is clear. No oropharyngeal exudate or posterior oropharyngeal erythema.  Cardiovascular:     Rate and Rhythm: Normal rate and regular rhythm.     Pulses: Normal pulses.     Heart sounds: Normal heart sounds.  Pulmonary:     Effort: Pulmonary effort is normal.     Breath sounds: Normal breath sounds.  Musculoskeletal:        General: Normal range of  motion.  Lymphadenopathy:     Cervical: No cervical adenopathy.  Skin:    General: Skin is warm and dry.  Neurological:     Mental Status: He is alert and oriented to person, place, and time.      UC Treatments / Results  Labs (all labs ordered are listed, but only abnormal results are displayed) Labs Reviewed  POC INFLUENZA A AND B ANTIGEN (URGENT CARE ONLY) - Abnormal; Notable for the following components:      Result Value   INFLUENZA A ANTIGEN, POC POSITIVE (*)    All other components within normal limits    EKG   Radiology No results found.  Procedures Procedures (including critical care time)  Medications Ordered in UC Medications - No data to display  Initial Impression / Assessment and Plan / UC Course  I have reviewed the triage vital signs and the nursing notes.  Pertinent labs & imaging results that were available during my care of the patient were reviewed by me and considered in my medical decision making (see chart for details).  Flu positive Tamiflu BID x 5 days Other symptomatic care discussed Work note provided Return precautions discussed. Patient agrees to plan  Final Clinical  Impressions(s) / UC Diagnoses   Final diagnoses:  Influenza A     Discharge Instructions      You can take the tamiflu as prescribed to help shorten duration of symptoms. It should be about $10 at the pharmacy.  Otherwise symptomatic care with lots of fluids, tylenol or ibuprofen, mucinex     ED Prescriptions     Medication Sig Dispense Auth. Provider   oseltamivir (TAMIFLU) 75 MG capsule Take 1 capsule (75 mg total) by mouth 2 (two) times daily for 5 days. 10 capsule Undra Trembath, Lurena Joiner, PA-C      PDMP not reviewed this encounter.   Marlow Baars, [place] 06/15/22 1631

## 2022-06-15 NOTE — ED Triage Notes (Signed)
C/O Fevers up to 102, body aches, chills, cough onset 2 nights ago. Has been taking Alka Seltzer.

## 2022-06-17 ENCOUNTER — Encounter (HOSPITAL_COMMUNITY): Payer: Self-pay | Admitting: *Deleted

## 2022-08-07 ENCOUNTER — Other Ambulatory Visit: Payer: Self-pay | Admitting: Internal Medicine

## 2022-08-07 ENCOUNTER — Telehealth: Payer: Self-pay | Admitting: *Deleted

## 2022-08-07 DIAGNOSIS — R7303 Prediabetes: Secondary | ICD-10-CM

## 2022-08-07 DIAGNOSIS — I119 Hypertensive heart disease without heart failure: Secondary | ICD-10-CM

## 2022-08-07 DIAGNOSIS — I10 Essential (primary) hypertension: Secondary | ICD-10-CM

## 2022-08-07 NOTE — Telephone Encounter (Signed)
MEDICATION: metFORMIN (GLUCOPHAGE) 500 MG tablet  telmisartan-hydrochlorothiazide (MICARDIS HCT) 80-12.5 MG tablet   PHARMACY: [facility] [address]  Comments: Patient is out of telmisartan. He said he is in between insurances and can't schedule until march  **Let patient know to contact pharmacy at the end of the day to make sure medication is ready. **  ** Please notify patient to allow 48-72 hours to process**  **Encourage patient to contact the pharmacy for refills or they can request refills through [facility]**

## 2022-08-08 NOTE — Telephone Encounter (Signed)
MEDICATION: metFORMIN (GLUCOPHAGE) 500 MG tablet  telmisartan-hydrochlorothiazide (MICARDIS HCT) 80-12.5 MG tablet    PHARMACY: [facility] [address]     Pt states he is out of telmisartan. He said he is in between insurances and can't schd an apptmnt at this time.

## 2022-08-09 NOTE — Telephone Encounter (Signed)
I was able to inform the pt of provider's response and the pt states he is in between insurances and can not make an apptmnt to come in right now.

## 2022-10-07 ENCOUNTER — Ambulatory Visit (HOSPITAL_COMMUNITY)
Admission: EM | Admit: 2022-10-07 | Discharge: 2022-10-07 | Disposition: A | Discharge status: Home / Self Care | Payer: Managed Care, Other (non HMO) | Attending: Family Medicine | Admitting: Family Medicine

## 2022-10-07 ENCOUNTER — Encounter (HOSPITAL_COMMUNITY): Payer: Self-pay

## 2022-10-07 DIAGNOSIS — I119 Hypertensive heart disease without heart failure: Secondary | ICD-10-CM

## 2022-10-07 DIAGNOSIS — I10 Essential (primary) hypertension: Secondary | ICD-10-CM

## 2022-10-07 DIAGNOSIS — E119 Type 2 diabetes mellitus without complications: Secondary | ICD-10-CM

## 2022-10-07 MED ORDER — TELMISARTAN-HCTZ 80-12.5 MG PO TABS: 1.0000 | ORAL_TABLET | Freq: Every day | ORAL | 2 refills | Status: DC

## 2022-10-07 MED ORDER — METFORMIN HCL 500 MG PO TABS: 500.0000 mg | ORAL_TABLET | Freq: Two times a day (BID) | ORAL | 0 refills | Status: DC

## 2022-10-07 NOTE — ED Provider Notes (Signed)
[place]    CSN: [id number] Arrival date & time: 10/07/22  1434      History   Chief Complaint Chief Complaint  Patient presents with   Medication Refill   Hypertension    HPI Cory Little is a 37 y.o. male.   Patient is here for elevated blood pressure.  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  He lost his insurance, and his dr would not fill his script unless he was seen, but could not afford that.  He has been out of bp medication for a little over a month.  He is feeling well.  No headache, chest pain or sob.  No dizziness or light headedness.   He has been out of all his meds.        Past Medical History:  Diagnosis Date   ADD (attention deficit disorder)    Chest pain    Chest tightness    Depression    GERD (gastroesophageal reflux disease)    Hypertension    Intolerance to cold    Kidney stone 2004   Kidney stone    Pre-diabetes    Sleep apnea     Patient Active Problem List   Diagnosis Date Noted   Class 2 severe obesity due to excess calories with serious comorbidity and body mass index (BMI) of 38.0 to 38.9 in adult [facility]) 10/15/2021   Encounter for general adult medical examination with abnormal findings 02/26/2021   GAD (generalized anxiety disorder) 02/26/2021   Drug-induced erectile dysfunction 06/22/2019   Routine general medical examination at a health care facility 03/11/2019   Diuretic-induced hypokalemia 03/11/2019   Current moderate episode of major depressive disorder ([place]) 01/06/2018   Vitamin D deficiency 09/22/2017   Obstructive sleep apnea syndrome 09/08/2017   Marijuana abuse 08/21/2017   Essential hypertension 08/14/2017   Hypertensive left ventricular hypertrophy, without heart failure 08/14/2017   Class 2 severe obesity due to excess calories with serious comorbidity and body mass index (BMI) of 37.0 to 37.9 in adult ([place]) 04/16/2017   OSA on CPAP 11/23/2015   Attention deficit  hyperactivity disorder (ADHD) 03/06/2010   GERD 02/17/2009   Insomnia with sleep apnea 02/16/2009    Past Surgical History:  Procedure Laterality Date   FACIAL COSMETIC SURGERY     facial liposuction     [place] EXTRACTION  2016       Home Medications    Prior to Admission medications   Medication Sig Start Date End Date Taking? Authorizing Provider  busPIRone (BUSPAR) 10 MG tablet Take 1 tablet (10 mg total) by mouth 3 (three) times daily. 08/27/21   Janith Lima, MD  Cholecalciferol 50 MCG (2000 UT) TABS Take 1 tablet (2,000 Units total) by mouth daily. 03/11/19   Janith Lima, MD  HYDROcodone bit-homatropine (HYCODAN) 5-1.5 MG/5ML syrup Take 5 mLs by mouth every 6 (six) hours as needed for cough. 04/03/22   Vanessa Kick, MD  Insulin Pen Needle 32G X 6 MM MISC 1 Act by Does not apply route once a week. 10/15/21   Janith Lima, MD  metFORMIN (GLUCOPHAGE) 500 MG tablet Take 1 tablet (500 mg total) by mouth 2 (two) times daily with a meal. 01/04/22   Janith Lima, MD  METFORMIN HCL PO Take by mouth.    [provider]  potassium chloride SA (KLOR-CON M) 20 MEQ tablet Take 1 tablet (20  mEq total) by mouth 2 (two) times daily. 10/15/21   Janith Lima, MD  tadalafil (CIALIS) 20 MG tablet Take 1 tablet (20 mg total) by mouth every three (3) days as needed for erectile dysfunction. 01/04/22   Janith Lima, MD  TELMISARTAN PO Take by mouth.    [provider]  telmisartan-hydrochlorothiazide (MICARDIS HCT) 80-12.5 MG tablet Take 1 tablet by mouth daily. 01/04/22   Janith Lima, MD  WEGOVY 0.25 MG/0.5ML SOAJ INJECT 0.'25MG'$  INTO THE SKIN ONCE A WEEK FOR 28 DAYS 01/13/22   Janith Lima, MD    Family History Family History  Problem Relation Age of Onset   Sleep apnea Father    Depression Mother     Social History Social History   Tobacco Use   Smoking status: Former    Types: Cigarettes   Smokeless tobacco: Never  Vaping  Use   Vaping Use: Never used  Substance Use Topics   Alcohol use: Yes    Comment: occasionally   Drug use: Never     Allergies   Wellbutrin [bupropion]   Review of Systems Review of Systems  Constitutional: Negative.   HENT: Negative.    Respiratory: Negative.    Cardiovascular: Negative.   Gastrointestinal: Negative.   Musculoskeletal: Negative.   Psychiatric/Behavioral: Negative.       Physical Exam Triage Vital Signs ED Triage Vitals  Enc Vitals Group     BP 10/07/22 1527 (!) 164/118     Pulse Rate 10/07/22 1527 82     Resp 10/07/22 1527 18     Temp 10/07/22 1527 98.4 F (36.9 C)     Temp Source 10/07/22 1527 Oral     SpO2 10/07/22 1527 96 %     Weight --      Height --      Head Circumference --      Peak Flow --      Pain Score 10/07/22 1530 0     Pain Loc --      Pain Edu? --      Excl. in [place]? --    No data found.  Updated Vital Signs BP (!) 164/118 (BP Location: Left Arm)   Pulse 82   Temp 98.4 F (36.9 C) (Oral)   Resp 18   SpO2 96%   Visual Acuity Right Eye Distance:   Left Eye Distance:   Bilateral Distance:    Right Eye Near:   Left Eye Near:    Bilateral Near:     Physical Exam Constitutional:      Appearance: Normal appearance.  Cardiovascular:     Rate and Rhythm: Normal rate.  Pulmonary:     Effort: Pulmonary effort is normal.  Skin:    General: Skin is warm.  Neurological:     General: No focal deficit present.     Mental Status: He is alert.  Psychiatric:        Mood and Affect: Mood normal.      UC Treatments / Results  Labs (all labs ordered are listed, but only abnormal results are displayed) Labs Reviewed - No data to display  EKG   Radiology No results found.  Procedures Procedures (including critical care time)  Medications Ordered in UC Medications - No data to display  Initial Impression / Assessment and Plan / UC Course  I have reviewed the triage vital signs and the nursing  notes.  Pertinent labs & imaging results that were available during my care  of the patient were reviewed by me and considered in my medical decision making (see chart for details).    Patient is here for refill of medication.  His bp is elevated today, but without other worrisome signs/symptoms.  He does not have insurance at this time.  Will refill medications x 90 days today in order to have time to find a pcp. No labs drawn as he is without insurance.  If he needs further refills he will need to return for re-evaluation.   Final diagnoses:  Essential hypertension, benign  Type 2 diabetes mellitus without complication, without long-term current use of insulin ([place])     Discharge Instructions      You were seen today for refill of medications.  I have given you a 90 day supply to have time to find a new provider when you have insurance.  Please return if needed.     ED Prescriptions     Medication Sig Dispense Auth. Provider   metFORMIN (GLUCOPHAGE) 500 MG tablet Take 1 tablet (500 mg total) by mouth 2 (two) times daily with a meal. 180 tablet Aariz Maish, MD   telmisartan-hydrochlorothiazide (MICARDIS HCT) 80-12.5 MG tablet Take 1 tablet by mouth daily. 30 tablet Rondel Oh, MD      PDMP not reviewed this encounter.   Rondel Oh, MD 10/07/22 1553

## 2022-10-07 NOTE — Discharge Instructions (Signed)
You were seen today for refill of medications.  I have given you a 90 day supply to have time to find a new provider when you have insurance.  Please return if needed.

## 2022-10-07 NOTE — ED Triage Notes (Signed)
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 Patient states he has not had his BP meds in 2 months due to having no insurance.

## 2022-12-30 ENCOUNTER — Ambulatory Visit: Payer: Commercial Managed Care - PPO | Admitting: Internal Medicine

## 2022-12-30 ENCOUNTER — Encounter: Payer: Self-pay | Admitting: Internal Medicine

## 2022-12-30 VITALS — BP 158/100 | HR 81 | Temp 98.3°F | Resp 16 | Ht 70.0 in | Wt 272.0 lb

## 2022-12-30 DIAGNOSIS — T502X5A Adverse effect of carbonic-anhydrase inhibitors, benzothiadiazides and other diuretics, initial encounter: Secondary | ICD-10-CM

## 2022-12-30 DIAGNOSIS — I10 Essential (primary) hypertension: Secondary | ICD-10-CM

## 2022-12-30 DIAGNOSIS — F1210 Cannabis abuse, uncomplicated: Secondary | ICD-10-CM

## 2022-12-30 DIAGNOSIS — I119 Hypertensive heart disease without heart failure: Secondary | ICD-10-CM | POA: Diagnosis not present

## 2022-12-30 DIAGNOSIS — Z6837 Body mass index (BMI) 37.0-37.9, adult: Secondary | ICD-10-CM | POA: Diagnosis not present

## 2022-12-30 DIAGNOSIS — Z0000 Encounter for general adult medical examination without abnormal findings: Secondary | ICD-10-CM | POA: Diagnosis not present

## 2022-12-30 DIAGNOSIS — N522 Drug-induced erectile dysfunction: Secondary | ICD-10-CM

## 2022-12-30 DIAGNOSIS — R9431 Abnormal electrocardiogram [ECG] [EKG]: Secondary | ICD-10-CM

## 2022-12-30 DIAGNOSIS — Z0001 Encounter for general adult medical examination with abnormal findings: Secondary | ICD-10-CM

## 2022-12-30 DIAGNOSIS — E876 Hypokalemia: Secondary | ICD-10-CM

## 2022-12-30 DIAGNOSIS — R079 Chest pain, unspecified: Secondary | ICD-10-CM | POA: Insufficient documentation

## 2022-12-30 LAB — CBC WITH DIFFERENTIAL/PLATELET
Basophils Absolute: 0.1 10*3/uL (ref 0.0–0.1)
Basophils Relative: 1.8 % (ref 0.0–3.0)
Eosinophils Absolute: 0.2 10*3/uL (ref 0.0–0.7)
Eosinophils Relative: 2.7 % (ref 0.0–5.0)
HCT: 42.3 % (ref 39.0–52.0)
Hemoglobin: 14.6 g/dL (ref 13.0–17.0)
Lymphocytes Relative: 31.1 % (ref 12.0–46.0)
Lymphs Abs: 2.3 10*3/uL (ref 0.7–4.0)
MCHC: 34.5 g/dL (ref 30.0–36.0)
MCV: 86.3 fl (ref 78.0–100.0)
Monocytes Absolute: 0.6 10*3/uL (ref 0.1–1.0)
Monocytes Relative: 8.4 % (ref 3.0–12.0)
Neutro Abs: 4.1 10*3/uL (ref 1.4–7.7)
Neutrophils Relative %: 56 % (ref 43.0–77.0)
Platelets: 251 10*3/uL (ref 150.0–400.0)
RBC: 4.9 Mil/uL (ref 4.22–5.81)
RDW: 13.3 % (ref 11.5–15.5)
WBC: 7.3 10*3/uL (ref 4.0–10.5)

## 2022-12-30 LAB — BASIC METABOLIC PANEL
BUN: 15 mg/dL (ref 6–23)
CO2: 28 mEq/L (ref 19–32)
Calcium: 9.2 mg/dL (ref 8.4–10.5)
Chloride: 104 mEq/L (ref 96–112)
Creatinine, Ser: 1.13 mg/dL (ref 0.40–1.50)
GFR: 82.27 mL/min (ref >60.00)
Glucose, Bld: 119 mg/dL — ABNORMAL HIGH (ref 70–99)
Potassium: 3 mEq/L — ABNORMAL LOW (ref 3.5–5.1)
Sodium: 141 mEq/L (ref 135–145)

## 2022-12-30 LAB — LIPID PANEL
Cholesterol: 214 mg/dL — ABNORMAL HIGH (ref 0–200)
HDL: 37.4 mg/dL — ABNORMAL LOW (ref >39.00)
NonHDL: 176.72
Total CHOL/HDL Ratio: 6
Triglycerides: 206 mg/dL — ABNORMAL HIGH (ref 0.0–149.0)
VLDL: 41.2 mg/dL — ABNORMAL HIGH (ref 0.0–40.0)

## 2022-12-30 LAB — LDL CHOLESTEROL, DIRECT: Direct LDL: 159 mg/dL

## 2022-12-30 LAB — URINALYSIS, ROUTINE W REFLEX MICROSCOPIC
Bilirubin Urine: NEGATIVE
Ketones, ur: NEGATIVE
Leukocytes,Ua: NEGATIVE
Nitrite: NEGATIVE
Specific Gravity, Urine: >=1.03 — AB (ref 1.000–1.030)
Total Protein, Urine: NEGATIVE
Urine Glucose: NEGATIVE
Urobilinogen, UA: 0.2 (ref 0.0–1.0)
pH: 6 (ref 5.0–8.0)

## 2022-12-30 LAB — TSH: TSH: 0.77 u[IU]/mL (ref 0.35–5.50)

## 2022-12-30 LAB — HEPATIC FUNCTION PANEL
ALT: 37 U/L (ref 0–53)
AST: 21 U/L (ref 0–37)
Albumin: 4.3 g/dL (ref 3.5–5.2)
Alkaline Phosphatase: 62 U/L (ref 39–117)
Bilirubin, Direct: 0.1 mg/dL (ref 0.0–0.3)
Total Bilirubin: 0.6 mg/dL (ref 0.2–1.2)
Total Protein: 7 g/dL (ref 6.0–8.3)

## 2022-12-30 LAB — HEMOGLOBIN A1C: Hgb A1c MFr Bld: 5.6 % (ref 4.6–6.5)

## 2022-12-30 LAB — TROPONIN I (HIGH SENSITIVITY): High Sens Troponin I: 6 ng/L (ref 2–17)

## 2022-12-30 MED ORDER — POTASSIUM CHLORIDE ER 10 MEQ PO TBCR: 10.0000 meq | EXTENDED_RELEASE_TABLET | Freq: Two times a day (BID) | ORAL | 0 refills | Status: DC

## 2022-12-30 MED ORDER — TRIAMTERENE-HCTZ 37.5-25 MG PO CAPS: 1.0000 | ORAL_CAPSULE | Freq: Every day | ORAL | 0 refills | Status: DC

## 2022-12-30 MED ORDER — TELMISARTAN 40 MG PO TABS: 40.0000 mg | ORAL_TABLET | Freq: Every day | ORAL | 0 refills | Status: DC

## 2022-12-30 NOTE — Progress Notes (Signed)
Subjective:  Patient ID: Cory Little, male    DOB: Jun 25, 1986  Age: 37 y.o. MRN: [id number]  CC: Annual Exam and Hypertension   HPI Cory Little presents for a CPX and f/up ----   He is compliant with telmisartan and HCTZ but admits that he has not been exercising.  He denies headache, blurred vision, shortness of breath, or edema.   Outpatient Medications Prior to Visit  Medication Sig Dispense Refill   busPIRone (BUSPAR) 10 MG tablet Take 1 tablet (10 mg total) by mouth 3 (three) times daily. 270 tablet 0   Insulin Pen Needle 32G X 6 MM MISC 1 Act by Does not apply route once a week. 50 each 0   metFORMIN (GLUCOPHAGE) 500 MG tablet Take 1 tablet (500 mg total) by mouth 2 (two) times daily with a meal. 180 tablet 0   telmisartan-hydrochlorothiazide (MICARDIS HCT) 80-12.5 MG tablet Take 1 tablet by mouth daily. 30 tablet 2   No facility-administered medications prior to visit.    ROS Review of Systems  Constitutional:  Positive for unexpected weight change (wt gain). Negative for chills, diaphoresis and fatigue.  HENT: Negative.    Eyes: Negative.   Respiratory:  Positive for apnea. Negative for cough, shortness of breath and wheezing.   Cardiovascular:  Positive for chest pain. Negative for palpitations and leg swelling.       He has CP at rest  Gastrointestinal:  Negative for abdominal pain, diarrhea, nausea and vomiting.  Endocrine: Negative.   Genitourinary: Negative.  Negative for difficulty urinating, hematuria, scrotal swelling and testicular pain.       ++ED  Musculoskeletal: Negative.  Negative for arthralgias and myalgias.  Skin: Negative.  Negative for color change and pallor.  Neurological:  Negative for dizziness, weakness and headaches.  Hematological:  Negative for adenopathy. Does not bruise/bleed easily.  Psychiatric/Behavioral:  Positive for dysphoric mood and sleep disturbance. Negative for behavioral problems, confusion, decreased concentration and  suicidal ideas. The patient is not nervous/anxious and is not hyperactive.     Objective:  BP (!) 158/100 (BP Location: Left Arm, Patient Position: Sitting, Cuff Size: Large)   Pulse 81   Temp 98.3 F (36.8 C) (Oral)   Resp 16   Ht 5\' 10"  (1.778 m)   Wt 272 lb (123.4 kg)   SpO2 95%   BMI 39.03 kg/m   BP Readings from Last 3 Encounters:  12/30/22 (!) 158/100  10/07/22 (!) 164/118  06/15/22 (!) 154/97    Wt Readings from Last 3 Encounters:  12/30/22 272 lb (123.4 kg)  04/03/22 266 lb (120.7 kg)  10/15/21 266 lb (120.7 kg)    Physical Exam Vitals reviewed.  Constitutional:      Appearance: He is obese. He is not ill-appearing.  HENT:     Nose: Nose normal.     Mouth/Throat:     Mouth: Mucous membranes are moist.  Eyes:     General: No scleral icterus.    Conjunctiva/sclera: Conjunctivae normal.  Cardiovascular:     Rate and Rhythm: Normal rate and regular rhythm.     Heart sounds: Normal heart sounds, S1 normal and S2 normal.     No gallop.     Comments: EKG- NSR, 85 bpm LAE, LVH Flat T waves in lateral leads No Q waves Pulmonary:     Effort: Pulmonary effort is normal.     Breath sounds: No stridor. No wheezing, rhonchi or rales.  Abdominal:     General: Abdomen  is flat.     Palpations: There is no mass.     Tenderness: There is no abdominal tenderness. There is no guarding.     Hernia: No hernia is present.  Musculoskeletal:     Cervical back: Neck supple.     Right lower leg: No edema.     Left lower leg: No edema.  Lymphadenopathy:     Cervical: No cervical adenopathy.  Skin:    General: Skin is warm and dry.     Findings: No rash.  Neurological:     General: No focal deficit present.     Mental Status: He is alert. Mental status is at baseline.  Psychiatric:        Attention and Perception: He is inattentive.        Mood and Affect: Mood is not anxious or depressed. Affect is angry. Affect is not flat or tearful.        Speech: Speech normal.         Behavior: Behavior normal. Behavior is not slowed, aggressive or withdrawn. Behavior is cooperative.        Thought Content: Thought content normal. Thought content is not paranoid or delusional. Thought content does not include homicidal or suicidal ideation.        Cognition and Memory: Cognition normal.        Judgment: Judgment normal.     Lab Results  Component Value Date   WBC 7.3 12/30/2022   HGB 14.6 12/30/2022   HCT 42.3 12/30/2022   PLT 251.0 12/30/2022   GLUCOSE 119 (H) 12/30/2022   CHOL 214 (H) 12/30/2022   TRIG 206.0 (H) 12/30/2022   HDL 37.40 (L) 12/30/2022   LDLDIRECT 159.0 12/30/2022   LDLCALC 134 (H) 03/11/2019   ALT 37 12/30/2022   AST 21 12/30/2022   NA 141 12/30/2022   K 3.0 (L) 12/30/2022   CL 104 12/30/2022   CREATININE 1.13 12/30/2022   BUN 15 12/30/2022   CO2 28 12/30/2022   TSH 0.77 12/30/2022   HGBA1C 5.6 12/30/2022    No results found.  Assessment & Plan:   Encounter for general adult medical examination with abnormal findings- Exam completed, labs reviewed, vaccines reviewed, no cancer screenings indicated, patient education was given. -     Lipid panel; Future  Essential hypertension- Will evaluate for secondary causes and endorgan damage.  Will discontinue HCTZ and control the blood pressure with Dyazide and an ARB. -     TSH; Future -     Urinalysis, Routine w reflex microscopic; Future -     Basic metabolic panel; Future -     CBC with Differential/Platelet; Future -     Aldosterone + renin activity w/ ratio; Future -     EKG 12-Lead -     Triamterene-HCTZ; Take 1 each (1 capsule total) by mouth daily.  Dispense: 90 capsule; Refill: 0 -     Potassium Chloride ER; Take 1 tablet (10 mEq total) by mouth 2 (two) times daily.  Dispense: 180 tablet; Refill: 0 -     Telmisartan; Take 1 tablet (40 mg total) by mouth daily.  Dispense: 90 tablet; Refill: 0  Hypertensive left ventricular hypertrophy, without heart failure- There are no signs  of fluid overload or ischemia.  Will try to get better control of his blood pressure. -     TSH; Future -     Hepatic function panel; Future -     Basic metabolic panel; Future -  Aldosterone + renin activity w/ ratio; Future -     Triamterene-HCTZ; Take 1 each (1 capsule total) by mouth daily.  Dispense: 90 capsule; Refill: 0 -     Potassium Chloride ER; Take 1 tablet (10 mEq total) by mouth 2 (two) times daily.  Dispense: 180 tablet; Refill: 0 -     Telmisartan; Take 1 tablet (40 mg total) by mouth daily.  Dispense: 90 tablet; Refill: 0  Marijuana abuse -     Urine drugs of abuse scrn w alc, routine (Ref Lab); Future  Class 2 severe obesity due to excess calories with serious comorbidity and body mass index (BMI) of 37.0 to 37.9 in adult [facility])- He agrees to improve his lifestyle modifications. -     Hepatic function panel; Future -     Hemoglobin A1c; Future -     Basic metabolic panel; Future  Drug-induced erectile dysfunction -     Testosterone Total,Free,Bio, Males; Future  Chest pain at rest -     Troponin I (High Sensitivity); Future  Abnormal electrocardiogram (ECG) (EKG) -     Troponin I (High Sensitivity); Future  Diuretic-induced hypokalemia -     Potassium Chloride ER; Take 1 tablet (10 mEq total) by mouth 2 (two) times daily.  Dispense: 180 tablet; Refill: 0  Other orders -     LDL cholesterol, direct -     Panel [id number]     Follow-up: Return in about 3 months (around 04/01/2023).  Sanda Linger, MD

## 2022-12-30 NOTE — Patient Instructions (Signed)
Hypertension, Adult High blood pressure (hypertension) is when the force of blood pumping through the arteries is too strong. The arteries are the blood vessels that carry blood from the heart throughout the body. Hypertension forces the heart to work harder to pump blood and may cause arteries to become narrow or stiff. Untreated or uncontrolled hypertension can lead to a heart attack, heart failure, a stroke, kidney disease, and other problems. A blood pressure reading consists of a higher number over a lower number. Ideally, your blood pressure should be below 120/80. The first ("top") number is called the systolic pressure. It is a measure of the pressure in your arteries as your heart beats. The second ("bottom") number is called the diastolic pressure. It is a measure of the pressure in your arteries as the heart relaxes. What are the causes? The exact cause of this condition is not known. There are some conditions that result in high blood pressure. What increases the risk? Certain factors may make you more likely to develop high blood pressure. Some of these risk factors are under your control, including: Smoking. Not getting enough exercise or physical activity. Being overweight. Having too much fat, sugar, calories, or salt (sodium) in your diet. Drinking too much alcohol. Other risk factors include: Having a personal history of heart disease, diabetes, high cholesterol, or kidney disease. Stress. Having a family history of high blood pressure and high cholesterol. Having obstructive sleep apnea. Age. The risk increases with age. What are the signs or symptoms? High blood pressure may not cause symptoms. Very high blood pressure (hypertensive crisis) may cause: Headache. Fast or irregular heartbeats (palpitations). Shortness of breath. Nosebleed. Nausea and vomiting. Vision changes. Severe chest pain, dizziness, and seizures. How is this diagnosed? This condition is diagnosed by  measuring your blood pressure while you are seated, with your arm resting on a flat surface, your legs uncrossed, and your feet flat on the floor. The cuff of the blood pressure monitor will be placed directly against the skin of your upper arm at the level of your heart. Blood pressure should be measured at least twice using the same arm. Certain conditions can cause a difference in blood pressure between your right and left arms. If you have a high blood pressure reading during one visit or you have normal blood pressure with other risk factors, you may be asked to: Return on a different day to have your blood pressure checked again. Monitor your blood pressure at home for 1 week or longer. If you are diagnosed with hypertension, you may have other blood or imaging tests to help your health care provider understand your overall risk for other conditions. How is this treated? This condition is treated by making healthy lifestyle changes, such as eating healthy foods, exercising more, and reducing your alcohol intake. You may be referred for counseling on a healthy diet and physical activity. Your health care provider may prescribe medicine if lifestyle changes are not enough to get your blood pressure under control and if: Your systolic blood pressure is above 130. Your diastolic blood pressure is above 80. Your personal target blood pressure may vary depending on your medical conditions, your age, and other factors. Follow these instructions at home: Eating and drinking  Eat a diet that is high in fiber and potassium, and low in sodium, added sugar, and fat. An example of this eating plan is called the DASH diet. DASH stands for Dietary Approaches to Stop Hypertension. To eat this way: Eat   plenty of fresh fruits and vegetables. Try to fill one half of your plate at each meal with fruits and vegetables. Eat whole grains, such as whole-wheat pasta, brown rice, or whole-grain bread. Fill about one  fourth of your plate with whole grains. Eat or drink low-fat dairy products, such as skim milk or low-fat yogurt. Avoid fatty cuts of meat, processed or cured meats, and poultry with skin. Fill about one fourth of your plate with lean proteins, such as fish, chicken without skin, beans, eggs, or tofu. Avoid pre-made and processed foods. These tend to be higher in sodium, added sugar, and fat. Reduce your daily sodium intake. Many people with hypertension should eat less than 1,500 mg of sodium a day. Do not drink alcohol if: Your health care provider tells you not to drink. You are pregnant, may be pregnant, or are planning to become pregnant. If you drink alcohol: Limit how much you have to: 0-1 drink a day for women. 0-2 drinks a day for men. Know how much alcohol is in your drink. In the [place] one drink equals one 12 oz bottle of beer (355 mL), one 5 oz glass of wine (148 mL), or one 1 oz glass of hard liquor (44 mL). Lifestyle  Work with your health care provider to maintain a healthy body weight or to lose weight. Ask what an ideal weight is for you. Get at least 30 minutes of exercise that causes your heart to beat faster (aerobic exercise) most days of the week. Activities may include walking, swimming, or biking. Include exercise to strengthen your muscles (resistance exercise), such as Pilates or lifting weights, as part of your weekly exercise routine. Try to do these types of exercises for 30 minutes at least 3 days a week. Do not use any products that contain nicotine or tobacco. These products include cigarettes, chewing tobacco, and vaping devices, such as e-cigarettes. If you need help quitting, ask your health care provider. Monitor your blood pressure at home as told by your health care provider. Keep all follow-up visits. This is important. Medicines Take over-the-counter and prescription medicines only as told by your health care provider. Follow directions carefully. Blood  pressure medicines must be taken as prescribed. Do not skip doses of blood pressure medicine. Doing this puts you at risk for problems and can make the medicine less effective. Ask your health care provider about side effects or reactions to medicines that you should watch for. Contact a health care provider if you: Think you are having a reaction to a medicine you are taking. Have headaches that keep coming back (recurring). Feel dizzy. Have swelling in your ankles. Have trouble with your vision. Get help right away if you: Develop a severe headache or confusion. Have unusual weakness or numbness. Feel faint. Have severe pain in your chest or abdomen. Vomit repeatedly. Have trouble breathing. These symptoms may be an emergency. Get help right away. Call 911. Do not wait to see if the symptoms will go away. Do not drive yourself to the hospital. Summary Hypertension is when the force of blood pumping through your arteries is too strong. If this condition is not controlled, it may put you at risk for serious complications. Your personal target blood pressure may vary depending on your medical conditions, your age, and other factors. For most people, a normal blood pressure is less than 120/80. Hypertension is treated with lifestyle changes, medicines, or a combination of both. Lifestyle changes include losing weight, eating a healthy,   low-sodium diet, exercising more, and limiting alcohol. This information is not intended to replace advice given to you by your health care provider. Make sure you discuss any questions you have with your health care provider. Document Revised: 05/22/2021 Document Reviewed: 05/22/2021 Elsevier Patient Education  2024 [employer] Inc.  

## 2022-12-31 ENCOUNTER — Telehealth: Payer: Self-pay | Admitting: Internal Medicine

## 2022-12-31 NOTE — Telephone Encounter (Signed)
Pt called wanting to speak with a nurse about what is Dr. Yetta Barre going to do about his Blood pressure pills and weight lost injection that they discuss in office.

## 2022-12-31 NOTE — Progress Notes (Signed)
His BP is too high the requested meds Will reconsider when BP is around 130/80

## 2022-12-31 NOTE — Telephone Encounter (Signed)
Etta Grandchild, MD 12/31/2022  2:14 PM EDT Back to Top    His BP is too high the requested meds Will reconsider when BP is around 130/80   Shaquel Josephson Mea Ozga, CMA 12/31/2022  1:39 PM EDT     Pt stated that he would like to come pick up a paper Rx for metformin, sildenafil, and the weight loss medication. Please advise.      Pt has been informed and expressed that he was not happy with that decision. Pt began yelling "how can he take me off of metformin?" "What else am I suppose to do?" I offered to have another discussion with PCP in regard. Pt them hostilely stated that "Ill just come to the office to talk to him myself". CMA suggested that it would not be a good idea to come to the office irritate and demanding to see the dr but I would have a member of management reach out to him to discuss and CMA expressed that she would no longer be yelled at by the patient. Pt then said "well bye" and disconnected the call.

## 2022-12-31 NOTE — Telephone Encounter (Signed)
Prescription Request  12/31/2022  LOV: 12/30/2022  What is the name of the medication or equipment? metFORMIN (GLUCOPHAGE) 500 MG tablet   Have you contacted your pharmacy to request a refill? No   Which pharmacy would you like this sent to?  [facility] [address] [place] - [address] [place] [address] Phone: [phone] Fax: [phone]    Patient notified that their request is being sent to the clinical staff for review and that they should receive a response within 2 business days.   Please advise at Mobile [phone] (mobile)

## 2022-12-31 NOTE — Telephone Encounter (Signed)
Patient called back to check on the status of his medications. Best callback is [phone].

## 2022-12-31 NOTE — Telephone Encounter (Signed)
Patient called back to check on the status of his refill. He was very upset because he said he already requested it yesterday. Best callback is [phone].

## 2023-01-03 LAB — URINE DRUGS OF ABUSE SCREEN W ALC, ROUTINE (REF LAB)
Amphetamines, Urine: NEGATIVE ng/mL
Barbiturate Quant, Ur: NEGATIVE ng/mL
Benzodiazepine Quant, Ur: NEGATIVE ng/mL
Cocaine (Metab.): NEGATIVE ng/mL
Ethanol, Urine: NEGATIVE %
Methadone Screen, Urine: NEGATIVE ng/mL
Opiate Quant, Ur: NEGATIVE ng/mL
PCP Quant, Ur: NEGATIVE ng/mL
Propoxyphene: NEGATIVE ng/mL

## 2023-01-03 LAB — PANEL 799049
CARBOXY THC GC/MS CONF: 537 ng/mL
Cannabinoid GC/MS, Ur: POSITIVE — AB

## 2023-01-09 ENCOUNTER — Other Ambulatory Visit (HOSPITAL_COMMUNITY): Payer: Self-pay

## 2023-01-10 LAB — TESTOSTERONE TOTAL,FREE,BIO, MALES
Albumin: 4.2 g/dL (ref 3.6–5.1)
Sex Hormone Binding: 34 nmol/L (ref 10–50)
Testosterone, Bioavailable: 143.5 ng/dL (ref 110.0–575.0)
Testosterone, Free: 74.5 pg/mL (ref 46.0–224.0)
Testosterone: 546 ng/dL (ref 250–827)

## 2023-01-10 LAB — ALDOSTERONE + RENIN ACTIVITY W/ RATIO
ALDO / PRA Ratio: 3 Ratio (ref 0.9–28.9)
Aldosterone: 6 ng/dL
Renin Activity: 2.02 ng/mL/h (ref 0.25–5.82)

## 2023-02-13 ENCOUNTER — Ambulatory Visit (INDEPENDENT_AMBULATORY_CARE_PROVIDER_SITE_OTHER): Payer: Commercial Managed Care - PPO | Admitting: Internal Medicine

## 2023-02-13 ENCOUNTER — Encounter: Payer: Self-pay | Admitting: Internal Medicine

## 2023-02-13 VITALS — BP 138/86 | HR 60 | Temp 98.6°F | Resp 16 | Ht 70.0 in | Wt 276.0 lb

## 2023-02-13 DIAGNOSIS — E876 Hypokalemia: Secondary | ICD-10-CM

## 2023-02-13 DIAGNOSIS — N522 Drug-induced erectile dysfunction: Secondary | ICD-10-CM

## 2023-02-13 DIAGNOSIS — I10 Essential (primary) hypertension: Secondary | ICD-10-CM

## 2023-02-13 DIAGNOSIS — Z6839 Body mass index (BMI) 39.0-39.9, adult: Secondary | ICD-10-CM

## 2023-02-13 DIAGNOSIS — I119 Hypertensive heart disease without heart failure: Secondary | ICD-10-CM

## 2023-02-13 DIAGNOSIS — T502X5A Adverse effect of carbonic-anhydrase inhibitors, benzothiadiazides and other diuretics, initial encounter: Secondary | ICD-10-CM

## 2023-02-13 LAB — BASIC METABOLIC PANEL
BUN: 18 mg/dL (ref 6–23)
CO2: 27 mEq/L (ref 19–32)
Calcium: 9.4 mg/dL (ref 8.4–10.5)
Chloride: 102 mEq/L (ref 96–112)
Creatinine, Ser: 1.3 mg/dL (ref 0.40–1.50)
GFR: 69.47 mL/min (ref >60.00)
Glucose, Bld: 159 mg/dL — ABNORMAL HIGH (ref 70–99)
Potassium: 3.2 mEq/L — ABNORMAL LOW (ref 3.5–5.1)
Sodium: 138 mEq/L (ref 135–145)

## 2023-02-13 LAB — MAGNESIUM: Magnesium: 1.8 mg/dL (ref 1.5–2.5)

## 2023-02-13 MED ORDER — SEMAGLUTIDE-WEIGHT MANAGEMENT 0.5 MG/0.5ML ~~LOC~~ SOAJ: 0.5000 mg | SUBCUTANEOUS | 0 refills | Status: DC

## 2023-02-13 MED ORDER — SEMAGLUTIDE-WEIGHT MANAGEMENT 1.7 MG/0.75ML ~~LOC~~ SOAJ: 1.7000 mg | SUBCUTANEOUS | 0 refills | Status: DC

## 2023-02-13 MED ORDER — METFORMIN HCL ER 500 MG PO TB24: 500.0000 mg | ORAL_TABLET | Freq: Every day | ORAL | 1 refills | Status: DC

## 2023-02-13 MED ORDER — SEMAGLUTIDE-WEIGHT MANAGEMENT 0.25 MG/0.5ML ~~LOC~~ SOAJ: 0.2500 mg | SUBCUTANEOUS | 0 refills | Status: DC

## 2023-02-13 MED ORDER — SEMAGLUTIDE-WEIGHT MANAGEMENT 1 MG/0.5ML ~~LOC~~ SOAJ: 1.0000 mg | SUBCUTANEOUS | 0 refills | Status: DC

## 2023-02-13 MED ORDER — INSULIN PEN NEEDLE 32G X 6 MM MISC: 1.0000 | 1 refills | Status: DC

## 2023-02-13 MED ORDER — SEMAGLUTIDE-WEIGHT MANAGEMENT 2.4 MG/0.75ML ~~LOC~~ SOAJ: 2.4000 mg | SUBCUTANEOUS | 1 refills | Status: DC

## 2023-02-13 NOTE — Patient Instructions (Signed)
Hypertension, Adult High blood pressure (hypertension) is when the force of blood pumping through the arteries is too strong. The arteries are the blood vessels that carry blood from the heart throughout the body. Hypertension forces the heart to work harder to pump blood and may cause arteries to become narrow or stiff. Untreated or uncontrolled hypertension can lead to a heart attack, heart failure, a stroke, kidney disease, and other problems. A blood pressure reading consists of a higher number over a lower number. Ideally, your blood pressure should be below 120/80. The first ("top") number is called the systolic pressure. It is a measure of the pressure in your arteries as your heart beats. The second ("bottom") number is called the diastolic pressure. It is a measure of the pressure in your arteries as the heart relaxes. What are the causes? The exact cause of this condition is not known. There are some conditions that result in high blood pressure. What increases the risk? Certain factors may make you more likely to develop high blood pressure. Some of these risk factors are under your control, including: Smoking. Not getting enough exercise or physical activity. Being overweight. Having too much fat, sugar, calories, or salt (sodium) in your diet. Drinking too much alcohol. Other risk factors include: Having a personal history of heart disease, diabetes, high cholesterol, or kidney disease. Stress. Having a family history of high blood pressure and high cholesterol. Having obstructive sleep apnea. Age. The risk increases with age. What are the signs or symptoms? High blood pressure may not cause symptoms. Very high blood pressure (hypertensive crisis) may cause: Headache. Fast or irregular heartbeats (palpitations). Shortness of breath. Nosebleed. Nausea and vomiting. Vision changes. Severe chest pain, dizziness, and seizures. How is this diagnosed? This condition is diagnosed by  measuring your blood pressure while you are seated, with your arm resting on a flat surface, your legs uncrossed, and your feet flat on the floor. The cuff of the blood pressure monitor will be placed directly against the skin of your upper arm at the level of your heart. Blood pressure should be measured at least twice using the same arm. Certain conditions can cause a difference in blood pressure between your right and left arms. If you have a high blood pressure reading during one visit or you have normal blood pressure with other risk factors, you may be asked to: Return on a different day to have your blood pressure checked again. Monitor your blood pressure at home for 1 week or longer. If you are diagnosed with hypertension, you may have other blood or imaging tests to help your health care provider understand your overall risk for other conditions. How is this treated? This condition is treated by making healthy lifestyle changes, such as eating healthy foods, exercising more, and reducing your alcohol intake. You may be referred for counseling on a healthy diet and physical activity. Your health care provider may prescribe medicine if lifestyle changes are not enough to get your blood pressure under control and if: Your systolic blood pressure is above 130. Your diastolic blood pressure is above 80. Your personal target blood pressure may vary depending on your medical conditions, your age, and other factors. Follow these instructions at home: Eating and drinking  Eat a diet that is high in fiber and potassium, and low in sodium, added sugar, and fat. An example of this eating plan is called the DASH diet. DASH stands for Dietary Approaches to Stop Hypertension. To eat this way: Eat   plenty of fresh fruits and vegetables. Try to fill one half of your plate at each meal with fruits and vegetables. Eat whole grains, such as whole-wheat pasta, brown rice, or whole-grain bread. Fill about one  fourth of your plate with whole grains. Eat or drink low-fat dairy products, such as skim milk or low-fat yogurt. Avoid fatty cuts of meat, processed or cured meats, and poultry with skin. Fill about one fourth of your plate with lean proteins, such as fish, chicken without skin, beans, eggs, or tofu. Avoid pre-made and processed foods. These tend to be higher in sodium, added sugar, and fat. Reduce your daily sodium intake. Many people with hypertension should eat less than 1,500 mg of sodium a day. Do not drink alcohol if: Your health care provider tells you not to drink. You are pregnant, may be pregnant, or are planning to become pregnant. If you drink alcohol: Limit how much you have to: 0-1 drink a day for women. 0-2 drinks a day for men. Know how much alcohol is in your drink. In the [place] one drink equals one 12 oz bottle of beer (355 mL), one 5 oz glass of wine (148 mL), or one 1 oz glass of hard liquor (44 mL). Lifestyle  Work with your health care provider to maintain a healthy body weight or to lose weight. Ask what an ideal weight is for you. Get at least 30 minutes of exercise that causes your heart to beat faster (aerobic exercise) most days of the week. Activities may include walking, swimming, or biking. Include exercise to strengthen your muscles (resistance exercise), such as Pilates or lifting weights, as part of your weekly exercise routine. Try to do these types of exercises for 30 minutes at least 3 days a week. Do not use any products that contain nicotine or tobacco. These products include cigarettes, chewing tobacco, and vaping devices, such as e-cigarettes. If you need help quitting, ask your health care provider. Monitor your blood pressure at home as told by your health care provider. Keep all follow-up visits. This is important. Medicines Take over-the-counter and prescription medicines only as told by your health care provider. Follow directions carefully. Blood  pressure medicines must be taken as prescribed. Do not skip doses of blood pressure medicine. Doing this puts you at risk for problems and can make the medicine less effective. Ask your health care provider about side effects or reactions to medicines that you should watch for. Contact a health care provider if you: Think you are having a reaction to a medicine you are taking. Have headaches that keep coming back (recurring). Feel dizzy. Have swelling in your ankles. Have trouble with your vision. Get help right away if you: Develop a severe headache or confusion. Have unusual weakness or numbness. Feel faint. Have severe pain in your chest or abdomen. Vomit repeatedly. Have trouble breathing. These symptoms may be an emergency. Get help right away. Call 911. Do not wait to see if the symptoms will go away. Do not drive yourself to the hospital. Summary Hypertension is when the force of blood pumping through your arteries is too strong. If this condition is not controlled, it may put you at risk for serious complications. Your personal target blood pressure may vary depending on your medical conditions, your age, and other factors. For most people, a normal blood pressure is less than 120/80. Hypertension is treated with lifestyle changes, medicines, or a combination of both. Lifestyle changes include losing weight, eating a healthy,   low-sodium diet, exercising more, and limiting alcohol. This information is not intended to replace advice given to you by your health care provider. Make sure you discuss any questions you have with your health care provider. Document Revised: 05/22/2021 Document Reviewed: 05/22/2021 Elsevier Patient Education  2024 [employer] Inc.  

## 2023-02-13 NOTE — Progress Notes (Unsigned)
Subjective:  Patient ID: Cory Little, male    DOB: December 31, 1985  Age: 37 y.o. MRN: [id number]  CC: Hypertension   HPI Cory Little presents for f/up ----  Discussed the use of AI scribe software for clinical note transcription with the patient, who gave verbal consent to proceed.  History of Present Illness   The patient, with a history of hypertension, presents with a positive response to a new antihypertensive medication, noting improved mobility and increased interaction. They also report a desire to resume metformin, which was discontinued at the last visit, due to perceived weight gain since discontinuation. They deny any adverse gastrointestinal effects from metformin, such as diarrhea or cramping, despite a high-protein diet.  The patient also reports frequent urination, which they attribute to the diuretic component of their new blood pressure medication. They deny any edema in the legs or hands, noting only minimal swelling that is managed with the diuretic.  The patient expresses a desire to lose weight and has a preference for metformin over Wegovy due to cost and availability. They also express interest in resuming Adderall at a low dose for focus at work, pending blood pressure control.  The patient's potassium level was previously low, prompting a change in their blood pressure medication. They are scheduled for a potassium level check at this visit. They also request a prescription for Cialis.  The patient's goal is to achieve a blood pressure reading of 130/80 to allow for the potential resumption of Adderall.   He is not taking the K+ supplement.      Outpatient Medications Prior to Visit  Medication Sig Dispense Refill   busPIRone (BUSPAR) 10 MG tablet Take 1 tablet (10 mg total) by mouth 3 (three) times daily. 270 tablet 0   potassium chloride (KLOR-CON 10) 10 MEQ tablet Take 1 tablet (10 mEq total) by mouth 2 (two) times daily. 180 tablet 0   telmisartan  (MICARDIS) 40 MG tablet Take 1 tablet (40 mg total) by mouth daily. 90 tablet 0   triamterene-hydrochlorothiazide (DYAZIDE) 37.5-25 MG capsule Take 1 each (1 capsule total) by mouth daily. 90 capsule 0   Insulin Pen Needle 32G X 6 MM MISC 1 Act by Does not apply route once a week. 50 each 0   metFORMIN (GLUCOPHAGE) 500 MG tablet Take 1 tablet (500 mg total) by mouth 2 (two) times daily with a meal. 180 tablet 0   No facility-administered medications prior to visit.    ROS Review of Systems  Constitutional:  Positive for unexpected weight change (wt gain). Negative for chills, diaphoresis and fatigue.  HENT: Negative.    Eyes: Negative.   Respiratory: Negative.  Negative for cough, shortness of breath and stridor.   Cardiovascular:  Negative for chest pain, palpitations and leg swelling.  Gastrointestinal:  Negative for abdominal pain, constipation, diarrhea, nausea and vomiting.  Endocrine: Negative.   Genitourinary: Negative.  Negative for difficulty urinating.  Musculoskeletal: Negative.  Negative for arthralgias and myalgias.  Skin: Negative.  Negative for color change and pallor.  Neurological: Negative.  Negative for dizziness.  Hematological:  Negative for adenopathy. Does not bruise/bleed easily.  Psychiatric/Behavioral: Negative.      Objective:  BP 138/86 (BP Location: Left Arm, Patient Position: Sitting, Cuff Size: Large)   Pulse 60   Temp 98.6 F (37 C) (Oral)   Resp 16   Ht 5\' 10"  (1.778 m)   Wt 276 lb (125.2 kg)   SpO2 95%   BMI 39.60 kg/m  BP Readings from Last 3 Encounters:  02/13/23 138/86  12/30/22 (!) 158/100  10/07/22 (!) 164/118    Wt Readings from Last 3 Encounters:  02/13/23 276 lb (125.2 kg)  12/30/22 272 lb (123.4 kg)  04/03/22 266 lb (120.7 kg)    Physical Exam Vitals reviewed.  Constitutional:      Appearance: Normal appearance. He is obese.  HENT:     Nose: Nose normal.     Mouth/Throat:     Mouth: Mucous membranes are moist.  Eyes:      General: No scleral icterus.    Conjunctiva/sclera: Conjunctivae normal.  Cardiovascular:     Rate and Rhythm: Normal rate and regular rhythm.     Heart sounds: No murmur heard. Pulmonary:     Effort: Pulmonary effort is normal.     Breath sounds: No stridor. No wheezing, rhonchi or rales.  Abdominal:     General: Abdomen is flat.     Palpations: There is no mass.     Tenderness: There is no abdominal tenderness. There is no guarding.     Hernia: No hernia is present.  Musculoskeletal:        General: Normal range of motion.     Cervical back: Neck supple.     Right lower leg: No edema.     Left lower leg: No edema.  Lymphadenopathy:     Cervical: No cervical adenopathy.  Skin:    General: Skin is warm and dry.  Neurological:     General: No focal deficit present.     Mental Status: Mental status is at baseline.  Psychiatric:        Mood and Affect: Mood normal.        Behavior: Behavior normal.     Lab Results  Component Value Date   WBC 7.3 12/30/2022   HGB 14.6 12/30/2022   HCT 42.3 12/30/2022   PLT 251.0 12/30/2022   GLUCOSE 159 (H) 02/13/2023   CHOL 214 (H) 12/30/2022   TRIG 206.0 (H) 12/30/2022   HDL 37.40 (L) 12/30/2022   LDLDIRECT 159.0 12/30/2022   LDLCALC 134 (H) 03/11/2019   ALT 37 12/30/2022   AST 21 12/30/2022   NA 138 02/13/2023   K 3.2 (L) 02/13/2023   CL 102 02/13/2023   CREATININE 1.30 02/13/2023   BUN 18 02/13/2023   CO2 27 02/13/2023   TSH 0.77 12/30/2022   HGBA1C 5.6 12/30/2022    No results found.  Assessment & Plan:   Diuretic-induced hypokalemia- I have asked him to take the K+ supplement. -     Basic metabolic panel; Future -     Magnesium; Future  Essential hypertension - BP has improved but not at goal. He was encouraged to improve his lifestyle modifications.       -     Basic metabolic panel; Future -     Magnesium; Future  Hypertensive left ventricular hypertrophy, without heart failure -     Basic metabolic panel;  Future -     Magnesium; Future  Morbid obesity (HCC) -     Semaglutide-Weight Management; Inject 0.25 mg into the skin once a week for 28 days.  Dispense: 2 mL; Refill: 0 -     Semaglutide-Weight Management; Inject 0.5 mg into the skin once a week for 28 days.  Dispense: 2 mL; Refill: 0 -     Semaglutide-Weight Management; Inject 1 mg into the skin once a week for 28 days.  Dispense: 2 mL; Refill: 0 -  Semaglutide-Weight Management; Inject 1.7 mg into the skin once a week for 28 days.  Dispense: 3 mL; Refill: 0 -     Semaglutide-Weight Management; Inject 2.4 mg into the skin once a week.  Dispense: 3 mL; Refill: 1 -     metFORMIN HCl ER; Take 1 tablet (500 mg total) by mouth daily with breakfast.  Dispense: 90 tablet; Refill: 1 -     Insulin Pen Needle; 1 Act by Does not apply route once a week.  Dispense: 50 each; Refill: 1  Drug-induced erectile dysfunction -     Tadalafil; Take 1 tablet (20 mg total) by mouth every other day as needed for erectile dysfunction.  Dispense: 10 tablet; Refill: 3     Follow-up: Return in about 6 months (around 08/16/2023).  Sanda Linger, MD

## 2023-02-14 ENCOUNTER — Encounter: Payer: Self-pay | Admitting: Internal Medicine

## 2023-02-14 ENCOUNTER — Telehealth: Payer: Self-pay

## 2023-02-14 MED ORDER — TADALAFIL 20 MG PO TABS: 20.0000 mg | ORAL_TABLET | ORAL | 3 refills | Status: DC | PRN

## 2023-02-14 NOTE — Telephone Encounter (Signed)
*  Primary  PA request received for Wegovy 0.25MG /0.5ML auto-injectors  PA submitted to [facility] via [facility] and is pending additional questions/determination  Key: [id number]

## 2023-02-24 ENCOUNTER — Other Ambulatory Visit (HOSPITAL_COMMUNITY): Payer: Self-pay

## 2023-02-24 NOTE — Telephone Encounter (Signed)
Pharmacy Patient Advocate Encounter  Received notification from  [facility]  that Prior Authorization for Wegovy 0.25mg /0.46ml has been APPROVED from 02/14/23 to 02/14/24. Ran test claim, Copay is $refill too soon.  PA #/Case ID/Reference #: [id number]  Approval letter indexed to chart

## 2023-03-03 ENCOUNTER — Encounter: Payer: Self-pay | Admitting: Internal Medicine

## 2023-03-03 ENCOUNTER — Ambulatory Visit (INDEPENDENT_AMBULATORY_CARE_PROVIDER_SITE_OTHER): Payer: Commercial Managed Care - PPO | Admitting: Internal Medicine

## 2023-03-03 VITALS — BP 138/84 | HR 90 | Temp 98.3°F | Resp 16 | Ht 70.0 in | Wt 276.0 lb

## 2023-03-03 DIAGNOSIS — I119 Hypertensive heart disease without heart failure: Secondary | ICD-10-CM | POA: Diagnosis not present

## 2023-03-03 DIAGNOSIS — E876 Hypokalemia: Secondary | ICD-10-CM | POA: Diagnosis not present

## 2023-03-03 DIAGNOSIS — I10 Essential (primary) hypertension: Secondary | ICD-10-CM | POA: Diagnosis not present

## 2023-03-03 DIAGNOSIS — T502X5A Adverse effect of carbonic-anhydrase inhibitors, benzothiadiazides and other diuretics, initial encounter: Secondary | ICD-10-CM

## 2023-03-03 DIAGNOSIS — F900 Attention-deficit hyperactivity disorder, predominantly inattentive type: Secondary | ICD-10-CM

## 2023-03-03 MED ORDER — TELMISARTAN 40 MG PO TABS
40.0000 mg | ORAL_TABLET | Freq: Every day | ORAL | 0 refills | Status: DC
Start: 2023-03-03 — End: 2023-10-13

## 2023-03-03 MED ORDER — PHENTERMINE HCL 37.5 MG PO CAPS: 37.5000 mg | ORAL_CAPSULE | ORAL | 0 refills | Status: DC

## 2023-03-03 MED ORDER — AMPHETAMINE-DEXTROAMPHETAMINE 10 MG PO TABS
10.0000 mg | ORAL_TABLET | Freq: Every day | ORAL | 0 refills | Status: DC
Start: 2023-03-03 — End: 2023-11-06

## 2023-03-03 NOTE — Patient Instructions (Signed)
Hypertension, Adult High blood pressure (hypertension) is when the force of blood pumping through the arteries is too strong. The arteries are the blood vessels that carry blood from the heart throughout the body. Hypertension forces the heart to work harder to pump blood and may cause arteries to become narrow or stiff. Untreated or uncontrolled hypertension can lead to a heart attack, heart failure, a stroke, kidney disease, and other problems. A blood pressure reading consists of a higher number over a lower number. Ideally, your blood pressure should be below 120/80. The first ("top") number is called the systolic pressure. It is a measure of the pressure in your arteries as your heart beats. The second ("bottom") number is called the diastolic pressure. It is a measure of the pressure in your arteries as the heart relaxes. What are the causes? The exact cause of this condition is not known. There are some conditions that result in high blood pressure. What increases the risk? Certain factors may make you more likely to develop high blood pressure. Some of these risk factors are under your control, including: Smoking. Not getting enough exercise or physical activity. Being overweight. Having too much fat, sugar, calories, or salt (sodium) in your diet. Drinking too much alcohol. Other risk factors include: Having a personal history of heart disease, diabetes, high cholesterol, or kidney disease. Stress. Having a family history of high blood pressure and high cholesterol. Having obstructive sleep apnea. Age. The risk increases with age. What are the signs or symptoms? High blood pressure may not cause symptoms. Very high blood pressure (hypertensive crisis) may cause: Headache. Fast or irregular heartbeats (palpitations). Shortness of breath. Nosebleed. Nausea and vomiting. Vision changes. Severe chest pain, dizziness, and seizures. How is this diagnosed? This condition is diagnosed by  measuring your blood pressure while you are seated, with your arm resting on a flat surface, your legs uncrossed, and your feet flat on the floor. The cuff of the blood pressure monitor will be placed directly against the skin of your upper arm at the level of your heart. Blood pressure should be measured at least twice using the same arm. Certain conditions can cause a difference in blood pressure between your right and left arms. If you have a high blood pressure reading during one visit or you have normal blood pressure with other risk factors, you may be asked to: Return on a different day to have your blood pressure checked again. Monitor your blood pressure at home for 1 week or longer. If you are diagnosed with hypertension, you may have other blood or imaging tests to help your health care provider understand your overall risk for other conditions. How is this treated? This condition is treated by making healthy lifestyle changes, such as eating healthy foods, exercising more, and reducing your alcohol intake. You may be referred for counseling on a healthy diet and physical activity. Your health care provider may prescribe medicine if lifestyle changes are not enough to get your blood pressure under control and if: Your systolic blood pressure is above 130. Your diastolic blood pressure is above 80. Your personal target blood pressure may vary depending on your medical conditions, your age, and other factors. Follow these instructions at home: Eating and drinking  Eat a diet that is high in fiber and potassium, and low in sodium, added sugar, and fat. An example of this eating plan is called the DASH diet. DASH stands for Dietary Approaches to Stop Hypertension. To eat this way: Eat   plenty of fresh fruits and vegetables. Try to fill one half of your plate at each meal with fruits and vegetables. Eat whole grains, such as whole-wheat pasta, brown rice, or whole-grain bread. Fill about one  fourth of your plate with whole grains. Eat or drink low-fat dairy products, such as skim milk or low-fat yogurt. Avoid fatty cuts of meat, processed or cured meats, and poultry with skin. Fill about one fourth of your plate with lean proteins, such as fish, chicken without skin, beans, eggs, or tofu. Avoid pre-made and processed foods. These tend to be higher in sodium, added sugar, and fat. Reduce your daily sodium intake. Many people with hypertension should eat less than 1,500 mg of sodium a day. Do not drink alcohol if: Your health care provider tells you not to drink. You are pregnant, may be pregnant, or are planning to become pregnant. If you drink alcohol: Limit how much you have to: 0-1 drink a day for women. 0-2 drinks a day for men. Know how much alcohol is in your drink. In the [place] one drink equals one 12 oz bottle of beer (355 mL), one 5 oz glass of wine (148 mL), or one 1 oz glass of hard liquor (44 mL). Lifestyle  Work with your health care provider to maintain a healthy body weight or to lose weight. Ask what an ideal weight is for you. Get at least 30 minutes of exercise that causes your heart to beat faster (aerobic exercise) most days of the week. Activities may include walking, swimming, or biking. Include exercise to strengthen your muscles (resistance exercise), such as Pilates or lifting weights, as part of your weekly exercise routine. Try to do these types of exercises for 30 minutes at least 3 days a week. Do not use any products that contain nicotine or tobacco. These products include cigarettes, chewing tobacco, and vaping devices, such as e-cigarettes. If you need help quitting, ask your health care provider. Monitor your blood pressure at home as told by your health care provider. Keep all follow-up visits. This is important. Medicines Take over-the-counter and prescription medicines only as told by your health care provider. Follow directions carefully. Blood  pressure medicines must be taken as prescribed. Do not skip doses of blood pressure medicine. Doing this puts you at risk for problems and can make the medicine less effective. Ask your health care provider about side effects or reactions to medicines that you should watch for. Contact a health care provider if you: Think you are having a reaction to a medicine you are taking. Have headaches that keep coming back (recurring). Feel dizzy. Have swelling in your ankles. Have trouble with your vision. Get help right away if you: Develop a severe headache or confusion. Have unusual weakness or numbness. Feel faint. Have severe pain in your chest or abdomen. Vomit repeatedly. Have trouble breathing. These symptoms may be an emergency. Get help right away. Call 911. Do not wait to see if the symptoms will go away. Do not drive yourself to the hospital. Summary Hypertension is when the force of blood pumping through your arteries is too strong. If this condition is not controlled, it may put you at risk for serious complications. Your personal target blood pressure may vary depending on your medical conditions, your age, and other factors. For most people, a normal blood pressure is less than 120/80. Hypertension is treated with lifestyle changes, medicines, or a combination of both. Lifestyle changes include losing weight, eating a healthy,   low-sodium diet, exercising more, and limiting alcohol. This information is not intended to replace advice given to you by your health care provider. Make sure you discuss any questions you have with your health care provider. Document Revised: 05/22/2021 Document Reviewed: 05/22/2021 Elsevier Patient Education  2024 [employer] Inc.  

## 2023-03-03 NOTE — Progress Notes (Unsigned)
Subjective:  Patient ID: Cory Little, male    DOB: [birth year]  Age: 37 y.o. MRN: [id number]  CC: Hypertension   HPI Cory Little presents for f/up----  Discussed the use of AI scribe software for clinical note transcription with the patient, who gave verbal consent to proceed.  History of Present Illness   The patient reports an overall improvement in their health status, with increased energy levels and no complaints of headache, blurred vision, chest pain, or shortness of breath. They were interested in starting semaglutide [facility]) for weight management, but their insurance did not cover this medication. They expressed a desire to initiate Adderall for ADHD, which they believe may also aid in weight loss. Additionally, they were open to trying phentermine for a month.  The patient also mentioned a recent visit to an urgent care center due to an infected cut (right index finger). The cut was drained and treated with antibiotics, and the patient believes it is healing well. No tetanus shot was administered during this visit.       Outpatient Medications Prior to Visit  Medication Sig Dispense Refill   busPIRone (BUSPAR) 10 MG tablet Take 1 tablet (10 mg total) by mouth 3 (three) times daily. 270 tablet 0   Insulin Pen Needle 32G X 6 MM MISC 1 Act by Does not apply route once a week. 50 each 1   metFORMIN (GLUCOPHAGE-XR) 500 MG 24 hr tablet Take 1 tablet (500 mg total) by mouth daily with breakfast. 90 tablet 1   potassium chloride (KLOR-CON 10) 10 MEQ tablet Take 1 tablet (10 mEq total) by mouth 2 (two) times daily. 180 tablet 0   tadalafil (CIALIS) 20 MG tablet Take 1 tablet (20 mg total) by mouth every other day as needed for erectile dysfunction. 10 tablet 3   triamterene-hydrochlorothiazide (DYAZIDE) 37.5-25 MG capsule Take 1 each (1 capsule total) by mouth daily. 90 capsule 0   Semaglutide-Weight Management 0.25 MG/0.5ML SOAJ Inject 0.25 mg into the skin once a week for 28  days. 2 mL 0   [START ON 03/14/2023] Semaglutide-Weight Management 0.5 MG/0.5ML SOAJ Inject 0.5 mg into the skin once a week for 28 days. 2 mL 0   [START ON 04/12/2023] Semaglutide-Weight Management 1 MG/0.5ML SOAJ Inject 1 mg into the skin once a week for 28 days. 2 mL 0   [START ON 05/11/2023] Semaglutide-Weight Management 1.7 MG/0.75ML SOAJ Inject 1.7 mg into the skin once a week for 28 days. 3 mL 0   [START ON 06/09/2023] Semaglutide-Weight Management 2.4 MG/0.75ML SOAJ Inject 2.4 mg into the skin once a week. 3 mL 1   telmisartan (MICARDIS) 40 MG tablet Take 1 tablet (40 mg total) by mouth daily. 90 tablet 0   No facility-administered medications prior to visit.    ROS Review of Systems  Constitutional:  Positive for unexpected weight change (wt gain). Negative for appetite change, chills, diaphoresis and fatigue.  HENT: Negative.    Respiratory: Negative.  Negative for cough, chest tightness and wheezing.   Cardiovascular:  Negative for chest pain, palpitations and leg swelling.  Gastrointestinal:  Negative for constipation, diarrhea, nausea and vomiting.  Genitourinary: Negative.   Musculoskeletal: Negative.  Negative for arthralgias and myalgias.  Skin: Negative.   Neurological: Negative.  Negative for dizziness, weakness and light-headedness.  Hematological:  Negative for adenopathy. Does not bruise/bleed easily.  Psychiatric/Behavioral:  Positive for decreased concentration. Negative for confusion and self-injury. The patient is not nervous/anxious.     Objective:  BP 138/84 (BP Location: Left Arm, Patient Position: Sitting, Cuff Size: Large)   Pulse 90   Temp 98.3 F (36.8 C) (Oral)   Resp 16   Ht 5\' 10"  (1.778 m)   Wt 276 lb (125.2 kg)   SpO2 97%   BMI 39.60 kg/m   BP Readings from Last 3 Encounters:  03/03/23 138/84  02/13/23 138/86  12/30/22 (!) 158/100    Wt Readings from Last 3 Encounters:  03/03/23 276 lb (125.2 kg)  02/13/23 276 lb (125.2 kg)  12/30/22  272 lb (123.4 kg)    Physical Exam Vitals reviewed.  Constitutional:      Appearance: He is obese.  HENT:     Mouth/Throat:     Mouth: Mucous membranes are moist.  Eyes:     General: No scleral icterus.    Conjunctiva/sclera: Conjunctivae normal.  Cardiovascular:     Rate and Rhythm: Normal rate and regular rhythm.     Heart sounds: No murmur heard. Pulmonary:     Effort: Pulmonary effort is normal.     Breath sounds: No stridor. No wheezing, rhonchi or rales.  Abdominal:     General: Abdomen is flat.     Palpations: There is no mass.     Tenderness: There is no abdominal tenderness. There is no guarding.     Hernia: No hernia is present.  Musculoskeletal:     Cervical back: Neck supple.  Lymphadenopathy:     Cervical: No cervical adenopathy.  Skin:    General: Skin is warm.     Findings: No lesion or rash.  Neurological:     General: No focal deficit present.     Mental Status: He is alert. Mental status is at baseline.  Psychiatric:        Mood and Affect: Mood normal.        Behavior: Behavior normal.     Lab Results  Component Value Date   WBC 7.3 12/30/2022   HGB 14.6 12/30/2022   HCT 42.3 12/30/2022   PLT 251.0 12/30/2022   GLUCOSE 89 03/03/2023   CHOL 214 (H) 12/30/2022   TRIG 206.0 (H) 12/30/2022   HDL 37.40 (L) 12/30/2022   LDLDIRECT 159.0 12/30/2022   LDLCALC 134 (H) 03/11/2019   ALT 37 12/30/2022   AST 21 12/30/2022   NA 140 03/03/2023   K 3.7 03/03/2023   CL 104 03/03/2023   CREATININE 1.30 03/03/2023   BUN 14 03/03/2023   CO2 24 03/03/2023   TSH 0.77 12/30/2022   HGBA1C 5.6 12/30/2022    No results found.  Assessment & Plan:   Diuretic-induced hypokalemia - His K+ is normal now. -     Basic metabolic panel; Future  Essential hypertension- He has not achieve his BP goal. He will continue working on his lifestyle modifications. -     Basic metabolic panel; Future -     Telmisartan; Take 1 tablet (40 mg total) by mouth daily.   Dispense: 90 tablet; Refill: 0  Hypertensive left ventricular hypertrophy, without heart failure -     Telmisartan; Take 1 tablet (40 mg total) by mouth daily.  Dispense: 90 tablet; Refill: 0  Attention deficit hyperactivity disorder (ADHD), predominantly inattentive type -     Amphetamine-Dextroamphetamine; Take 1 tablet (10 mg total) by mouth daily with breakfast.  Dispense: 30 tablet; Refill: 0  Morbid obesity (HCC) -     Phentermine HCl; Take 1 capsule (37.5 mg total) by mouth every morning.  Dispense: 30 capsule;  Refill: 0     Follow-up: Return in about 3 months (around 06/03/2023).  Sanda Linger, MD

## 2023-06-01 ENCOUNTER — Other Ambulatory Visit: Payer: Self-pay | Admitting: Internal Medicine

## 2023-06-01 DIAGNOSIS — I10 Essential (primary) hypertension: Secondary | ICD-10-CM

## 2023-06-01 DIAGNOSIS — I119 Hypertensive heart disease without heart failure: Secondary | ICD-10-CM

## 2023-07-11 ENCOUNTER — Ambulatory Visit (HOSPITAL_COMMUNITY)
Admission: EM | Admit: 2023-07-11 | Discharge: 2023-07-11 | Disposition: A | Discharge status: Home / Self Care | Payer: Self-pay | Attending: Emergency Medicine | Admitting: Emergency Medicine

## 2023-07-11 ENCOUNTER — Encounter (HOSPITAL_COMMUNITY): Payer: Self-pay

## 2023-07-11 DIAGNOSIS — Z760 Encounter for issue of repeat prescription: Secondary | ICD-10-CM

## 2023-07-11 DIAGNOSIS — I10 Essential (primary) hypertension: Secondary | ICD-10-CM

## 2023-07-11 DIAGNOSIS — E1165 Type 2 diabetes mellitus with hyperglycemia: Secondary | ICD-10-CM

## 2023-07-11 MED ORDER — TELMISARTAN 40 MG PO TABS: 40.0000 mg | ORAL_TABLET | Freq: Every day | ORAL | 0 refills | Status: DC

## 2023-07-11 MED ORDER — METFORMIN HCL 500 MG PO TABS: 500.0000 mg | ORAL_TABLET | Freq: Two times a day (BID) | ORAL | 0 refills | Status: DC

## 2023-07-11 NOTE — Discharge Instructions (Signed)
I have refilled your telmisartan and your metformin.  It appears you do have a refill of your metformin available at the [facility] pharmacy off of [address].  Please contact your primary care provider on Monday and see if they are willing to send you in refills until you can see them again in February, when you get insurance.  If they are not able to see you or you no longer to wish to continue care with them you can reach out to [facility] health community health and wellness to get scheduled for an appointment.

## 2023-07-11 NOTE — ED Provider Notes (Signed)
[facility]    CSN: [id number] Arrival date & time: 07/11/23  1748      History   Chief Complaint Chief Complaint  Patient presents with   Medication Refill    HPI Cory Little is a 37 y.o. male.   Patient presents to clinic requesting a medication refill of telmisartan and metformin.  He would like a paper prescription because he does not have insurance and asked to shop around for the cheapest prices.  He does currently have a primary care provider, has not reached out to them regarding refills.  He is hypertensive.  He does not have any headache or vision changes.  Reports he is in between jobs and he gets insurance with his new job starting in February.   The history is provided by the patient and medical records.  Medication Refill   Past Medical History:  Diagnosis Date   ADD (attention deficit disorder)    Chest pain    Chest tightness    Depression    GERD (gastroesophageal reflux disease)    Hypertension    Intolerance to cold    Kidney stone 2004   Kidney stone    Pre-diabetes    Sleep apnea     Patient Active Problem List   Diagnosis Date Noted   Morbid obesity (HCC) 02/13/2023   Abnormal electrocardiogram (ECG) (EKG) 12/30/2022   Encounter for general adult medical examination with abnormal findings 02/26/2021   Drug-induced erectile dysfunction 06/22/2019   Diuretic-induced hypokalemia 03/11/2019   Current moderate episode of major depressive disorder (HCC) 01/06/2018   Vitamin D deficiency 09/22/2017   Obstructive sleep apnea syndrome 09/08/2017   Marijuana abuse 08/21/2017   Essential hypertension 08/14/2017   Hypertensive left ventricular hypertrophy, without heart failure 08/14/2017   OSA on CPAP 11/23/2015   Attention deficit hyperactivity disorder (ADHD) 03/06/2010   GERD 02/17/2009   Insomnia with sleep apnea 02/16/2009    Past Surgical History:  Procedure Laterality Date   FACIAL COSMETIC SURGERY     facial  liposuction     KIDNEY STONE SURGERY     WISDOM TOOTH EXTRACTION  2016       Home Medications    Prior to Admission medications   Medication Sig Start Date End Date Taking? Authorizing Provider  amphetamine-dextroamphetamine (ADDERALL) 10 MG tablet Take 1 tablet (10 mg total) by mouth daily with breakfast. 03/03/23  Yes Etta Grandchild, MD  busPIRone (BUSPAR) 10 MG tablet Take 1 tablet (10 mg total) by mouth 3 (three) times daily. 08/27/21  Yes Etta Grandchild, MD  Insulin Pen Needle 32G X 6 MM MISC 1 Act by Does not apply route once a week. 02/13/23  Yes Etta Grandchild, MD  metFORMIN (GLUCOPHAGE) 500 MG tablet Take 1 tablet (500 mg total) by mouth 2 (two) times daily with a meal. 07/11/23 08/10/23 Yes Rinaldo Ratel, [place], FNP  metFORMIN (GLUCOPHAGE-XR) 500 MG 24 hr tablet Take 1 tablet (500 mg total) by mouth daily with breakfast. 02/13/23  Yes Etta Grandchild, MD  phentermine 37.5 MG capsule Take 1 capsule (37.5 mg total) by mouth every morning. 03/03/23  Yes Etta Grandchild, MD  potassium chloride (KLOR-CON 10) 10 MEQ tablet Take 1 tablet (10 mEq total) by mouth 2 (two) times daily. 12/30/22  Yes Etta Grandchild, MD  telmisartan (MICARDIS) 40 MG tablet Take 1 tablet (40 mg total) by mouth daily. 03/03/23  Yes Etta Grandchild, MD  telmisartan (MICARDIS) 40 MG tablet Take  1 tablet (40 mg total) by mouth daily. 07/11/23  Yes Rinaldo Ratel, [place], FNP  tadalafil (CIALIS) 20 MG tablet Take 1 tablet (20 mg total) by mouth every other day as needed for erectile dysfunction. 02/14/23   Etta Grandchild, MD  triamterene-hydrochlorothiazide (DYAZIDE) 37.5-25 MG capsule Take 1 each (1 capsule total) by mouth daily. 12/30/22   Etta Grandchild, MD    Family History Family History  Problem Relation Age of Onset   Sleep apnea Father    Depression Mother     Social History Social History   Tobacco Use   Smoking status: Former    Types: Cigarettes   Smokeless tobacco: Never  Vaping Use   Vaping status:  Never Used  Substance Use Topics   Alcohol use: Yes    Comment: occasionally   Drug use: Never     Allergies   Wellbutrin [bupropion]   Review of Systems Review of Systems  Per HPI   Physical Exam Triage Vital Signs ED Triage Vitals [07/11/23 1802]  Encounter Vitals Group     BP (!) 158/102     Systolic BP Percentile      Diastolic BP Percentile      Pulse Rate 93     Resp 16     Temp 98.4 F (36.9 C)     Temp Source Oral     SpO2 97 %     Weight      Height      Head Circumference      Peak Flow      Pain Score      Pain Loc      Pain Education      Exclude from Growth Chart    No data found.  Updated Vital Signs BP (!) 158/102 (BP Location: Left Arm)   Pulse 93   Temp 98.4 F (36.9 C) (Oral)   Resp 16   SpO2 97%   Visual Acuity Right Eye Distance:   Left Eye Distance:   Bilateral Distance:    Right Eye Near:   Left Eye Near:    Bilateral Near:     Physical Exam Vitals and nursing note reviewed.  Constitutional:      Appearance: Normal appearance.  HENT:     Head: Normocephalic and atraumatic.     Right Ear: External ear normal.     Left Ear: External ear normal.     Nose: Nose normal.     Mouth/Throat:     Mouth: Mucous membranes are moist.  Eyes:     Conjunctiva/sclera: Conjunctivae normal.  Cardiovascular:     Rate and Rhythm: Normal rate and regular rhythm.     Heart sounds: Normal heart sounds. No murmur heard. Pulmonary:     Effort: Pulmonary effort is normal. No respiratory distress.     Breath sounds: Normal breath sounds.  Musculoskeletal:        General: Normal range of motion.  Skin:    General: Skin is warm and dry.  Neurological:     General: No focal deficit present.     Mental Status: He is alert and oriented to person, place, and time.  Psychiatric:        Mood and Affect: Mood normal.        Behavior: Behavior normal.      UC Treatments / Results  Labs (all labs ordered are listed, but only abnormal  results are displayed) Labs Reviewed - No data to display  EKG  Radiology No results found.  Procedures Procedures (including critical care time)  Medications Ordered in UC Medications - No data to display  Initial Impression / Assessment and Plan / UC Course  I have reviewed the triage vital signs and the nursing notes.  Pertinent labs & imaging results that were available during my care of the patient were reviewed by me and considered in my medical decision making (see chart for details).  Vitals and triage reviewed, patient is hemodynamically stable.  Lungs are vesicular, heart with regular rate and rhythm.  Metabolic panel drawn 4 months ago was within normal limits.  Will prescribe telmisartan and refill metformin for 30 days.  Encouraged to follow-up with PCP for further refills.  Plan of care, follow-up care return precautions given, no questions at this time.     Final Clinical Impressions(s) / UC Diagnoses   Final diagnoses:  Medication refill  Essential hypertension  Type 2 diabetes mellitus with hyperglycemia, without long-term current use of insulin (HCC)     Discharge Instructions      I have refilled your telmisartan and your metformin.  It appears you do have a refill of your metformin available at the [facility] pharmacy off of [address].  Please contact your primary care provider on Monday and see if they are willing to send you in refills until you can see them again in February, when you get insurance.  If they are not able to see you or you no longer to wish to continue care with them you can reach out to [facility] health community health and wellness to get scheduled for an appointment.     ED Prescriptions     Medication Sig Dispense Auth. Provider   metFORMIN (GLUCOPHAGE) 500 MG tablet Take 1 tablet (500 mg total) by mouth 2 (two) times daily with a meal. 60 tablet Rinaldo Ratel, [place], FNP   telmisartan (MICARDIS) 40 MG tablet Take 1 tablet (40  mg total) by mouth daily. 30 tablet Lynk Marti, [place], [place]      PDMP not reviewed this encounter.   Kemyah Buser, [place] N, [place] 07/11/23 [phone]

## 2023-07-11 NOTE — ED Triage Notes (Signed)
Pt is here for med-refill on Telmisartan and Metformin. Pt would like paper prescription.

## 2023-08-10 ENCOUNTER — Other Ambulatory Visit: Payer: Self-pay | Admitting: Internal Medicine

## 2023-10-03 ENCOUNTER — Other Ambulatory Visit: Payer: Self-pay | Admitting: Internal Medicine

## 2023-10-13 ENCOUNTER — Other Ambulatory Visit: Payer: Self-pay | Admitting: Internal Medicine

## 2023-10-13 DIAGNOSIS — I10 Essential (primary) hypertension: Secondary | ICD-10-CM

## 2023-10-13 DIAGNOSIS — I119 Hypertensive heart disease without heart failure: Secondary | ICD-10-CM

## 2023-10-13 MED ORDER — TELMISARTAN 40 MG PO TABS: 40.0000 mg | ORAL_TABLET | Freq: Every day | ORAL | 0 refills | Status: DC

## 2023-10-13 MED ORDER — METFORMIN HCL ER 500 MG PO TB24: 500.0000 mg | ORAL_TABLET | Freq: Every day | ORAL | 0 refills | Status: DC

## 2023-10-13 NOTE — Telephone Encounter (Signed)
 Copied from CRM [id number]. Topic: Clinical - Medication Refill >> Oct 13, 2023  1:43 PM Elizebeth Brooking wrote: Most Recent Primary Care Visit:  Provider: Etta Grandchild  Department: [facility]  Visit Type: OFFICE VISIT  Date: 03/03/2023  Medication:  metFORMIN (GLUCOPHAGE-XR) 500 MG 24 hr tablet telmisartan (MICARDIS) 40 MG tablet  Has the patient contacted their pharmacy? Yes (Agent: If no, request that the patient contact the pharmacy for the refill. If patient does not wish to contact the pharmacy document the reason why and proceed with request.) (Agent: If yes, when and what did the pharmacy advise?)  Is this the correct pharmacy for this prescription? Yes If no, delete pharmacy and type the correct one.  This is the patient's preferred pharmacy:    [id number] - [place], [place] - [address] [place] [address] Phone: [phone] Fax: [phone]   Has the prescription been filled recently? No  Is the patient out of the medication? Yes  Has the patient been seen for an appointment in the last year OR does the patient have an upcoming appointment? Yes  Can we respond through MyChart? Yes  Agent: Please be advised that Rx refills may take up to 3 business days. We ask that you follow-up with your pharmacy.

## 2023-11-06 ENCOUNTER — Ambulatory Visit (INDEPENDENT_AMBULATORY_CARE_PROVIDER_SITE_OTHER): Payer: Self-pay | Admitting: Internal Medicine

## 2023-11-06 ENCOUNTER — Telehealth: Payer: Self-pay | Admitting: Pharmacist

## 2023-11-06 ENCOUNTER — Encounter: Payer: Self-pay | Admitting: Internal Medicine

## 2023-11-06 VITALS — BP 158/116 | HR 91 | Temp 98.7°F | Resp 16 | Ht 70.0 in | Wt 285.6 lb

## 2023-11-06 DIAGNOSIS — I10 Essential (primary) hypertension: Secondary | ICD-10-CM | POA: Diagnosis not present

## 2023-11-06 DIAGNOSIS — Z6841 Body Mass Index (BMI) 40.0 and over, adult: Secondary | ICD-10-CM | POA: Diagnosis not present

## 2023-11-06 DIAGNOSIS — E1165 Type 2 diabetes mellitus with hyperglycemia: Secondary | ICD-10-CM

## 2023-11-06 DIAGNOSIS — F411 Generalized anxiety disorder: Secondary | ICD-10-CM

## 2023-11-06 DIAGNOSIS — G4733 Obstructive sleep apnea (adult) (pediatric): Secondary | ICD-10-CM

## 2023-11-06 DIAGNOSIS — R9431 Abnormal electrocardiogram [ECG] [EKG]: Secondary | ICD-10-CM | POA: Diagnosis not present

## 2023-11-06 DIAGNOSIS — Z7984 Long term (current) use of oral hypoglycemic drugs: Secondary | ICD-10-CM

## 2023-11-06 LAB — URINALYSIS, ROUTINE W REFLEX MICROSCOPIC
Bilirubin Urine: NEGATIVE
Hgb urine dipstick: NEGATIVE
Ketones, ur: NEGATIVE
Leukocytes,Ua: NEGATIVE
Nitrite: NEGATIVE
RBC / HPF: NONE SEEN (ref 0–?)
Specific Gravity, Urine: 1.02 (ref 1.000–1.030)
Urine Glucose: 500 — AB
Urobilinogen, UA: 0.2 (ref 0.0–1.0)
pH: 6 (ref 5.0–8.0)

## 2023-11-06 LAB — HEPATIC FUNCTION PANEL
ALT: 40 U/L (ref 0–53)
AST: 24 U/L (ref 0–37)
Albumin: 4.6 g/dL (ref 3.5–5.2)
Alkaline Phosphatase: 73 U/L (ref 39–117)
Bilirubin, Direct: 0.1 mg/dL (ref 0.0–0.3)
Total Bilirubin: 0.6 mg/dL (ref 0.2–1.2)
Total Protein: 7.3 g/dL (ref 6.0–8.3)

## 2023-11-06 LAB — CBC WITH DIFFERENTIAL/PLATELET
Basophils Absolute: 0.1 10*3/uL (ref 0.0–0.1)
Basophils Relative: 1.7 % (ref 0.0–3.0)
Eosinophils Absolute: 0.1 10*3/uL (ref 0.0–0.7)
Eosinophils Relative: 1.7 % (ref 0.0–5.0)
HCT: 43.9 % (ref 39.0–52.0)
Hemoglobin: 15.4 g/dL (ref 13.0–17.0)
Lymphocytes Relative: 34.8 % (ref 12.0–46.0)
Lymphs Abs: 2.7 10*3/uL (ref 0.7–4.0)
MCHC: 35.1 g/dL (ref 30.0–36.0)
MCV: 85.4 fl (ref 78.0–100.0)
Monocytes Absolute: 0.7 10*3/uL (ref 0.1–1.0)
Monocytes Relative: 9.6 % (ref 3.0–12.0)
Neutro Abs: 4.1 10*3/uL (ref 1.4–7.7)
Neutrophils Relative %: 52.2 % (ref 43.0–77.0)
Platelets: 270 10*3/uL (ref 150.0–400.0)
RBC: 5.13 Mil/uL (ref 4.22–5.81)
RDW: 13.5 % (ref 11.5–15.5)
WBC: 7.8 10*3/uL (ref 4.0–10.5)

## 2023-11-06 LAB — BASIC METABOLIC PANEL WITH GFR
BUN: 13 mg/dL (ref 6–23)
CO2: 28 meq/L (ref 19–32)
Calcium: 9.2 mg/dL (ref 8.4–10.5)
Chloride: 103 meq/L (ref 96–112)
Creatinine, Ser: 1.14 mg/dL (ref 0.40–1.50)
GFR: 80.92 mL/min (ref >60.00)
Glucose, Bld: 77 mg/dL (ref 70–99)
Potassium: 3.2 meq/L — ABNORMAL LOW (ref 3.5–5.1)
Sodium: 140 meq/L (ref 135–145)

## 2023-11-06 LAB — HEMOGLOBIN A1C: Hgb A1c MFr Bld: 6.5 % (ref 4.6–6.5)

## 2023-11-06 LAB — TROPONIN I (HIGH SENSITIVITY): High Sens Troponin I: 11 ng/L (ref 2–17)

## 2023-11-06 LAB — BRAIN NATRIURETIC PEPTIDE: Pro B Natriuretic peptide (BNP): 9 pg/mL (ref 0.0–100.0)

## 2023-11-06 LAB — TSH: TSH: 1.26 u[IU]/mL (ref 0.35–5.50)

## 2023-11-06 MED ORDER — METFORMIN HCL ER 750 MG PO TB24: 750.0000 mg | ORAL_TABLET | Freq: Every day | ORAL | 0 refills | Status: DC

## 2023-11-06 MED ORDER — TRIAMTERENE-HCTZ 37.5-25 MG PO CAPS: 1.0000 | ORAL_CAPSULE | Freq: Every day | ORAL | 0 refills | Status: DC

## 2023-11-06 MED ORDER — BUSPIRONE HCL 10 MG PO TABS: 10.0000 mg | ORAL_TABLET | Freq: Three times a day (TID) | ORAL | 0 refills | Status: DC

## 2023-11-06 MED ORDER — ZEPBOUND 2.5 MG/0.5ML ~~LOC~~ SOAJ: 2.5000 mg | SUBCUTANEOUS | 0 refills | Status: DC

## 2023-11-06 NOTE — Patient Instructions (Signed)
 Hypertension, Adult High blood pressure (hypertension) is when the force of blood pumping through the arteries is too strong. The arteries are the blood vessels that carry blood from the heart throughout the body. Hypertension forces the heart to work harder to pump blood and may cause arteries to become narrow or stiff. Untreated or uncontrolled hypertension can lead to a heart attack, heart failure, a stroke, kidney disease, and other problems. A blood pressure reading consists of a higher number over a lower number. Ideally, your blood pressure should be below 120/80. The first ("top") number is called the systolic pressure. It is a measure of the pressure in your arteries as your heart beats. The second ("bottom") number is called the diastolic pressure. It is a measure of the pressure in your arteries as the heart relaxes. What are the causes? The exact cause of this condition is not known. There are some conditions that result in high blood pressure. What increases the risk? Certain factors may make you more likely to develop high blood pressure. Some of these risk factors are under your control, including: Smoking. Not getting enough exercise or physical activity. Being overweight. Having too much fat, sugar, calories, or salt (sodium) in your diet. Drinking too much alcohol. Other risk factors include: Having a personal history of heart disease, diabetes, high cholesterol, or kidney disease. Stress. Having a family history of high blood pressure and high cholesterol. Having obstructive sleep apnea. Age. The risk increases with age. What are the signs or symptoms? High blood pressure may not cause symptoms. Very high blood pressure (hypertensive crisis) may cause: Headache. Fast or irregular heartbeats (palpitations). Shortness of breath. Nosebleed. Nausea and vomiting. Vision changes. Severe chest pain, dizziness, and seizures. How is this diagnosed? This condition is diagnosed by  measuring your blood pressure while you are seated, with your arm resting on a flat surface, your legs uncrossed, and your feet flat on the floor. The cuff of the blood pressure monitor will be placed directly against the skin of your upper arm at the level of your heart. Blood pressure should be measured at least twice using the same arm. Certain conditions can cause a difference in blood pressure between your right and left arms. If you have a high blood pressure reading during one visit or you have normal blood pressure with other risk factors, you may be asked to: Return on a different day to have your blood pressure checked again. Monitor your blood pressure at home for 1 week or longer. If you are diagnosed with hypertension, you may have other blood or imaging tests to help your health care provider understand your overall risk for other conditions. How is this treated? This condition is treated by making healthy lifestyle changes, such as eating healthy foods, exercising more, and reducing your alcohol intake. You may be referred for counseling on a healthy diet and physical activity. Your health care provider may prescribe medicine if lifestyle changes are not enough to get your blood pressure under control and if: Your systolic blood pressure is above 130. Your diastolic blood pressure is above 80. Your personal target blood pressure may vary depending on your medical conditions, your age, and other factors. Follow these instructions at home: Eating and drinking  Eat a diet that is high in fiber and potassium, and low in sodium, added sugar, and fat. An example of this eating plan is called the DASH diet. DASH stands for Dietary Approaches to Stop Hypertension. To eat this way: Eat  plenty of fresh fruits and vegetables. Try to fill one half of your plate at each meal with fruits and vegetables. Eat whole grains, such as whole-wheat pasta, brown rice, or whole-grain bread. Fill about one  fourth of your plate with whole grains. Eat or drink low-fat dairy products, such as skim milk or low-fat yogurt. Avoid fatty cuts of meat, processed or cured meats, and poultry with skin. Fill about one fourth of your plate with lean proteins, such as fish, chicken without skin, beans, eggs, or tofu. Avoid pre-made and processed foods. These tend to be higher in sodium, added sugar, and fat. Reduce your daily sodium intake. Many people with hypertension should eat less than 1,500 mg of sodium a day. Do not drink alcohol if: Your health care provider tells you not to drink. You are pregnant, may be pregnant, or are planning to become pregnant. If you drink alcohol: Limit how much you have to: 0-1 drink a day for women. 0-2 drinks a day for men. Know how much alcohol is in your drink. In the [place] one drink equals one 12 oz bottle of beer (355 mL), one 5 oz glass of wine (148 mL), or one 1 oz glass of hard liquor (44 mL). Lifestyle  Work with your health care provider to maintain a healthy body weight or to lose weight. Ask what an ideal weight is for you. Get at least 30 minutes of exercise that causes your heart to beat faster (aerobic exercise) most days of the week. Activities may include walking, swimming, or biking. Include exercise to strengthen your muscles (resistance exercise), such as Pilates or lifting weights, as part of your weekly exercise routine. Try to do these types of exercises for 30 minutes at least 3 days a week. Do not use any products that contain nicotine or tobacco. These products include cigarettes, chewing tobacco, and vaping devices, such as e-cigarettes. If you need help quitting, ask your health care provider. Monitor your blood pressure at home as told by your health care provider. Keep all follow-up visits. This is important. Medicines Take over-the-counter and prescription medicines only as told by your health care provider. Follow directions carefully. Blood  pressure medicines must be taken as prescribed. Do not skip doses of blood pressure medicine. Doing this puts you at risk for problems and can make the medicine less effective. Ask your health care provider about side effects or reactions to medicines that you should watch for. Contact a health care provider if you: Think you are having a reaction to a medicine you are taking. Have headaches that keep coming back (recurring). Feel dizzy. Have swelling in your ankles. Have trouble with your vision. Get help right away if you: Develop a severe headache or confusion. Have unusual weakness or numbness. Feel faint. Have severe pain in your chest or abdomen. Vomit repeatedly. Have trouble breathing. These symptoms may be an emergency. Get help right away. Call 911. Do not wait to see if the symptoms will go away. Do not drive yourself to the hospital. Summary Hypertension is when the force of blood pumping through your arteries is too strong. If this condition is not controlled, it may put you at risk for serious complications. Your personal target blood pressure may vary depending on your medical conditions, your age, and other factors. For most people, a normal blood pressure is less than 120/80. Hypertension is treated with lifestyle changes, medicines, or a combination of both. Lifestyle changes include losing weight, eating a healthy,  low-sodium diet, exercising more, and limiting alcohol. This information is not intended to replace advice given to you by your health care provider. Make sure you discuss any questions you have with your health care provider. Document Revised: 05/22/2021 Document Reviewed: 05/22/2021 Elsevier Patient Education  2024 [employer].

## 2023-11-06 NOTE — Telephone Encounter (Signed)
 Pharmacy Patient Advocate Encounter   Received notification from Patient Pharmacy that prior authorization for Zepbound 2.5MG /0.5ML pen-injectors is required/requested.   Insurance verification completed.   The patient is insured through [facility] .   Per test claim: PA required; PA submitted to above mentioned insurance via CoverMyMeds Key/confirmation #/[id number] Status is pending

## 2023-11-06 NOTE — Progress Notes (Signed)
 Subjective:  Patient ID: Cory Little, male    DOB: [birth year]  Age: 38 y.o. MRN: [id number]  CC: Hypertension   HPI Cory Little presents for f/up ----  Discussed the use of AI scribe software for clinical note transcription with the patient, who gave verbal consent to proceed.  History of Present Illness   Cory Little is a 38 year old male with hypertension who presents with elevated blood pressure.  He presents with elevated blood pressure, recorded at 186/146 mmHg during the visit. He did not take his telmisartan medication this morning, which he usually takes at night. No headache, blurred vision, chest pain, shortness of breath, or swelling in the legs or feet. His blood pressure was previously recorded at 138/84 mmHg in August of the previous year.  He is currently taking telmisartan for hypertension, metformin for diabetes, turmeric vitamins, lutein, and plans to purchase more Centrum vitamins. He was previously on a diuretic, triamterene/hydrochlorothiazide, but believes it was discontinued. He prefers taking metformin twice a day instead of once, as he feels it helps manage his weight better. No significant side effects from metformin.  He reports recent weight gain, from 276 pounds last summer to 286 pounds currently, and notes that he has started snoring again, despite having had a septoplasty five years ago to address sleep apnea symptoms. He lives alone but uses an app to monitor his snoring, which recently recorded three hours of heavy snoring in one night.  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 No chest pain, shortness of breath, dizziness, or lightheadedness during these activities.  He wants to resume medications such as buspirone for anxiety and Adderall for work-related focus, though he acknowledges his current blood pressure is too high for Adderall use.   He mentions a family  history of hypertension, possibly on his father's side, who is currently in memory care.       Outpatient Medications Prior to Visit  Medication Sig Dispense Refill   potassium chloride (KLOR-CON 10) 10 MEQ tablet Take 1 tablet (10 mEq total) by mouth 2 (two) times daily. 180 tablet 0   tadalafil (CIALIS) 20 MG tablet Take 1 tablet (20 mg total) by mouth every other day as needed for erectile dysfunction. 10 tablet 3   telmisartan (MICARDIS) 40 MG tablet Take 1 tablet (40 mg total) by mouth daily. 90 tablet 0   amphetamine-dextroamphetamine (ADDERALL) 10 MG tablet Take 1 tablet (10 mg total) by mouth daily with breakfast. 30 tablet 0   busPIRone (BUSPAR) 10 MG tablet Take 1 tablet (10 mg total) by mouth 3 (three) times daily. 270 tablet 0   Insulin Pen Needle 32G X 6 MM MISC 1 Act by Does not apply route once a week. 50 each 1   metFORMIN (GLUCOPHAGE) 500 MG tablet Take 1 tablet (500 mg total) by mouth 2 (two) times daily with a meal. 60 tablet 0   metFORMIN (GLUCOPHAGE-XR) 500 MG 24 hr tablet Take 1 tablet (500 mg total) by mouth daily with breakfast. 90 tablet 0   phentermine 37.5 MG capsule Take 1 capsule (37.5 mg total) by mouth every morning. 30 capsule 0   telmisartan (MICARDIS) 40 MG tablet Take 1 tablet (40 mg total) by mouth daily. 30 tablet 0   triamterene-hydrochlorothiazide (DYAZIDE) 37.5-25 MG capsule Take 1 each (1 capsule total) by mouth daily. 90 capsule 0   No facility-administered medications prior to visit.  ROS Review of Systems  Constitutional:  Positive for unexpected weight change. Negative for appetite change, chills, diaphoresis, fatigue and fever.  HENT: Negative.    Eyes:  Negative for visual disturbance.  Respiratory:  Positive for apnea. Negative for cough, choking, shortness of breath and wheezing.   Cardiovascular:  Negative for chest pain, palpitations and leg swelling.  Gastrointestinal: Negative.  Negative for abdominal pain, diarrhea, nausea and  vomiting.  Genitourinary: Negative.  Negative for difficulty urinating and hematuria.  Musculoskeletal: Negative.  Negative for arthralgias.  Skin: Negative.  Negative for color change.  Neurological: Negative.  Negative for dizziness, weakness and headaches.  Hematological:  Negative for adenopathy. Does not bruise/bleed easily.  Psychiatric/Behavioral:  Positive for decreased concentration. Negative for confusion, dysphoric mood and sleep disturbance. The patient is nervous/anxious.     Objective:  BP (!) 158/116 (BP Location: Left Arm, Patient Position: Sitting, Cuff Size: Large) Comment: BP (R) 160/116  Pulse 91   Temp 98.7 F (37.1 C) (Temporal)   Resp 16   Ht 5\' 10"  (1.778 m)   Wt 285 lb 9.6 oz (129.5 kg)   SpO2 97%   BMI 40.98 kg/m   BP Readings from Last 3 Encounters:  11/06/23 (!) 158/116  07/11/23 (!) 158/102  03/03/23 138/84    Wt Readings from Last 3 Encounters:  11/06/23 285 lb 9.6 oz (129.5 kg)  03/03/23 276 lb (125.2 kg)  02/13/23 276 lb (125.2 kg)    Physical Exam Vitals reviewed.  Constitutional:      General: He is not in acute distress.    Appearance: He is obese. He is not toxic-appearing or diaphoretic.  HENT:     Nose: Nose normal.     Mouth/Throat:     Mouth: Mucous membranes are moist.  Eyes:     General: No scleral icterus.    Conjunctiva/sclera: Conjunctivae normal.  Cardiovascular:     Rate and Rhythm: Normal rate and regular rhythm.     Heart sounds: No murmur heard.    No friction rub. No gallop.     Comments: EKG--- NSR, 82 bpm LAD ?LVH Lateral T wave changes Unchanged  Pulmonary:     Effort: Pulmonary effort is normal.     Breath sounds: No stridor. No wheezing, rhonchi or rales.  Abdominal:     General: Abdomen is protuberant. Bowel sounds are normal. There is no distension.     Palpations: Abdomen is soft. There is no hepatomegaly, splenomegaly or mass.     Tenderness: There is no abdominal tenderness.  Musculoskeletal:         General: Normal range of motion.     Cervical back: Neck supple.     Right lower leg: No edema.     Left lower leg: No edema.  Skin:    General: Skin is warm and dry.  Neurological:     General: No focal deficit present.     Mental Status: He is alert. Mental status is at baseline.  Psychiatric:        Attention and Perception: He is inattentive.        Mood and Affect: Mood is anxious. Mood is not depressed. Affect is not flat or tearful.        Speech: Speech normal.        Behavior: Behavior normal. Behavior is cooperative.        Thought Content: Thought content normal. Thought content is not paranoid or delusional. Thought content does not include homicidal or suicidal  ideation.        Cognition and Memory: Cognition normal.     Lab Results  Component Value Date   WBC 7.8 11/06/2023   HGB 15.4 11/06/2023   HCT 43.9 11/06/2023   PLT 270.0 11/06/2023   GLUCOSE 77 11/06/2023   CHOL 214 (H) 12/30/2022   TRIG 206.0 (H) 12/30/2022   HDL 37.40 (L) 12/30/2022   LDLDIRECT 159.0 12/30/2022   LDLCALC 134 (H) 03/11/2019   ALT 40 11/06/2023   AST 24 11/06/2023   NA 140 11/06/2023   K 3.2 (L) 11/06/2023   CL 103 11/06/2023   CREATININE 1.14 11/06/2023   BUN 13 11/06/2023   CO2 28 11/06/2023   TSH 1.26 11/06/2023   HGBA1C 6.5 11/06/2023    No results found.  Assessment & Plan:  Hypertension, unspecified type -     EKG 12-Lead  Morbid obesity (HCC) -     TSH; Future -     Hepatic function panel; Future -     Hemoglobin A1c; Future -     Zepbound; Inject 2.5 mg into the skin once a week.  Dispense: 4 mL; Refill: 0  Abnormal electrocardiogram (ECG) (EKG) -     Brain natriuretic peptide; Future -     Troponin I (High Sensitivity); Future  Obstructive sleep apnea syndrome -     Ambulatory referral to Sleep Studies -     Zepbound; Inject 2.5 mg into the skin once a week.  Dispense: 4 mL; Refill: 0  Malignant hypertension- His BP is not at gaol. Labs are negative  for secondary causes/end organ damage. Will evaluate with a renal ultrasound. Will add dyazide to the ARB. -     Basic metabolic panel with GFR; Future -     CBC with Differential/Platelet; Future -     TSH; Future -     Urinalysis, Routine w reflex microscopic; Future -     Hepatic function panel; Future -     Aldosterone + renin activity w/ ratio; Future -     Urine drugs of abuse scrn w alc, routine (Ref Lab); Future -     Triamterene-HCTZ; Take 1 each (1 capsule total) by mouth daily.  Dispense: 30 capsule; Refill: 0 -     Drug Screen, Comprehensive, with Confirmation, Urine; Future -     US  RENAL; Future  Type 2 diabetes mellitus with hyperglycemia, without long-term current use of insulin (HCC) -     metFORMIN HCl ER; Take 1 tablet (750 mg total) by mouth daily with breakfast.  Dispense: 30 tablet; Refill: 0  GAD (generalized anxiety disorder) -     busPIRone HCl; Take 1 tablet (10 mg total) by mouth 3 (three) times daily.  Dispense: 270 tablet; Refill: 0  Other orders -     Specimen status report     Follow-up: Return in about 4 weeks (around 12/04/2023).  Sandra Crouch, MD

## 2023-11-07 ENCOUNTER — Telehealth: Payer: Self-pay | Admitting: Internal Medicine

## 2023-11-07 NOTE — Telephone Encounter (Signed)
 Copied from CRM [id number]. Topic: Clinical - Request for Lab/Test Order >> Nov 07, 2023  2:28 PM [place] J wrote: Reason for CRM: Darreld Mclean calling in from [facility] in regards to a drug screen that was ordered. She stated that the drug screen is non orderable and another drug screen test will need to be ordered.  [phone] [address] [facility] Ext. [phone]

## 2023-11-07 NOTE — Telephone Encounter (Signed)
 Is there a new code that I can use?

## 2023-11-07 NOTE — Telephone Encounter (Signed)
 Pharmacy Patient Advocate Encounter  Received notification from [facility] that Prior Authorization for Zepbound 2.5MG /0.5ML pen-injectors has been DENIED.  See denial reason below. No denial letter attached in CMM. Will attach denial letter to Media tab once received.   PA #/Case ID/Reference #: [id number]    Request Reference Number: [id number]. ZEPBOUND INJ 2.5/0.5 is denied due to Plan Exclusion.

## 2023-11-07 NOTE — Telephone Encounter (Signed)
 Please advise. [facility] need you to reorder this test because the one you ordered is "No orderable" as of 10/27/2023.

## 2023-11-07 NOTE — Telephone Encounter (Signed)
 Unable to reach patient unable to [facility]

## 2023-11-10 ENCOUNTER — Encounter: Payer: Self-pay | Admitting: Internal Medicine

## 2023-11-10 ENCOUNTER — Other Ambulatory Visit (HOSPITAL_COMMUNITY): Payer: Self-pay

## 2023-11-10 LAB — ALDOSTERONE + RENIN ACTIVITY W/ RATIO
ALDO / PRA Ratio: 14.1 ratio (ref 0.9–28.9)
Aldosterone: 11 ng/dL
Renin Activity: 0.78 ng/mL/h (ref 0.25–5.82)

## 2023-11-10 LAB — SPECIMEN STATUS REPORT

## 2023-11-10 NOTE — Telephone Encounter (Signed)
 I re-ordered to [facility]

## 2023-11-10 NOTE — Telephone Encounter (Signed)
 [facility] says that the only other test is a 9 panel with alcohol and oxycodone and that test code is [phone]. Will you be willing to use this test ? This could possibly be more expensive due to them testing for oxycodone. Do this test have to be through [facility] or can we send this to [facility] ?

## 2023-11-11 ENCOUNTER — Encounter: Payer: Self-pay | Admitting: Internal Medicine

## 2023-11-11 LAB — SPECIMEN STATUS REPORT

## 2023-11-11 NOTE — Telephone Encounter (Signed)
 Patient has been made aware. Gave a verbal understanding.

## 2023-11-17 ENCOUNTER — Ambulatory Visit (HOSPITAL_BASED_OUTPATIENT_CLINIC_OR_DEPARTMENT_OTHER)
Admission: RE | Admit: 2023-11-17 | Discharge: 2023-11-17 | Disposition: A | Discharge status: Home / Self Care | Source: Ambulatory Visit | Attending: Internal Medicine | Admitting: Internal Medicine

## 2023-11-17 ENCOUNTER — Ambulatory Visit (HOSPITAL_BASED_OUTPATIENT_CLINIC_OR_DEPARTMENT_OTHER)

## 2023-11-17 DIAGNOSIS — I10 Essential (primary) hypertension: Secondary | ICD-10-CM | POA: Insufficient documentation

## 2023-12-02 ENCOUNTER — Other Ambulatory Visit: Payer: Self-pay | Admitting: Internal Medicine

## 2023-12-02 DIAGNOSIS — I10 Essential (primary) hypertension: Secondary | ICD-10-CM

## 2023-12-04 ENCOUNTER — Encounter (HOSPITAL_COMMUNITY): Payer: Self-pay

## 2023-12-11 ENCOUNTER — Ambulatory Visit: Admitting: Internal Medicine

## 2023-12-11 ENCOUNTER — Encounter: Payer: Self-pay | Admitting: Internal Medicine

## 2023-12-11 ENCOUNTER — Telehealth: Payer: Self-pay

## 2023-12-11 VITALS — BP 146/94 | HR 87 | Temp 98.2°F | Resp 16 | Ht 70.0 in | Wt 288.0 lb

## 2023-12-11 DIAGNOSIS — E876 Hypokalemia: Secondary | ICD-10-CM

## 2023-12-11 DIAGNOSIS — F1210 Cannabis abuse, uncomplicated: Secondary | ICD-10-CM

## 2023-12-11 DIAGNOSIS — I119 Hypertensive heart disease without heart failure: Secondary | ICD-10-CM

## 2023-12-11 DIAGNOSIS — T502X5A Adverse effect of carbonic-anhydrase inhibitors, benzothiadiazides and other diuretics, initial encounter: Secondary | ICD-10-CM

## 2023-12-11 DIAGNOSIS — E119 Type 2 diabetes mellitus without complications: Secondary | ICD-10-CM | POA: Insufficient documentation

## 2023-12-11 DIAGNOSIS — I10 Essential (primary) hypertension: Secondary | ICD-10-CM

## 2023-12-11 DIAGNOSIS — Z23 Encounter for immunization: Secondary | ICD-10-CM | POA: Diagnosis not present

## 2023-12-11 DIAGNOSIS — Z7984 Long term (current) use of oral hypoglycemic drugs: Secondary | ICD-10-CM

## 2023-12-11 DIAGNOSIS — E781 Pure hyperglyceridemia: Secondary | ICD-10-CM | POA: Diagnosis not present

## 2023-12-11 DIAGNOSIS — E1169 Type 2 diabetes mellitus with other specified complication: Secondary | ICD-10-CM

## 2023-12-11 LAB — BASIC METABOLIC PANEL WITH GFR
BUN: 16 mg/dL (ref 6–23)
CO2: 28 meq/L (ref 19–32)
Calcium: 9.3 mg/dL (ref 8.4–10.5)
Chloride: 103 meq/L (ref 96–112)
Creatinine, Ser: 1.31 mg/dL (ref 0.40–1.50)
GFR: 68.44 mL/min (ref >60.00)
Glucose, Bld: 174 mg/dL — ABNORMAL HIGH (ref 70–99)
Potassium: 3.3 meq/L — ABNORMAL LOW (ref 3.5–5.1)
Sodium: 138 meq/L (ref 135–145)

## 2023-12-11 LAB — URINALYSIS, ROUTINE W REFLEX MICROSCOPIC
Bilirubin Urine: NEGATIVE
Hgb urine dipstick: NEGATIVE
Ketones, ur: NEGATIVE
Leukocytes,Ua: NEGATIVE
Nitrite: NEGATIVE
RBC / HPF: NONE SEEN (ref 0–?)
Specific Gravity, Urine: 1.025 (ref 1.000–1.030)
Urine Glucose: 100 — AB
Urobilinogen, UA: 0.2 (ref 0.0–1.0)
pH: 6 (ref 5.0–8.0)

## 2023-12-11 LAB — MAGNESIUM: Magnesium: 2.1 mg/dL (ref 1.5–2.5)

## 2023-12-11 LAB — MICROALBUMIN / CREATININE URINE RATIO
Creatinine,U: 435.9 mg/dL
Microalb Creat Ratio: 15.7 mg/g (ref 0.0–30.0)
Microalb, Ur: 6.8 mg/dL — ABNORMAL HIGH (ref 0.0–1.9)

## 2023-12-11 LAB — LIPID PANEL
Cholesterol: 243 mg/dL — ABNORMAL HIGH (ref 0–200)
HDL: 38.5 mg/dL — ABNORMAL LOW (ref >39.00)
LDL Cholesterol: 157 mg/dL — ABNORMAL HIGH (ref 0–99)
NonHDL: 204.82
Total CHOL/HDL Ratio: 6
Triglycerides: 241 mg/dL — ABNORMAL HIGH (ref 0.0–149.0)
VLDL: 48.2 mg/dL — ABNORMAL HIGH (ref 0.0–40.0)

## 2023-12-11 MED ORDER — TIRZEPATIDE 2.5 MG/0.5ML ~~LOC~~ SOAJ
2.5000 mg | SUBCUTANEOUS | 0 refills | Status: DC
Start: 2023-12-11 — End: 2024-01-08

## 2023-12-11 NOTE — Telephone Encounter (Signed)
 Pharmacy Patient Advocate Encounter   Received notification from CoverMyMeds that prior authorization for  Mounjaro 2.5MG /0.5ML auto-injectors is required/requested.   Insurance verification completed.   The patient is insured through [facility] .   Per test claim: PA required; PA submitted to above mentioned insurance via CoverMyMeds Key/confirmation #/EOC [id number] Status is pending

## 2023-12-11 NOTE — Progress Notes (Unsigned)
 Subjective:  Patient ID: Cory Little, male    DOB: 1985-08-21  Age: 38 y.o. MRN: [id number]  CC: Hypertension (4 week follow up ), Hyperlipidemia, and Diabetes   HPI Cory Little presents for f/up ----  Discussed the use of AI scribe software for clinical note transcription with the patient, who gave verbal consent to proceed.  History of Present Illness   Cory Little is a 38 year old male with type 2 diabetes who presents for medication management and follow-up.  He is seeking a prescription for Zepbound  for weight loss due to insurance denial of Wegovy . He plans to appeal the insurance decision if denied again.  He has type 2 diabetes with an A1c of 6.5 as of April. No excessive thirst or drastic changes in weight or appetite, but increased urination since starting diuretics. He is currently taking metformin  750 mg, which he feels significantly improves his well-being. He wants to continue metformin  indefinitely.  No symptoms related to high blood pressure such as headaches, blurred vision, chest pain, or shortness of breath. He has not had an eye exam since childhood and has never needed glasses.  His social history includes occasional alcohol consumption, primarily socially, with a preference for beer and Vodka drinks, though he notes a decreased interest in alcohol as he has aged. He mentions dietary habits including eating eggs for breakfast.  He is willing to receive a pneumonia vaccine. He has not received the latest COVID vaccine and expresses uncertainty about its necessity, noting a lack of recent media coverage on COVID.       Outpatient Medications Prior to Visit  Medication Sig Dispense Refill   busPIRone  (BUSPAR ) 10 MG tablet Take 1 tablet (10 mg total) by mouth 3 (three) times daily. 270 tablet 0   metFORMIN  (GLUCOPHAGE -XR) 750 MG 24 hr tablet Take 1 tablet (750 mg total) by mouth daily with breakfast. 30 tablet 0   potassium chloride  (KLOR-CON  10) 10 MEQ  tablet Take 1 tablet (10 mEq total) by mouth 2 (two) times daily. 180 tablet 0   tadalafil  (CIALIS ) 20 MG tablet Take 1 tablet (20 mg total) by mouth every other day as needed for erectile dysfunction. 10 tablet 3   triamterene -hydrochlorothiazide  (DYAZIDE) 37.5-25 MG capsule Take 1 each (1 capsule total) by mouth daily. 30 capsule 0   telmisartan  (MICARDIS ) 40 MG tablet Take 1 tablet (40 mg total) by mouth daily. 90 tablet 0   tirzepatide  (ZEPBOUND ) 2.5 MG/0.5ML Pen Inject 2.5 mg into the skin once a week. 4 mL 0   No facility-administered medications prior to visit.    ROS Review of Systems  Constitutional:  Positive for unexpected weight change (wt gain). Negative for appetite change, chills, diaphoresis and fatigue.  HENT: Negative.    Eyes: Negative.   Respiratory:  Negative for cough, chest tightness, shortness of breath and wheezing.   Cardiovascular:  Negative for chest pain, palpitations and leg swelling.  Gastrointestinal:  Negative for abdominal pain, constipation, diarrhea, nausea and vomiting.  Endocrine: Negative.   Genitourinary: Negative.  Negative for difficulty urinating.  Musculoskeletal: Negative.  Negative for arthralgias and myalgias.  Skin: Negative.   Neurological:  Negative for dizziness and weakness.  Hematological:  Negative for adenopathy. Does not bruise/bleed easily.  Psychiatric/Behavioral: Negative.  Negative for decreased concentration, dysphoric mood, hallucinations and sleep disturbance.     Objective:  BP (!) 146/94 (BP Location: Left Arm, Patient Position: Sitting, Cuff Size: Large)   Pulse 87  Temp 98.2 F (36.8 C) (Oral)   Resp 16   Ht 5\' 10"  (1.778 m)   Wt 288 lb (130.6 kg)   SpO2 97%   BMI 41.32 kg/m   BP Readings from Last 3 Encounters:  12/11/23 (!) 146/94  11/06/23 (!) 158/116  07/11/23 (!) 158/102    Wt Readings from Last 3 Encounters:  12/11/23 288 lb (130.6 kg)  11/06/23 285 lb 9.6 oz (129.5 kg)  03/03/23 276 lb (125.2 kg)     Physical Exam Vitals reviewed.  Constitutional:      Appearance: He is obese.  HENT:     Nose: Nose normal.     Mouth/Throat:     Mouth: Mucous membranes are moist.  Eyes:     General: No scleral icterus.    Conjunctiva/sclera: Conjunctivae normal.  Cardiovascular:     Rate and Rhythm: Normal rate and regular rhythm.     Pulses: Normal pulses.     Heart sounds: No murmur heard.    No friction rub. No gallop.  Pulmonary:     Effort: Pulmonary effort is normal.     Breath sounds: No stridor. No wheezing, rhonchi or rales.  Abdominal:     General: Abdomen is protuberant. Bowel sounds are normal. There is no distension.     Palpations: Abdomen is soft. There is no hepatomegaly, splenomegaly or mass.     Tenderness: There is no abdominal tenderness. There is no guarding.  Musculoskeletal:        General: Normal range of motion.     Cervical back: Neck supple.     Right lower leg: No edema.     Left lower leg: No edema.  Lymphadenopathy:     Cervical: No cervical adenopathy.  Skin:    General: Skin is warm and dry.  Neurological:     General: No focal deficit present.     Mental Status: He is alert. Mental status is at baseline.  Psychiatric:        Mood and Affect: Mood normal.        Behavior: Behavior normal.        Thought Content: Thought content normal.     Lab Results  Component Value Date   WBC 7.8 11/06/2023   HGB 15.4 11/06/2023   HCT 43.9 11/06/2023   PLT 270.0 11/06/2023   GLUCOSE 174 (H) 12/11/2023   CHOL 243 (H) 12/11/2023   TRIG 241.0 (H) 12/11/2023   HDL 38.50 (L) 12/11/2023   LDLDIRECT 159.0 12/30/2022   LDLCALC 157 (H) 12/11/2023   ALT 40 11/06/2023   AST 24 11/06/2023   NA 138 12/11/2023   K 3.3 (L) 12/11/2023   CL 103 12/11/2023   CREATININE 1.31 12/11/2023   BUN 16 12/11/2023   CO2 28 12/11/2023   TSH 1.26 11/06/2023   HGBA1C 6.5 11/06/2023   MICROALBUR 6.8 (H) 12/11/2023    US  Renal Result Date: 11/18/2023 CLINICAL DATA:   Hypertension EXAM: RENAL / URINARY TRACT ULTRASOUND COMPLETE COMPARISON:  Renal stone CT Dec 17, 2018 FINDINGS: Right Kidney: Renal measurements: 10.0 x 6.4 x 6.7 cm = volume: 219 mL. Echogenicity within normal limits. No mass or hydronephrosis visualized. Left Kidney: Renal measurements: 12.8 x 6.3 x 6.0 cm = volume: 250 mL. Echogenicity within normal limits. No mass or hydronephrosis visualized. Bladder: Appears normal for degree of bladder distention. Other: None. IMPRESSION: No hydronephrosis. Electronically Signed   By: Jone Neither M.D.   On: 11/18/2023 17:26    Assessment &  Plan:   Hypertensive left ventricular hypertrophy, without heart failure -     DRUG MONITORING, PANEL 7 WITH CONFIRMATION, URINE; Future -     Basic metabolic panel with GFR; Future -     Urinalysis, Routine w reflex microscopic; Future -     Telmisartan ; Take 1 tablet (40 mg total) by mouth daily.  Dispense: 90 tablet; Refill: 0  Marijuana abuse -     DRUG MONITORING, PANEL 7 WITH CONFIRMATION, URINE; Future  Malignant hypertension- BP has improved. He will continue working on his lifestyle modifications. -     DRUG MONITORING, PANEL 7 WITH CONFIRMATION, URINE; Future -     Basic metabolic panel with GFR; Future -     Urinalysis, Routine w reflex microscopic; Future -     Drug Screen, Comprehensive, with Confirmation, Urine -     Telmisartan ; Take 1 tablet (40 mg total) by mouth daily.  Dispense: 90 tablet; Refill: 0  Type 2 diabetes mellitus with other specified complication, without long-term current use of insulin  (HCC) -     Microalbumin / creatinine urine ratio; Future -     Urinalysis, Routine w reflex microscopic; Future -     Tirzepatide ; Inject 2.5 mg into the skin once a week.  Dispense: 2 mL; Refill: 0 -     Ambulatory referral to Ophthalmology -     HM Diabetes Foot Exam -     Telmisartan ; Take 1 tablet (40 mg total) by mouth daily.  Dispense: 90 tablet; Refill: 0  Diuretic-induced hypokalemia -      Basic metabolic panel with GFR; Future -     Magnesium; Future  Pure hyperglyceridemia -     Lipid panel; Future  Immunization due -     Pneumococcal conjugate vaccine 20-valent     Follow-up: No follow-ups on file.  Sandra Crouch, MD

## 2023-12-12 ENCOUNTER — Other Ambulatory Visit: Payer: Self-pay | Admitting: Internal Medicine

## 2023-12-12 DIAGNOSIS — E1165 Type 2 diabetes mellitus with hyperglycemia: Secondary | ICD-10-CM

## 2023-12-12 DIAGNOSIS — Z23 Encounter for immunization: Secondary | ICD-10-CM | POA: Insufficient documentation

## 2023-12-12 MED ORDER — TELMISARTAN 40 MG PO TABS: 40.0000 mg | ORAL_TABLET | Freq: Every day | ORAL | 0 refills | Status: DC

## 2023-12-13 LAB — DRUG MONITORING, PANEL 7 WITH CONFIRMATION, URINE
6 Acetylmorphine: NEGATIVE ng/mL (ref <10)
Alcohol Metabolites: NEGATIVE ng/mL (ref <500)
Amphetamines: NEGATIVE ng/mL (ref <500)
Barbiturates: NEGATIVE ng/mL (ref <300)
Benzodiazepines: NEGATIVE ng/mL (ref <100)
Cocaine Metabolite: NEGATIVE ng/mL (ref <150)
Creatinine: >300 mg/dL (ref >=20.0)
Marijuana Metabolite: 457 ng/mL — ABNORMAL HIGH (ref <5)
Marijuana Metabolite: POSITIVE ng/mL — AB (ref <20)
Methadone Metabolite: NEGATIVE ng/mL (ref <100)
Opiates: NEGATIVE ng/mL (ref <100)
Oxidant: NEGATIVE ug/mL (ref <200)
Oxycodone: NEGATIVE ng/mL (ref <100)
pH: 5.6 (ref 4.5–9.0)

## 2023-12-13 LAB — DM TEMPLATE

## 2023-12-15 NOTE — Telephone Encounter (Signed)
 Pharmacy Patient Advocate Encounter  Received notification from [employer] that Prior Authorization for  Mounjaro 2.5MG /0.5ML auto-injectors has been APPROVED from 12/11/2023 to 12/10/2024   PA #/Case ID/Reference #: [id number]

## 2024-01-06 ENCOUNTER — Other Ambulatory Visit: Payer: Self-pay | Admitting: Internal Medicine

## 2024-01-06 DIAGNOSIS — I10 Essential (primary) hypertension: Secondary | ICD-10-CM

## 2024-01-07 ENCOUNTER — Other Ambulatory Visit: Payer: Self-pay | Admitting: Internal Medicine

## 2024-01-08 ENCOUNTER — Encounter: Payer: Self-pay | Admitting: Internal Medicine

## 2024-01-08 ENCOUNTER — Ambulatory Visit: Admitting: Internal Medicine

## 2024-01-08 VITALS — BP 144/98 | HR 88 | Temp 98.6°F | Resp 16 | Ht 70.0 in | Wt 283.8 lb

## 2024-01-08 DIAGNOSIS — N522 Drug-induced erectile dysfunction: Secondary | ICD-10-CM

## 2024-01-08 DIAGNOSIS — Z0000 Encounter for general adult medical examination without abnormal findings: Secondary | ICD-10-CM

## 2024-01-08 DIAGNOSIS — T502X5A Adverse effect of carbonic-anhydrase inhibitors, benzothiadiazides and other diuretics, initial encounter: Secondary | ICD-10-CM

## 2024-01-08 DIAGNOSIS — Z7985 Long-term (current) use of injectable non-insulin antidiabetic drugs: Secondary | ICD-10-CM

## 2024-01-08 DIAGNOSIS — I10 Essential (primary) hypertension: Secondary | ICD-10-CM

## 2024-01-08 DIAGNOSIS — E1165 Type 2 diabetes mellitus with hyperglycemia: Secondary | ICD-10-CM

## 2024-01-08 DIAGNOSIS — Z0001 Encounter for general adult medical examination with abnormal findings: Secondary | ICD-10-CM

## 2024-01-08 DIAGNOSIS — E876 Hypokalemia: Secondary | ICD-10-CM

## 2024-01-08 LAB — BASIC METABOLIC PANEL WITH GFR
BUN/Creatinine Ratio: 10 (calc) (ref 6–22)
BUN: 15 mg/dL (ref 7–25)
CO2: 22 mmol/L (ref 20–32)
Calcium: 9.4 mg/dL (ref 8.6–10.3)
Chloride: 105 mmol/L (ref 98–110)
Creat: 1.47 mg/dL — ABNORMAL HIGH (ref 0.60–1.26)
Glucose, Bld: 125 mg/dL — ABNORMAL HIGH (ref 65–99)
Potassium: 3.5 mmol/L (ref 3.5–5.3)
Sodium: 140 mmol/L (ref 135–146)
eGFR: 62 mL/min/{1.73_m2} (ref >=60)

## 2024-01-08 MED ORDER — TADALAFIL 20 MG PO TABS
20.0000 mg | ORAL_TABLET | ORAL | 3 refills | Status: DC | PRN
Start: 2024-01-08 — End: 2024-04-16

## 2024-01-08 MED ORDER — METFORMIN HCL ER 750 MG PO TB24: 750.0000 mg | ORAL_TABLET | Freq: Every day | ORAL | 0 refills | Status: DC

## 2024-01-08 MED ORDER — MOUNJARO 5 MG/0.5ML ~~LOC~~ SOAJ: 5.0000 mg | SUBCUTANEOUS | 0 refills | Status: DC

## 2024-01-08 NOTE — Progress Notes (Signed)
 Subjective:  Patient ID: Cory Little, male    DOB: 1985-11-13  Age: 38 y.o. MRN: [id number]  CC: Hypertension   HPI Cory Little presents for f/up ----  Discussed the use of AI scribe software for clinical note transcription with the patient, who gave verbal consent to proceed.  History of Present Illness   Cory Little is a 38 year old male with diabetes and hypertension who presents for a follow-up visit.  He has experienced a weight loss of five pounds, which he attributes to his current medication regimen, including Mounjaro . He notes a decreased appetite and a desire to eat healthier, although he sometimes struggles to find healthy options during lunch due to his job as a [employer]er. He typically eats vegetables in the evening and eggs with strawberries in the morning, occasionally including sausage.  No side effects from his medications, including abdominal pain, nausea, vomiting, or belching. He has not had an eye exam in a long time but has a referral for one in September. He has not had a pneumonia vaccine recently.  He mentions that he has felt better overall since starting Mounjaro . He also inquires about the possibility of resuming Cialis , which was last prescribed a couple of years ago.       Outpatient Medications Prior to Visit  Medication Sig Dispense Refill   busPIRone  (BUSPAR ) 10 MG tablet Take 1 tablet (10 mg total) by mouth 3 (three) times daily. 270 tablet 0   potassium chloride  (KLOR-CON  10) 10 MEQ tablet Take 1 tablet (10 mEq total) by mouth 2 (two) times daily. 180 tablet 0   telmisartan  (MICARDIS ) 40 MG tablet Take 1 tablet (40 mg total) by mouth daily. 90 tablet 0   metFORMIN  (GLUCOPHAGE -XR) 750 MG 24 hr tablet TAKE ONE TABLET BY MOUTH ONCE A DAY WITH BREAKFAST 90 tablet 0   tadalafil  (CIALIS ) 20 MG tablet Take 1 tablet (20 mg total) by mouth every other day as needed for erectile dysfunction. 10 tablet 3   tirzepatide  (MOUNJARO ) 2.5 MG/0.5ML  Pen Inject 2.5 mg into the skin once a week. 2 mL 0   triamterene -hydrochlorothiazide  (DYAZIDE) 37.5-25 MG capsule TAKE ONE CAPSULE BY MOUTH ONCE A DAY 30 capsule 0   No facility-administered medications prior to visit.    ROS Review of Systems  Constitutional:  Negative for appetite change, chills, diaphoresis, fatigue and fever.  HENT: Negative.    Eyes: Negative.   Respiratory: Negative.  Negative for cough, chest tightness, shortness of breath and wheezing.   Cardiovascular:  Negative for chest pain, palpitations and leg swelling.  Gastrointestinal: Negative.  Negative for abdominal pain, constipation, diarrhea, nausea and vomiting.  Genitourinary: Negative.  Negative for difficulty urinating, scrotal swelling and testicular pain.       +ED  Musculoskeletal: Negative.  Negative for arthralgias and myalgias.  Skin: Negative.   Neurological:  Negative for dizziness, weakness and headaches.  Hematological:  Negative for adenopathy. Does not bruise/bleed easily.  Psychiatric/Behavioral:  Positive for decreased concentration and dysphoric mood. Negative for confusion, self-injury and suicidal ideas. The patient is nervous/anxious.     Objective:  BP (!) 144/98 (BP Location: Left Arm, Patient Position: Sitting, Cuff Size: Large)   Pulse 88   Temp 98.6 F (37 C) (Oral)   Resp 16   Ht 5' 10 (1.778 m)   Wt 283 lb 12.8 oz (128.7 kg)   SpO2 99%   BMI 40.72 kg/m   BP Readings from Last 3 Encounters:  01/08/24 (!) 144/98  12/11/23 (!) 146/94  11/06/23 (!) 158/116    Wt Readings from Last 3 Encounters:  01/08/24 283 lb 12.8 oz (128.7 kg)  12/11/23 288 lb (130.6 kg)  11/06/23 285 lb 9.6 oz (129.5 kg)    Physical Exam Vitals reviewed.  Constitutional:      Appearance: He is obese.  HENT:     Nose: Nose normal.     Mouth/Throat:     Mouth: Mucous membranes are moist.   Eyes:     General: No scleral icterus.    Conjunctiva/sclera: Conjunctivae normal.     Cardiovascular:     Rate and Rhythm: Normal rate and regular rhythm.     Heart sounds: No murmur heard.    No friction rub. No gallop.  Pulmonary:     Effort: Pulmonary effort is normal.     Breath sounds: No stridor. No wheezing, rhonchi or rales.  Abdominal:     General: Abdomen is protuberant.     Palpations: There is no hepatomegaly, splenomegaly or mass.     Tenderness: There is no abdominal tenderness. There is no guarding or rebound.   Musculoskeletal:        General: Normal range of motion.     Cervical back: Neck supple.     Right lower leg: No edema.     Left lower leg: No edema.  Lymphadenopathy:     Cervical: No cervical adenopathy.   Skin:    General: Skin is warm and dry.   Neurological:     General: No focal deficit present.     Mental Status: He is alert.   Psychiatric:        Mood and Affect: Mood normal.        Behavior: Behavior normal.     Lab Results  Component Value Date   WBC 7.8 11/06/2023   HGB 15.4 11/06/2023   HCT 43.9 11/06/2023   PLT 270.0 11/06/2023   GLUCOSE 125 (H) 01/08/2024   CHOL 243 (H) 12/11/2023   TRIG 241.0 (H) 12/11/2023   HDL 38.50 (L) 12/11/2023   LDLDIRECT 159.0 12/30/2022   LDLCALC 157 (H) 12/11/2023   ALT 40 11/06/2023   AST 24 11/06/2023   NA 140 01/08/2024   K 3.5 01/08/2024   CL 105 01/08/2024   CREATININE 1.47 (H) 01/08/2024   BUN 15 01/08/2024   CO2 22 01/08/2024   TSH 1.26 11/06/2023   HGBA1C 6.5 11/06/2023   MICROALBUR 6.8 (H) 12/11/2023    US  Renal Result Date: 11/18/2023 CLINICAL DATA:  Hypertension EXAM: RENAL / URINARY TRACT ULTRASOUND COMPLETE COMPARISON:  Renal stone CT Dec 17, 2018 FINDINGS: Right Kidney: Renal measurements: 10.0 x 6.4 x 6.7 cm = volume: 219 mL. Echogenicity within normal limits. No mass or hydronephrosis visualized. Left Kidney: Renal measurements: 12.8 x 6.3 x 6.0 cm = volume: 250 mL. Echogenicity within normal limits. No mass or hydronephrosis visualized. Bladder: Appears  normal for degree of bladder distention. Other: None. IMPRESSION: No hydronephrosis. Electronically Signed   By: Jone Neither M.D.   On: 11/18/2023 17:26    Assessment & Plan:  Malignant hypertension- He has not achieved his BP goal. He is working on his lifestyle modifications. -     Basic metabolic panel with GFR; Future -     Triamterene -HCTZ; Take 1 each (1 capsule total) by mouth daily.  Dispense: 90 capsule; Refill: 0  Type 2 diabetes mellitus with hyperglycemia, without long-term current use of insulin  (HCC)- Blood sugar  is well controlled. -     metFORMIN  HCl ER; Take 1 tablet (750 mg total) by mouth daily with breakfast.  Dispense: 90 tablet; Refill: 0 -     HM Diabetes Foot Exam -     Mounjaro ; Inject 5 mg into the skin once a week.  Dispense: 2 mL; Refill: 0  Diuretic-induced hypokalemia -     Basic metabolic panel with GFR; Future  Drug-induced erectile dysfunction -     Tadalafil ; Take 1 tablet (20 mg total) by mouth every other day as needed for erectile dysfunction.  Dispense: 10 tablet; Refill: 3  Encounter for general adult medical examination with abnormal findings- Exam completed, labs reviewed, vaccines reviewed, no cancer screenings indicated, pt ed material was given.      Follow-up: Return in about 3 months (around 04/09/2024).  Sandra Crouch, MD

## 2024-01-08 NOTE — Patient Instructions (Signed)
 Hypertension, Adult High blood pressure (hypertension) is when the force of blood pumping through the arteries is too strong. The arteries are the blood vessels that carry blood from the heart throughout the body. Hypertension forces the heart to work harder to pump blood and may cause arteries to become narrow or stiff. Untreated or uncontrolled hypertension can lead to a heart attack, heart failure, a stroke, kidney disease, and other problems. A blood pressure reading consists of a higher number over a lower number. Ideally, your blood pressure should be below 120/80. The first ("top") number is called the systolic pressure. It is a measure of the pressure in your arteries as your heart beats. The second ("bottom") number is called the diastolic pressure. It is a measure of the pressure in your arteries as the heart relaxes. What are the causes? The exact cause of this condition is not known. There are some conditions that result in high blood pressure. What increases the risk? Certain factors may make you more likely to develop high blood pressure. Some of these risk factors are under your control, including: Smoking. Not getting enough exercise or physical activity. Being overweight. Having too much fat, sugar, calories, or salt (sodium) in your diet. Drinking too much alcohol. Other risk factors include: Having a personal history of heart disease, diabetes, high cholesterol, or kidney disease. Stress. Having a family history of high blood pressure and high cholesterol. Having obstructive sleep apnea. Age. The risk increases with age. What are the signs or symptoms? High blood pressure may not cause symptoms. Very high blood pressure (hypertensive crisis) may cause: Headache. Fast or irregular heartbeats (palpitations). Shortness of breath. Nosebleed. Nausea and vomiting. Vision changes. Severe chest pain, dizziness, and seizures. How is this diagnosed? This condition is diagnosed by  measuring your blood pressure while you are seated, with your arm resting on a flat surface, your legs uncrossed, and your feet flat on the floor. The cuff of the blood pressure monitor will be placed directly against the skin of your upper arm at the level of your heart. Blood pressure should be measured at least twice using the same arm. Certain conditions can cause a difference in blood pressure between your right and left arms. If you have a high blood pressure reading during one visit or you have normal blood pressure with other risk factors, you may be asked to: Return on a different day to have your blood pressure checked again. Monitor your blood pressure at home for 1 week or longer. If you are diagnosed with hypertension, you may have other blood or imaging tests to help your health care provider understand your overall risk for other conditions. How is this treated? This condition is treated by making healthy lifestyle changes, such as eating healthy foods, exercising more, and reducing your alcohol intake. You may be referred for counseling on a healthy diet and physical activity. Your health care provider may prescribe medicine if lifestyle changes are not enough to get your blood pressure under control and if: Your systolic blood pressure is above 130. Your diastolic blood pressure is above 80. Your personal target blood pressure may vary depending on your medical conditions, your age, and other factors. Follow these instructions at home: Eating and drinking  Eat a diet that is high in fiber and potassium, and low in sodium, added sugar, and fat. An example of this eating plan is called the DASH diet. DASH stands for Dietary Approaches to Stop Hypertension. To eat this way: Eat  plenty of fresh fruits and vegetables. Try to fill one half of your plate at each meal with fruits and vegetables. Eat whole grains, such as whole-wheat pasta, brown rice, or whole-grain bread. Fill about one  fourth of your plate with whole grains. Eat or drink low-fat dairy products, such as skim milk or low-fat yogurt. Avoid fatty cuts of meat, processed or cured meats, and poultry with skin. Fill about one fourth of your plate with lean proteins, such as fish, chicken without skin, beans, eggs, or tofu. Avoid pre-made and processed foods. These tend to be higher in sodium, added sugar, and fat. Reduce your daily sodium intake. Many people with hypertension should eat less than 1,500 mg of sodium a day. Do not drink alcohol if: Your health care provider tells you not to drink. You are pregnant, may be pregnant, or are planning to become pregnant. If you drink alcohol: Limit how much you have to: 0-1 drink a day for women. 0-2 drinks a day for men. Know how much alcohol is in your drink. In the [place] one drink equals one 12 oz bottle of beer (355 mL), one 5 oz glass of wine (148 mL), or one 1 oz glass of hard liquor (44 mL). Lifestyle  Work with your health care provider to maintain a healthy body weight or to lose weight. Ask what an ideal weight is for you. Get at least 30 minutes of exercise that causes your heart to beat faster (aerobic exercise) most days of the week. Activities may include walking, swimming, or biking. Include exercise to strengthen your muscles (resistance exercise), such as Pilates or lifting weights, as part of your weekly exercise routine. Try to do these types of exercises for 30 minutes at least 3 days a week. Do not use any products that contain nicotine or tobacco. These products include cigarettes, chewing tobacco, and vaping devices, such as e-cigarettes. If you need help quitting, ask your health care provider. Monitor your blood pressure at home as told by your health care provider. Keep all follow-up visits. This is important. Medicines Take over-the-counter and prescription medicines only as told by your health care provider. Follow directions carefully. Blood  pressure medicines must be taken as prescribed. Do not skip doses of blood pressure medicine. Doing this puts you at risk for problems and can make the medicine less effective. Ask your health care provider about side effects or reactions to medicines that you should watch for. Contact a health care provider if you: Think you are having a reaction to a medicine you are taking. Have headaches that keep coming back (recurring). Feel dizzy. Have swelling in your ankles. Have trouble with your vision. Get help right away if you: Develop a severe headache or confusion. Have unusual weakness or numbness. Feel faint. Have severe pain in your chest or abdomen. Vomit repeatedly. Have trouble breathing. These symptoms may be an emergency. Get help right away. Call 911. Do not wait to see if the symptoms will go away. Do not drive yourself to the hospital. Summary Hypertension is when the force of blood pumping through your arteries is too strong. If this condition is not controlled, it may put you at risk for serious complications. Your personal target blood pressure may vary depending on your medical conditions, your age, and other factors. For most people, a normal blood pressure is less than 120/80. Hypertension is treated with lifestyle changes, medicines, or a combination of both. Lifestyle changes include losing weight, eating a healthy,  low-sodium diet, exercising more, and limiting alcohol. This information is not intended to replace advice given to you by your health care provider. Make sure you discuss any questions you have with your health care provider. Document Revised: 05/22/2021 Document Reviewed: 05/22/2021 Elsevier Patient Education  2024 [employer].

## 2024-01-09 MED ORDER — TRIAMTERENE-HCTZ 37.5-25 MG PO CAPS
1.0000 | ORAL_CAPSULE | Freq: Every day | ORAL | 0 refills | Status: DC
Start: 2024-01-09 — End: 2024-04-19

## 2024-02-03 ENCOUNTER — Other Ambulatory Visit: Payer: Self-pay | Admitting: Internal Medicine

## 2024-02-03 DIAGNOSIS — F411 Generalized anxiety disorder: Secondary | ICD-10-CM

## 2024-02-04 ENCOUNTER — Other Ambulatory Visit: Payer: Self-pay | Admitting: Internal Medicine

## 2024-02-04 DIAGNOSIS — E1165 Type 2 diabetes mellitus with hyperglycemia: Secondary | ICD-10-CM

## 2024-02-04 MED ORDER — MOUNJARO 5 MG/0.5ML ~~LOC~~ SOAJ: 5.0000 mg | SUBCUTANEOUS | 0 refills | Status: DC

## 2024-02-04 NOTE — Telephone Encounter (Signed)
 Copied from CRM [id number]. Topic: Clinical - Medication Refill >> Feb 04, 2024  8:17 AM Macario HERO wrote: Medication: tirzepatide  (MOUNJARO ) 5 MG/0.5ML Pen [[id number]]  Has the patient contacted their pharmacy? No (Agent: If no, request that the patient contact the pharmacy for the refill. If patient does not wish to contact the pharmacy document the reason why and proceed with request.) (Agent: If yes, when and what did the pharmacy advise?)  This is the patient's preferred pharmacy:  [facility] [id number], [place] - [address] [place] [address] Phone: [phone] Fax: [phone]   Is this the correct pharmacy for this prescription? Yes If no, delete pharmacy and type the correct one.   Has the prescription been filled recently? Yes  Is the patient out of the medication? Yes  Has the patient been seen for an appointment in the last year OR does the patient have an upcoming appointment? Yes  Can we respond through MyChart? Yes  Agent: Please be advised that Rx refills may take up to 3 business days. We ask that you follow-up with your pharmacy.

## 2024-02-12 ENCOUNTER — Encounter: Payer: Self-pay | Admitting: Internal Medicine

## 2024-02-12 ENCOUNTER — Ambulatory Visit: Admitting: Internal Medicine

## 2024-02-12 VITALS — BP 148/102 | HR 72 | Temp 98.5°F | Resp 16 | Ht 70.0 in | Wt 278.4 lb

## 2024-02-12 DIAGNOSIS — I1A0 Resistant hypertension: Secondary | ICD-10-CM | POA: Diagnosis not present

## 2024-02-12 DIAGNOSIS — G4733 Obstructive sleep apnea (adult) (pediatric): Secondary | ICD-10-CM

## 2024-02-12 DIAGNOSIS — E119 Type 2 diabetes mellitus without complications: Secondary | ICD-10-CM

## 2024-02-12 MED ORDER — TIRZEPATIDE-WEIGHT MANAGEMENT 7.5 MG/0.5ML ~~LOC~~ SOLN: 7.5000 mg | SUBCUTANEOUS | 0 refills | Status: DC

## 2024-02-12 NOTE — Progress Notes (Unsigned)
 Subjective:  Patient ID: Cory Little, male    DOB: 30-May-1986  Age: 38 y.o. MRN: [id number]  CC: Medical Management of Chronic Issues (Mounjaro . Patient states that he wasn't able to get his medication due to his co payment being so high. ), Hypertension, and Diabetes   HPI Cory Little presents for f/up -----  Discussed the use of AI scribe software for clinical note transcription with the patient, who gave verbal consent to proceed.  History of Present Illness   Cory Little is a 38 year old male with hypertension and diabetes who presents for medication management and follow-up.  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 He works full-time, approximately seven hours a day in [employer], and attributes increased physical activity to his recent weight loss. He recalls an incident during a heat wave two to three weeks ago when he felt like he was going to fall and had to stop working to cool down. No headaches, blurred vision, chest pain, shortness of breath, nausea, vomiting, abdominal pain, or diarrhea.  He is currently taking diuretics, Mounjaro , metformin , and telmisartan . The copay for Mounjaro  has increased significantly, affecting his ability to afford it. He has missed two weeks of Mounjaro  due to cost but plans to budget for it. He wants to increase the dose if affordable. Metformin  positively affects his mood and he prefers it over antidepressants.  He has lost six pounds since June, with his weight decreasing from 284 to 278 pounds. He is considering getting a gym membership. He is currently working three jobs, which limits his time for exercise.  He is interested in returning to an office setting once his blood pressure is better controlled, as he finds it difficult to focus in such environments due to ADD. He is considering the possibility of resuming Adderall if his  blood pressure improves. 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       Outpatient Medications Prior to Visit  Medication Sig Dispense Refill   busPIRone  (BUSPAR ) 10 MG tablet Take 1 tablet (10 mg total) by mouth 3 times daily. 270 tablet 0   metFORMIN  (GLUCOPHAGE -XR) 750 MG 24 hr tablet Take 1 tablet (750 mg total) by mouth daily with breakfast. 90 tablet 0   potassium chloride  (KLOR-CON  10) 10 MEQ tablet Take 1 tablet (10 mEq total) by mouth 2 (two) times daily. 180 tablet 0   tadalafil  (CIALIS ) 20 MG tablet Take 1 tablet (20 mg total) by mouth every other day as needed for erectile dysfunction. 10 tablet 3   telmisartan  (MICARDIS ) 40 MG tablet Take 1 tablet (40 mg total) by mouth daily. 90 tablet 0   triamterene -hydrochlorothiazide  (DYAZIDE) 37.5-25 MG capsule Take 1 each (1 capsule total) by mouth daily. 90 capsule 0   tirzepatide  (MOUNJARO ) 5 MG/0.5ML Pen Inject 5 mg into the skin once a week. 2 mL 0   No facility-administered medications prior to visit.    ROS Review of Systems  Constitutional:  Negative for appetite change, chills, diaphoresis, fatigue and fever.  HENT: Negative.    Eyes: Negative.   Respiratory:  Positive for apnea. Negative for cough, chest tightness, shortness of breath and wheezing.   Cardiovascular:  Negative for chest pain, palpitations and leg swelling.  Gastrointestinal:  Negative for abdominal pain, constipation, diarrhea, nausea and vomiting.  Endocrine:  Negative.   Genitourinary:  Negative for difficulty urinating.  Musculoskeletal: Negative.  Negative for myalgias.  Skin: Negative.   Neurological: Negative.  Negative for dizziness, weakness and headaches.  Hematological:  Negative for adenopathy. Does not bruise/bleed easily.  Psychiatric/Behavioral:  Positive for decreased concentration. Negative for behavioral problems, confusion, dysphoric mood and sleep disturbance. The patient is not  nervous/anxious.     Objective:  BP (!) 148/102 (BP Location: Left Arm, Patient Position: Sitting, Cuff Size: Large)   Pulse 72   Temp 98.5 F (36.9 C) (Oral)   Resp 16   Ht 5' 10 (1.778 m)   Wt 278 lb 6.4 oz (126.3 kg)   SpO2 97%   BMI 39.95 kg/m   BP Readings from Last 3 Encounters:  02/12/24 (!) 148/102  01/08/24 (!) 144/98  12/11/23 (!) 146/94    Wt Readings from Last 3 Encounters:  02/12/24 278 lb 6.4 oz (126.3 kg)  01/08/24 283 lb 12.8 oz (128.7 kg)  12/11/23 288 lb (130.6 kg)    Physical Exam Vitals reviewed.  Constitutional:      Appearance: Normal appearance.  HENT:     Mouth/Throat:     Mouth: Mucous membranes are moist.  Eyes:     General: No scleral icterus.    Conjunctiva/sclera: Conjunctivae normal.  Cardiovascular:     Rate and Rhythm: Normal rate and regular rhythm.     Heart sounds: No murmur heard.    No friction rub. No gallop.  Pulmonary:     Effort: Pulmonary effort is normal.     Breath sounds: No stridor. No wheezing, rhonchi or rales.  Abdominal:     General: Abdomen is protuberant. There is no distension.     Palpations: There is no mass.     Tenderness: There is no abdominal tenderness. There is no guarding.     Hernia: No hernia is present.  Musculoskeletal:        General: Normal range of motion.     Cervical back: Neck supple.     Right lower leg: No edema.     Left lower leg: No edema.  Lymphadenopathy:     Cervical: No cervical adenopathy.  Skin:    General: Skin is warm and dry.     Coloration: Skin is not pale.  Neurological:     General: No focal deficit present.     Mental Status: He is alert.  Psychiatric:        Mood and Affect: Mood normal.        Behavior: Behavior normal.     Lab Results  Component Value Date   WBC 7.8 11/06/2023   HGB 15.4 11/06/2023   HCT 43.9 11/06/2023   PLT 270.0 11/06/2023   GLUCOSE 125 (H) 01/08/2024   CHOL 243 (H) 12/11/2023   TRIG 241.0 (H) 12/11/2023   HDL 38.50 (L)  12/11/2023   LDLDIRECT 159.0 12/30/2022   LDLCALC 157 (H) 12/11/2023   ALT 40 11/06/2023   AST 24 11/06/2023   NA 140 01/08/2024   K 3.5 01/08/2024   CL 105 01/08/2024   CREATININE 1.47 (H) 01/08/2024   BUN 15 01/08/2024   CO2 22 01/08/2024   TSH 1.26 11/06/2023   HGBA1C 6.5 11/06/2023   MICROALBUR 6.8 (H) 12/11/2023    US  Renal Result Date: 11/18/2023 CLINICAL DATA:  Hypertension EXAM: RENAL / URINARY TRACT ULTRASOUND COMPLETE COMPARISON:  Renal stone CT Dec 17, 2018 FINDINGS: Right Kidney: Renal measurements: 10.0 x 6.4 x 6.7 cm =  volume: 219 mL. Echogenicity within normal limits. No mass or hydronephrosis visualized. Left Kidney: Renal measurements: 12.8 x 6.3 x 6.0 cm = volume: 250 mL. Echogenicity within normal limits. No mass or hydronephrosis visualized. Bladder: Appears normal for degree of bladder distention. Other: None. IMPRESSION: No hydronephrosis. Electronically Signed   By: Bard Moats M.D.   On: 11/18/2023 17:26    Assessment & Plan:  OSA on CPAP -     Tirzepatide -Weight Management; Inject 7.5 mg into the skin once a week.  Dispense: 2 mL; Refill: 0  Resistant hypertension- He has not achieved his BP goal. Will evaluate for RAS -     US  RENAL ARTERY DUPLEX COMPLETE; Future  Type 2 diabetes mellitus without complication, without long-term current use of insulin  (HCC)- Blood sugar is well controlled.     Follow-up: No follow-ups on file.  Debby Molt, MD

## 2024-02-13 ENCOUNTER — Telehealth: Payer: Self-pay

## 2024-02-13 NOTE — Telephone Encounter (Signed)
 Copied from CRM [id number]. Topic: General - Other >> Feb 13, 2024 11:20 AM Arm[place] J wrote: Reason for CRM: Tully calling from [employer] [facility] wanting to speak to someone who handles authorization for imaging order. She is wanting to see if the patient's US  Renal Artery Stenosis was authorized.   Callback for Kristy: [phone] Ext. [phone]

## 2024-02-18 ENCOUNTER — Ambulatory Visit
Admission: RE | Admit: 2024-02-18 | Discharge: 2024-02-18 | Disposition: A | Discharge status: Home / Self Care | Source: Ambulatory Visit | Attending: Internal Medicine | Admitting: Internal Medicine

## 2024-02-18 DIAGNOSIS — I1A0 Resistant hypertension: Secondary | ICD-10-CM

## 2024-02-24 ENCOUNTER — Ambulatory Visit: Payer: Self-pay | Admitting: Internal Medicine

## 2024-03-02 ENCOUNTER — Other Ambulatory Visit: Payer: Self-pay | Admitting: Internal Medicine

## 2024-03-02 DIAGNOSIS — E1169 Type 2 diabetes mellitus with other specified complication: Secondary | ICD-10-CM

## 2024-03-02 DIAGNOSIS — I119 Hypertensive heart disease without heart failure: Secondary | ICD-10-CM

## 2024-03-02 DIAGNOSIS — I10 Essential (primary) hypertension: Secondary | ICD-10-CM

## 2024-03-03 NOTE — Telephone Encounter (Signed)
 Spoke with patient regarding imaging results, gave verbal understanding

## 2024-03-04 NOTE — Telephone Encounter (Signed)
 Last OV 02/12/24 Next OV 03/25/24  Last refill 12/12/23 Qty #09/0

## 2024-03-25 ENCOUNTER — Ambulatory Visit: Admitting: Internal Medicine

## 2024-04-10 ENCOUNTER — Other Ambulatory Visit: Payer: Self-pay | Admitting: Internal Medicine

## 2024-04-10 DIAGNOSIS — N522 Drug-induced erectile dysfunction: Secondary | ICD-10-CM

## 2024-04-19 ENCOUNTER — Ambulatory Visit (INDEPENDENT_AMBULATORY_CARE_PROVIDER_SITE_OTHER): Admitting: Internal Medicine

## 2024-04-19 ENCOUNTER — Encounter: Payer: Self-pay | Admitting: Internal Medicine

## 2024-04-19 ENCOUNTER — Telehealth: Payer: Self-pay

## 2024-04-19 VITALS — BP 152/106 | HR 79 | Temp 97.8°F | Resp 16 | Ht 70.0 in | Wt 281.4 lb

## 2024-04-19 DIAGNOSIS — I10 Essential (primary) hypertension: Secondary | ICD-10-CM

## 2024-04-19 DIAGNOSIS — E1165 Type 2 diabetes mellitus with hyperglycemia: Secondary | ICD-10-CM | POA: Diagnosis not present

## 2024-04-19 DIAGNOSIS — J028 Acute pharyngitis due to other specified organisms: Secondary | ICD-10-CM | POA: Insufficient documentation

## 2024-04-19 DIAGNOSIS — E119 Type 2 diabetes mellitus without complications: Secondary | ICD-10-CM | POA: Diagnosis not present

## 2024-04-19 DIAGNOSIS — I1A0 Resistant hypertension: Secondary | ICD-10-CM

## 2024-04-19 DIAGNOSIS — Z23 Encounter for immunization: Secondary | ICD-10-CM | POA: Insufficient documentation

## 2024-04-19 DIAGNOSIS — U071 COVID-19: Secondary | ICD-10-CM | POA: Diagnosis not present

## 2024-04-19 LAB — CBC WITH DIFFERENTIAL/PLATELET
Basophils Absolute: 0.1 K/uL (ref 0.0–0.1)
Basophils Relative: 0.8 % (ref 0.0–3.0)
Eosinophils Absolute: 0.2 K/uL (ref 0.0–0.7)
Eosinophils Relative: 2.4 % (ref 0.0–5.0)
HCT: 43.2 % (ref 39.0–52.0)
Hemoglobin: 14.9 g/dL (ref 13.0–17.0)
Lymphocytes Relative: 43.1 % (ref 12.0–46.0)
Lymphs Abs: 3.4 K/uL (ref 0.7–4.0)
MCHC: 34.6 g/dL (ref 30.0–36.0)
MCV: 86.9 fl (ref 78.0–100.0)
Monocytes Absolute: 0.9 K/uL (ref 0.1–1.0)
Monocytes Relative: 11.7 % (ref 3.0–12.0)
Neutro Abs: 3.3 K/uL (ref 1.4–7.7)
Neutrophils Relative %: 42 % — ABNORMAL LOW (ref 43.0–77.0)
Platelets: 251 K/uL (ref 150.0–400.0)
RBC: 4.97 Mil/uL (ref 4.22–5.81)
RDW: 13.6 % (ref 11.5–15.5)
WBC: 8 K/uL (ref 4.0–10.5)

## 2024-04-19 LAB — URINALYSIS, ROUTINE W REFLEX MICROSCOPIC
Bilirubin Urine: NEGATIVE
Hgb urine dipstick: NEGATIVE
Ketones, ur: NEGATIVE
Leukocytes,Ua: NEGATIVE
Nitrite: NEGATIVE
Specific Gravity, Urine: 1.02 (ref 1.000–1.030)
Total Protein, Urine: NEGATIVE
Urine Glucose: >=1000 — AB
Urobilinogen, UA: 1 (ref 0.0–1.0)
pH: 7 (ref 5.0–8.0)

## 2024-04-19 LAB — BASIC METABOLIC PANEL WITH GFR
BUN: 12 mg/dL (ref 6–23)
CO2: 28 meq/L (ref 19–32)
Calcium: 9.8 mg/dL (ref 8.4–10.5)
Chloride: 102 meq/L (ref 96–112)
Creatinine, Ser: 1.17 mg/dL (ref 0.40–1.50)
GFR: 78.19 mL/min (ref >60.00)
Glucose, Bld: 63 mg/dL — ABNORMAL LOW (ref 70–99)
Potassium: 3.2 meq/L — ABNORMAL LOW (ref 3.5–5.1)
Sodium: 140 meq/L (ref 135–145)

## 2024-04-19 LAB — POC COVID19 BINAXNOW: SARS Coronavirus 2 Ag: POSITIVE — AB

## 2024-04-19 LAB — POCT INFLUENZA A/B
Influenza A, POC: NEGATIVE
Influenza B, POC: NEGATIVE

## 2024-04-19 LAB — HEMOGLOBIN A1C: Hgb A1c MFr Bld: 6.3 % (ref 4.6–6.5)

## 2024-04-19 MED ORDER — NIRMATRELVIR/RITONAVIR (PAXLOVID)TABLET: 3.0000 | ORAL_TABLET | Freq: Two times a day (BID) | ORAL | 0 refills | Status: AC

## 2024-04-19 MED ORDER — TRIAMTERENE-HCTZ 37.5-25 MG PO CAPS: 1.0000 | ORAL_CAPSULE | Freq: Every day | ORAL | 0 refills | Status: DC

## 2024-04-19 MED ORDER — METFORMIN HCL ER 750 MG PO TB24: 750.0000 mg | ORAL_TABLET | Freq: Every day | ORAL | 0 refills | Status: DC

## 2024-04-19 NOTE — Patient Instructions (Signed)
 Hypertension, Adult High blood pressure (hypertension) is when the force of blood pumping through the arteries is too strong. The arteries are the blood vessels that carry blood from the heart throughout the body. Hypertension forces the heart to work harder to pump blood and may cause arteries to become narrow or stiff. Untreated or uncontrolled hypertension can lead to a heart attack, heart failure, a stroke, kidney disease, and other problems. A blood pressure reading consists of a higher number over a lower number. Ideally, your blood pressure should be below 120/80. The first ("top") number is called the systolic pressure. It is a measure of the pressure in your arteries as your heart beats. The second ("bottom") number is called the diastolic pressure. It is a measure of the pressure in your arteries as the heart relaxes. What are the causes? The exact cause of this condition is not known. There are some conditions that result in high blood pressure. What increases the risk? Certain factors may make you more likely to develop high blood pressure. Some of these risk factors are under your control, including: Smoking. Not getting enough exercise or physical activity. Being overweight. Having too much fat, sugar, calories, or salt (sodium) in your diet. Drinking too much alcohol. Other risk factors include: Having a personal history of heart disease, diabetes, high cholesterol, or kidney disease. Stress. Having a family history of high blood pressure and high cholesterol. Having obstructive sleep apnea. Age. The risk increases with age. What are the signs or symptoms? High blood pressure may not cause symptoms. Very high blood pressure (hypertensive crisis) may cause: Headache. Fast or irregular heartbeats (palpitations). Shortness of breath. Nosebleed. Nausea and vomiting. Vision changes. Severe chest pain, dizziness, and seizures. How is this diagnosed? This condition is diagnosed by  measuring your blood pressure while you are seated, with your arm resting on a flat surface, your legs uncrossed, and your feet flat on the floor. The cuff of the blood pressure monitor will be placed directly against the skin of your upper arm at the level of your heart. Blood pressure should be measured at least twice using the same arm. Certain conditions can cause a difference in blood pressure between your right and left arms. If you have a high blood pressure reading during one visit or you have normal blood pressure with other risk factors, you may be asked to: Return on a different day to have your blood pressure checked again. Monitor your blood pressure at home for 1 week or longer. If you are diagnosed with hypertension, you may have other blood or imaging tests to help your health care provider understand your overall risk for other conditions. How is this treated? This condition is treated by making healthy lifestyle changes, such as eating healthy foods, exercising more, and reducing your alcohol intake. You may be referred for counseling on a healthy diet and physical activity. Your health care provider may prescribe medicine if lifestyle changes are not enough to get your blood pressure under control and if: Your systolic blood pressure is above 130. Your diastolic blood pressure is above 80. Your personal target blood pressure may vary depending on your medical conditions, your age, and other factors. Follow these instructions at home: Eating and drinking  Eat a diet that is high in fiber and potassium, and low in sodium, added sugar, and fat. An example of this eating plan is called the DASH diet. DASH stands for Dietary Approaches to Stop Hypertension. To eat this way: Eat  plenty of fresh fruits and vegetables. Try to fill one half of your plate at each meal with fruits and vegetables. Eat whole grains, such as whole-wheat pasta, brown rice, or whole-grain bread. Fill about one  fourth of your plate with whole grains. Eat or drink low-fat dairy products, such as skim milk or low-fat yogurt. Avoid fatty cuts of meat, processed or cured meats, and poultry with skin. Fill about one fourth of your plate with lean proteins, such as fish, chicken without skin, beans, eggs, or tofu. Avoid pre-made and processed foods. These tend to be higher in sodium, added sugar, and fat. Reduce your daily sodium intake. Many people with hypertension should eat less than 1,500 mg of sodium a day. Do not drink alcohol if: Your health care provider tells you not to drink. You are pregnant, may be pregnant, or are planning to become pregnant. If you drink alcohol: Limit how much you have to: 0-1 drink a day for women. 0-2 drinks a day for men. Know how much alcohol is in your drink. In the [place] one drink equals one 12 oz bottle of beer (355 mL), one 5 oz glass of wine (148 mL), or one 1 oz glass of hard liquor (44 mL). Lifestyle  Work with your health care provider to maintain a healthy body weight or to lose weight. Ask what an ideal weight is for you. Get at least 30 minutes of exercise that causes your heart to beat faster (aerobic exercise) most days of the week. Activities may include walking, swimming, or biking. Include exercise to strengthen your muscles (resistance exercise), such as Pilates or lifting weights, as part of your weekly exercise routine. Try to do these types of exercises for 30 minutes at least 3 days a week. Do not use any products that contain nicotine or tobacco. These products include cigarettes, chewing tobacco, and vaping devices, such as e-cigarettes. If you need help quitting, ask your health care provider. Monitor your blood pressure at home as told by your health care provider. Keep all follow-up visits. This is important. Medicines Take over-the-counter and prescription medicines only as told by your health care provider. Follow directions carefully. Blood  pressure medicines must be taken as prescribed. Do not skip doses of blood pressure medicine. Doing this puts you at risk for problems and can make the medicine less effective. Ask your health care provider about side effects or reactions to medicines that you should watch for. Contact a health care provider if you: Think you are having a reaction to a medicine you are taking. Have headaches that keep coming back (recurring). Feel dizzy. Have swelling in your ankles. Have trouble with your vision. Get help right away if you: Develop a severe headache or confusion. Have unusual weakness or numbness. Feel faint. Have severe pain in your chest or abdomen. Vomit repeatedly. Have trouble breathing. These symptoms may be an emergency. Get help right away. Call 911. Do not wait to see if the symptoms will go away. Do not drive yourself to the hospital. Summary Hypertension is when the force of blood pumping through your arteries is too strong. If this condition is not controlled, it may put you at risk for serious complications. Your personal target blood pressure may vary depending on your medical conditions, your age, and other factors. For most people, a normal blood pressure is less than 120/80. Hypertension is treated with lifestyle changes, medicines, or a combination of both. Lifestyle changes include losing weight, eating a healthy,  low-sodium diet, exercising more, and limiting alcohol. This information is not intended to replace advice given to you by your health care provider. Make sure you discuss any questions you have with your health care provider. Document Revised: 05/22/2021 Document Reviewed: 05/22/2021 Elsevier Patient Education  2024 [employer].

## 2024-04-19 NOTE — Telephone Encounter (Signed)
 Called [facility] and spoke w pharmacist mercedes, relayed to her pcp response regarding no cialis  for 7 days. She verbalized understanding a noted the response

## 2024-04-19 NOTE — Telephone Encounter (Signed)
 Copied from CRM #[id number]. Topic: Clinical - Medical Advice >> Apr 19, 2024  3:19 PM Thersia C wrote: Reason for CRM: Jereld calling from [employer] called regarding new prescription  nirmatrelvir /ritonavir  (PAXLOVID ) 20 x 150 MG & 10 x 100MG  TABS , stated its interactive with these two medications, wasn't for sure if patient was aware so wanted to call in regarding this    busPIRone  (BUSPAR ) 10 MG tablet  tadalafil  (CIALIS ) 20 MG tablet

## 2024-04-19 NOTE — Progress Notes (Signed)
 Subjective:  Patient ID: Cory Little, male    DOB: [birth year]  Age: 38 y.o. MRN: [id number]  CC: Hypertension, Hyperlipidemia, Diabetes, and URI   HPI [name] presents for f/up ----  Discussed the use of AI scribe software for clinical note transcription with the patient, who gave verbal consent to proceed.  History of Present Illness Cory Little is a 38 year old male with hypertension and diabetes who presents with elevated blood pressure and cold symptoms.  He has a blood pressure reading of 144/107 mmHg and was unaware of its elevation. He has not been monitoring his blood pressure regularly and only checks it during medical visits. No headaches, blurred vision, chest pain, or shortness of breath.  He experiences excessive thirst and urination and wondered if this could be due to his diuretic medication. He has not taken the diuretic for over a week due to running out. He takes metformin  for diabetes, with a recent dose adjustment from 750 mg to 500 mg due to running out of the higher dose.  He has had cold symptoms for the past three days, including coughing and a stuffy nose, but no sore throat, fever, or chills. He has been taking Alka-Seltzer and Theraflu, which contain decongestants, to manage these symptoms and notes feeling better today compared to previous days.  He had a septoplasty about five years ago and is concerned about the possibility of his septum deviating again, as he sometimes feels like he is breathing out of one nostril. No nasal bleeding or significant mucus production.  He has been exposed to a coworker with allergy-like symptoms and began feeling unwell shortly after. He has not tested for COVID-19 or flu but received a flu shot recently. He has been using home remedies like lemon water with ginger and honey for symptom relief.     Outpatient Medications Prior to Visit  Medication Sig Dispense Refill   busPIRone  (BUSPAR ) 10 MG tablet  Take 1 tablet (10 mg total) by mouth 3 times daily. 270 tablet 0   potassium chloride  (KLOR-CON  10) 10 MEQ tablet Take 1 tablet (10 mEq total) by mouth 2 (two) times daily. 180 tablet 0   tadalafil  (CIALIS ) 20 MG tablet Take 1 tablet (20 mg total) by mouth every other day as needed for erectile dysfunction. 10 tablet 1   telmisartan  (MICARDIS ) 40 MG tablet Take 1 tablet (40 mg total) by mouth daily. 90 tablet 1   tirzepatide  7.5 MG/0.5ML injection vial Inject 7.5 mg into the skin once a week. 2 mL 0   metFORMIN  (GLUCOPHAGE -XR) 750 MG 24 hr tablet Take 1 tablet (750 mg total) by mouth daily with breakfast. 90 tablet 0   triamterene -hydrochlorothiazide  (DYAZIDE) 37.5-25 MG capsule Take 1 each (1 capsule total) by mouth daily. 90 capsule 0   No facility-administered medications prior to visit.    ROS Review of Systems  Constitutional:  Negative for appetite change, chills, diaphoresis, fatigue and fever.  HENT:  Positive for congestion, postnasal drip, rhinorrhea and sore throat. Negative for facial swelling, trouble swallowing and voice change.   Eyes: Negative.   Respiratory: Negative.  Negative for cough, chest tightness, shortness of breath and wheezing.   Cardiovascular:  Negative for chest pain, palpitations and leg swelling.  Gastrointestinal: Negative.  Negative for abdominal pain, constipation, diarrhea, nausea and vomiting.  Genitourinary: Negative.  Negative for difficulty urinating.  Musculoskeletal: Negative.  Negative for arthralgias and myalgias.  Skin: Negative.  Negative for  color change and rash.  Neurological:  Negative for dizziness, weakness and light-headedness.  Hematological:  Negative for adenopathy. Does not bruise/bleed easily.  Psychiatric/Behavioral:  Positive for confusion and decreased concentration.     Objective:  BP (!) 152/106 (BP Location: Left Arm, Patient Position: Sitting, Cuff Size: Large)   Pulse 79   Temp 97.8 F (36.6 C) (Oral)   Resp 16   Ht  5' 10 (1.778 m)   Wt 281 lb 6.4 oz (127.6 kg)   SpO2 95%   BMI 40.38 kg/m   BP Readings from Last 3 Encounters:  04/19/24 (!) 152/106  02/12/24 (!) 148/102  01/08/24 (!) 144/98    Wt Readings from Last 3 Encounters:  04/19/24 281 lb 6.4 oz (127.6 kg)  02/12/24 278 lb 6.4 oz (126.3 kg)  01/08/24 283 lb 12.8 oz (128.7 kg)    Physical Exam Vitals reviewed.  Constitutional:      General: He is not in acute distress.    Appearance: Normal appearance. He is obese. He is not toxic-appearing or diaphoretic.  HENT:     Right Ear: Hearing, tympanic membrane, ear canal and external ear normal.     Left Ear: Hearing, tympanic membrane, ear canal and external ear normal.     Nose: Nose normal.     Right Nostril: No foreign body or epistaxis.     Left Nostril: No foreign body or epistaxis.     Right Turbinates: Not enlarged, swollen or pale.     Left Turbinates: Not enlarged, swollen or pale.     Mouth/Throat:     Pharynx: Oropharynx is clear. No pharyngeal swelling, oropharyngeal exudate, posterior oropharyngeal erythema, uvula swelling or postnasal drip.     Tonsils: No tonsillar exudate or tonsillar abscesses. 0 on the right. 0 on the left.  Eyes:     General: No scleral icterus.    Conjunctiva/sclera: Conjunctivae normal.  Cardiovascular:     Rate and Rhythm: Normal rate and regular rhythm.     Pulses: Normal pulses.     Heart sounds: No murmur heard.    No friction rub. No gallop.  Pulmonary:     Effort: Pulmonary effort is normal.     Breath sounds: No stridor. No wheezing, rhonchi or rales.  Abdominal:     General: Abdomen is protuberant. Bowel sounds are normal. There is no distension.     Palpations: Abdomen is soft. There is no hepatomegaly, splenomegaly or mass.     Tenderness: There is no abdominal tenderness.  Musculoskeletal:        General: Normal range of motion.     Cervical back: Neck supple.     Right lower leg: No edema.     Left lower leg: No edema.   Lymphadenopathy:     Cervical: No cervical adenopathy.  Skin:    General: Skin is warm and dry.  Neurological:     General: No focal deficit present.     Mental Status: He is alert.  Psychiatric:        Attention and Perception: He is inattentive.        Mood and Affect: Mood is anxious.        Speech: Speech is tangential. Speech is not delayed.        Behavior: Behavior normal. Behavior is cooperative.        Thought Content: Thought content normal. Thought content is not paranoid or delusional. Thought content does not include homicidal or suicidal ideation.  Cognition and Memory: Cognition normal. Cognition is not impaired. Memory is not impaired.     Lab Results  Component Value Date   WBC 7.8 11/06/2023   HGB 15.4 11/06/2023   HCT 43.9 11/06/2023   PLT 270.0 11/06/2023   GLUCOSE 125 (H) 01/08/2024   CHOL 243 (H) 12/11/2023   TRIG 241.0 (H) 12/11/2023   HDL 38.50 (L) 12/11/2023   LDLDIRECT 159.0 12/30/2022   LDLCALC 157 (H) 12/11/2023   ALT 40 11/06/2023   AST 24 11/06/2023   NA 140 01/08/2024   K 3.5 01/08/2024   CL 105 01/08/2024   CREATININE 1.47 (H) 01/08/2024   BUN 15 01/08/2024   CO2 22 01/08/2024   TSH 1.26 11/06/2023   HGBA1C 6.5 11/06/2023   MICROALBUR 6.8 (H) 12/11/2023    US  Renal Artery Stenosis Result Date: 02/24/2024 CLINICAL DATA:  Hypertension. EXAM: RENAL/URINARY TRACT ULTRASOUND RENAL DUPLEX DOPPLER ULTRASOUND COMPARISON:  11/17/2023. FINDINGS: Right Kidney: Length: 10.8 cm. Echogenicity within normal limits. No mass or hydronephrosis visualized. Left Kidney: Length: 11.9 cm. Echogenicity within normal limits. No mass or hydronephrosis visualized. Bladder:  Not evaluated. RENAL DUPLEX ULTRASOUND Right Renal Artery Velocities: Origin:  95 cm/sec Mid:  114 cm/sec Hilum:  67 cm/sec Interlobar:  57 cm/sec Arcuate:  28 Cm/sec Left Renal Artery Velocities: Origin:  78 cm/sec Mid:  78 cm/sec Hilum:  69 cm/sec Interlobar:  44 cm/sec Arcuate:  30  cm/sec Aortic Velocity:  86 Cm/sec Right Renal-Aortic Ratios: Origin: 1.1 Mid: 1.3 Hilum: 0.8 Interlobar: 0.7 Arcuate: 0.3 Left Renal-Aortic Ratios: Origin: 0.9 Mid: 0.9 Hilum: 0.8 Interlobar: 0.5 Arcuate: 0.3 IMPRESSION: No evidence of renal artery stenosis. Electronically Signed   By: Fonda Field M.D.   On: 02/24/2024 16:39    Assessment & Plan:  Need for influenza vaccination -     Flu vaccine trivalent PF, 6mos and older(Flulaval,Afluria,Fluarix,Fluzone)  Resistant hypertension -     Basic metabolic panel with GFR; Future -     CBC with Differential/Platelet; Future -     DRUG MONITORING, PANEL 7 WITH CONFIRMATION, URINE; Future -     Urinalysis, Routine w reflex microscopic; Future  Type 2 diabetes mellitus without complication, without long-term current use of insulin  (HCC) -     Basic metabolic panel with GFR; Future -     Hemoglobin A1c; Future -     Hepatitis A antibody, total; Future -     Hepatitis B surface antibody,quantitative; Future -     Urinalysis, Routine w reflex microscopic; Future  Acute pharyngitis due to other specified organisms -     POC COVID-19 BinaxNow -     POCT Influenza A/B  COVID-19 virus infection -     nirmatrelvir /ritonavir ; Take 3 tablets by mouth 2 (two) times daily for 5 days. (Take nirmatrelvir  150 mg two tablets twice daily for 5 days and ritonavir  100 mg one tablet twice daily for 5 days) Patient GFR is 68  Dispense: 30 tablet; Refill: 0  Type 2 diabetes mellitus with hyperglycemia, without long-term current use of insulin  (HCC) -     metFORMIN  HCl ER; Take 1 tablet (750 mg total) by mouth daily with breakfast.  Dispense: 90 tablet; Refill: 0  Malignant hypertension -     Triamterene -HCTZ; Take 1 each (1 capsule total) by mouth daily.  Dispense: 90 capsule; Refill: 0     Follow-up: Return in about 4 weeks (around 05/17/2024).  Debby Molt, MD

## 2024-04-19 NOTE — Telephone Encounter (Signed)
 No cialis  for 7 days

## 2024-04-21 ENCOUNTER — Ambulatory Visit: Payer: Self-pay | Admitting: Internal Medicine

## 2024-04-21 LAB — DRUG MONITORING, PANEL 7 WITH CONFIRMATION, URINE
6 Acetylmorphine: NEGATIVE ng/mL (ref <10)
Alcohol Metabolites: NEGATIVE ng/mL (ref <500)
Amphetamines: NEGATIVE ng/mL (ref <500)
Barbiturates: NEGATIVE ng/mL (ref <300)
Benzodiazepines: NEGATIVE ng/mL (ref <100)
Cocaine Metabolite: NEGATIVE ng/mL (ref <150)
Creatinine: 234.4 mg/dL (ref >=20.0)
Marijuana Metabolite: 109 ng/mL — ABNORMAL HIGH (ref <5)
Marijuana Metabolite: POSITIVE ng/mL — AB (ref <20)
Methadone Metabolite: NEGATIVE ng/mL (ref <100)
Opiates: NEGATIVE ng/mL (ref <100)
Oxidant: NEGATIVE ug/mL (ref <200)
Oxycodone: NEGATIVE ng/mL (ref <100)
pH: 6.9 (ref 4.5–9.0)

## 2024-04-21 LAB — HEPATITIS B SURFACE ANTIBODY, QUANTITATIVE: Hep B S AB Quant (Post): 362 m[IU]/mL (ref >=10)

## 2024-04-21 LAB — DM TEMPLATE

## 2024-04-21 LAB — HEPATITIS A ANTIBODY, TOTAL: Hepatitis A AB,Total: REACTIVE — AB

## 2024-04-22 DIAGNOSIS — E119 Type 2 diabetes mellitus without complications: Secondary | ICD-10-CM | POA: Diagnosis not present

## 2024-04-22 DIAGNOSIS — H02831 Dermatochalasis of right upper eyelid: Secondary | ICD-10-CM | POA: Diagnosis not present

## 2024-04-22 DIAGNOSIS — H02834 Dermatochalasis of left upper eyelid: Secondary | ICD-10-CM | POA: Diagnosis not present

## 2024-04-22 LAB — HM DIABETES EYE EXAM

## 2024-04-29 ENCOUNTER — Other Ambulatory Visit: Payer: Self-pay | Admitting: Internal Medicine

## 2024-04-29 DIAGNOSIS — I119 Hypertensive heart disease without heart failure: Secondary | ICD-10-CM

## 2024-04-29 DIAGNOSIS — I10 Essential (primary) hypertension: Secondary | ICD-10-CM

## 2024-04-29 DIAGNOSIS — E876 Hypokalemia: Secondary | ICD-10-CM

## 2024-04-29 MED ORDER — POTASSIUM CHLORIDE ER 10 MEQ PO TBCR: 10.0000 meq | EXTENDED_RELEASE_TABLET | Freq: Two times a day (BID) | ORAL | 0 refills | Status: AC

## 2024-05-04 ENCOUNTER — Other Ambulatory Visit: Payer: Self-pay | Admitting: Internal Medicine

## 2024-05-04 DIAGNOSIS — F411 Generalized anxiety disorder: Secondary | ICD-10-CM

## 2024-05-20 ENCOUNTER — Ambulatory Visit (INDEPENDENT_AMBULATORY_CARE_PROVIDER_SITE_OTHER): Admitting: Internal Medicine

## 2024-05-20 ENCOUNTER — Encounter: Payer: Self-pay | Admitting: Internal Medicine

## 2024-05-20 VITALS — BP 144/94 | HR 66 | Temp 98.4°F | Resp 16 | Ht 70.0 in | Wt 287.8 lb

## 2024-05-20 DIAGNOSIS — I10 Essential (primary) hypertension: Secondary | ICD-10-CM | POA: Diagnosis not present

## 2024-05-20 DIAGNOSIS — E781 Pure hyperglyceridemia: Secondary | ICD-10-CM | POA: Diagnosis not present

## 2024-05-20 DIAGNOSIS — E119 Type 2 diabetes mellitus without complications: Secondary | ICD-10-CM | POA: Diagnosis not present

## 2024-05-20 DIAGNOSIS — I1A0 Resistant hypertension: Secondary | ICD-10-CM

## 2024-05-20 DIAGNOSIS — E785 Hyperlipidemia, unspecified: Secondary | ICD-10-CM

## 2024-05-20 LAB — LIPID PANEL
Cholesterol: 222 mg/dL — ABNORMAL HIGH (ref 0–200)
HDL: 37.8 mg/dL — ABNORMAL LOW (ref >39.00)
LDL Cholesterol: 135 mg/dL — ABNORMAL HIGH (ref 0–99)
NonHDL: 184.39
Total CHOL/HDL Ratio: 6
Triglycerides: 248 mg/dL — ABNORMAL HIGH (ref 0.0–149.0)
VLDL: 49.6 mg/dL — ABNORMAL HIGH (ref 0.0–40.0)

## 2024-05-20 LAB — BASIC METABOLIC PANEL WITH GFR
BUN: 15 mg/dL (ref 6–23)
CO2: 29 meq/L (ref 19–32)
Calcium: 9.2 mg/dL (ref 8.4–10.5)
Chloride: 103 meq/L (ref 96–112)
Creatinine, Ser: 1.18 mg/dL (ref 0.40–1.50)
GFR: 77.35 mL/min (ref >60.00)
Glucose, Bld: 114 mg/dL — ABNORMAL HIGH (ref 70–99)
Potassium: 3.2 meq/L — ABNORMAL LOW (ref 3.5–5.1)
Sodium: 140 meq/L (ref 135–145)

## 2024-05-20 MED ORDER — ROSUVASTATIN CALCIUM 10 MG PO TABS: 10.0000 mg | ORAL_TABLET | Freq: Every day | ORAL | 1 refills | Status: AC

## 2024-05-20 NOTE — Patient Instructions (Signed)
 Hypertension, Adult High blood pressure (hypertension) is when the force of blood pumping through the arteries is too strong. The arteries are the blood vessels that carry blood from the heart throughout the body. Hypertension forces the heart to work harder to pump blood and may cause arteries to become narrow or stiff. Untreated or uncontrolled hypertension can lead to a heart attack, heart failure, a stroke, kidney disease, and other problems. A blood pressure reading consists of a higher number over a lower number. Ideally, your blood pressure should be below 120/80. The first ("top") number is called the systolic pressure. It is a measure of the pressure in your arteries as your heart beats. The second ("bottom") number is called the diastolic pressure. It is a measure of the pressure in your arteries as the heart relaxes. What are the causes? The exact cause of this condition is not known. There are some conditions that result in high blood pressure. What increases the risk? Certain factors may make you more likely to develop high blood pressure. Some of these risk factors are under your control, including: Smoking. Not getting enough exercise or physical activity. Being overweight. Having too much fat, sugar, calories, or salt (sodium) in your diet. Drinking too much alcohol. Other risk factors include: Having a personal history of heart disease, diabetes, high cholesterol, or kidney disease. Stress. Having a family history of high blood pressure and high cholesterol. Having obstructive sleep apnea. Age. The risk increases with age. What are the signs or symptoms? High blood pressure may not cause symptoms. Very high blood pressure (hypertensive crisis) may cause: Headache. Fast or irregular heartbeats (palpitations). Shortness of breath. Nosebleed. Nausea and vomiting. Vision changes. Severe chest pain, dizziness, and seizures. How is this diagnosed? This condition is diagnosed by  measuring your blood pressure while you are seated, with your arm resting on a flat surface, your legs uncrossed, and your feet flat on the floor. The cuff of the blood pressure monitor will be placed directly against the skin of your upper arm at the level of your heart. Blood pressure should be measured at least twice using the same arm. Certain conditions can cause a difference in blood pressure between your right and left arms. If you have a high blood pressure reading during one visit or you have normal blood pressure with other risk factors, you may be asked to: Return on a different day to have your blood pressure checked again. Monitor your blood pressure at home for 1 week or longer. If you are diagnosed with hypertension, you may have other blood or imaging tests to help your health care provider understand your overall risk for other conditions. How is this treated? This condition is treated by making healthy lifestyle changes, such as eating healthy foods, exercising more, and reducing your alcohol intake. You may be referred for counseling on a healthy diet and physical activity. Your health care provider may prescribe medicine if lifestyle changes are not enough to get your blood pressure under control and if: Your systolic blood pressure is above 130. Your diastolic blood pressure is above 80. Your personal target blood pressure may vary depending on your medical conditions, your age, and other factors. Follow these instructions at home: Eating and drinking  Eat a diet that is high in fiber and potassium, and low in sodium, added sugar, and fat. An example of this eating plan is called the DASH diet. DASH stands for Dietary Approaches to Stop Hypertension. To eat this way: Eat  plenty of fresh fruits and vegetables. Try to fill one half of your plate at each meal with fruits and vegetables. Eat whole grains, such as whole-wheat pasta, brown rice, or whole-grain bread. Fill about one  fourth of your plate with whole grains. Eat or drink low-fat dairy products, such as skim milk or low-fat yogurt. Avoid fatty cuts of meat, processed or cured meats, and poultry with skin. Fill about one fourth of your plate with lean proteins, such as fish, chicken without skin, beans, eggs, or tofu. Avoid pre-made and processed foods. These tend to be higher in sodium, added sugar, and fat. Reduce your daily sodium intake. Many people with hypertension should eat less than 1,500 mg of sodium a day. Do not drink alcohol if: Your health care provider tells you not to drink. You are pregnant, may be pregnant, or are planning to become pregnant. If you drink alcohol: Limit how much you have to: 0-1 drink a day for women. 0-2 drinks a day for men. Know how much alcohol is in your drink. In the [place] one drink equals one 12 oz bottle of beer (355 mL), one 5 oz glass of wine (148 mL), or one 1 oz glass of hard liquor (44 mL). Lifestyle  Work with your health care provider to maintain a healthy body weight or to lose weight. Ask what an ideal weight is for you. Get at least 30 minutes of exercise that causes your heart to beat faster (aerobic exercise) most days of the week. Activities may include walking, swimming, or biking. Include exercise to strengthen your muscles (resistance exercise), such as Pilates or lifting weights, as part of your weekly exercise routine. Try to do these types of exercises for 30 minutes at least 3 days a week. Do not use any products that contain nicotine or tobacco. These products include cigarettes, chewing tobacco, and vaping devices, such as e-cigarettes. If you need help quitting, ask your health care provider. Monitor your blood pressure at home as told by your health care provider. Keep all follow-up visits. This is important. Medicines Take over-the-counter and prescription medicines only as told by your health care provider. Follow directions carefully. Blood  pressure medicines must be taken as prescribed. Do not skip doses of blood pressure medicine. Doing this puts you at risk for problems and can make the medicine less effective. Ask your health care provider about side effects or reactions to medicines that you should watch for. Contact a health care provider if you: Think you are having a reaction to a medicine you are taking. Have headaches that keep coming back (recurring). Feel dizzy. Have swelling in your ankles. Have trouble with your vision. Get help right away if you: Develop a severe headache or confusion. Have unusual weakness or numbness. Feel faint. Have severe pain in your chest or abdomen. Vomit repeatedly. Have trouble breathing. These symptoms may be an emergency. Get help right away. Call 911. Do not wait to see if the symptoms will go away. Do not drive yourself to the hospital. Summary Hypertension is when the force of blood pumping through your arteries is too strong. If this condition is not controlled, it may put you at risk for serious complications. Your personal target blood pressure may vary depending on your medical conditions, your age, and other factors. For most people, a normal blood pressure is less than 120/80. Hypertension is treated with lifestyle changes, medicines, or a combination of both. Lifestyle changes include losing weight, eating a healthy,  low-sodium diet, exercising more, and limiting alcohol. This information is not intended to replace advice given to you by your health care provider. Make sure you discuss any questions you have with your health care provider. Document Revised: 05/22/2021 Document Reviewed: 05/22/2021 Elsevier Patient Education  2024 [employer].

## 2024-05-20 NOTE — Progress Notes (Signed)
 Subjective:  Patient ID: Cory Little, male    DOB: 19-Jul-1986  Age: 38 y.o. MRN: [id number]  CC: Hypertension, Hyperlipidemia, and Diabetes   HPI CLEVEN JANSMA presents for f/up ----  Discussed the use of AI scribe software for clinical note transcription with the patient, who gave verbal consent to proceed.  History of Present Illness Cory Little is a 38 year old male who presents with elevated blood pressure.  He has elevated blood pressure without symptoms such as headache, blurred vision, chest pain, or shortness of breath. He acknowledges that his dietary habits, including frequent consumption of fast food, may contribute to his elevated blood pressure. He recently consumed a hot dog, which he believes may have contributed to the elevation due to its sodium content. He frequently eats at [employer] like [employer] and [employer].  He has not been taking Mounjaro  since June or July due to financial constraints, as the medication costs $200 even with insurance. He has insurance through [employer] [place] , which he finds less comprehensive compared to his previous [place]  plan.  He feels burnt out at work but maintains a stable mood. He has had COVID-19 three or four times but did not experience significant symptoms. He consumes caffeinated beverages, primarily Dr. Nunzio, and acknowledges that his diet could be healthier. He is considering dietary changes such as incorporating grilled chicken and fried eggs, though he is aware of the high sodium content in fast-food options.  He inquires about exercise recommendations and expresses interest in improving his lifestyle to manage his blood pressure better. 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 No headache, blurred vision, chest pain, shortness of breath, edema, or swelling.     Outpatient Medications Prior to Visit  Medication Sig Dispense Refill   busPIRone  (BUSPAR ) 10 MG  tablet Take 1 tablet (10 mg total) by mouth 3 times daily 270 tablet 0   metFORMIN  (GLUCOPHAGE -XR) 750 MG 24 hr tablet Take 1 tablet (750 mg total) by mouth daily with breakfast. 90 tablet 0   potassium chloride  (KLOR-CON  10) 10 MEQ tablet Take 1 tablet (10 mEq total) by mouth 2 (two) times daily. 180 tablet 0   tadalafil  (CIALIS ) 20 MG tablet Take 1 tablet (20 mg total) by mouth every other day as needed for erectile dysfunction. 10 tablet 1   telmisartan  (MICARDIS ) 40 MG tablet Take 1 tablet (40 mg total) by mouth daily. 90 tablet 1   triamterene -hydrochlorothiazide  (DYAZIDE) 37.5-25 MG capsule Take 1 each (1 capsule total) by mouth daily. 90 capsule 0   tirzepatide  7.5 MG/0.5ML injection vial Inject 7.5 mg into the skin once a week. 2 mL 0   No facility-administered medications prior to visit.    ROS Review of Systems  Constitutional:  Positive for unexpected weight change (wt gain).    Objective:  BP (!) 144/94 (BP Location: Left Arm, Patient Position: Sitting, Cuff Size: Large)   Pulse 66   Temp 98.4 F (36.9 C) (Oral)   Resp 16   Ht 5' 10 (1.778 m)   Wt 287 lb 12.8 oz (130.5 kg)   SpO2 96%   BMI 41.30 kg/m   BP Readings from Last 3 Encounters:  05/20/24 (!) 144/94  04/19/24 (!) 152/106  02/12/24 (!) 148/102    Wt Readings from Last 3 Encounters:  05/20/24 287 lb 12.8 oz (130.5 kg)  04/19/24 281 lb 6.4 oz (127.6 kg)  02/12/24 278 lb 6.4 oz (126.3 kg)  Physical Exam  Lab Results  Component Value Date   WBC 8.0 04/19/2024   HGB 14.9 04/19/2024   HCT 43.2 04/19/2024   PLT 251.0 04/19/2024   GLUCOSE 114 (H) 05/20/2024   CHOL 222 (H) 05/20/2024   TRIG 248.0 (H) 05/20/2024   HDL 37.80 (L) 05/20/2024   LDLDIRECT 159.0 12/30/2022   LDLCALC 135 (H) 05/20/2024   ALT 40 11/06/2023   AST 24 11/06/2023   NA 140 05/20/2024   K 3.2 (L) 05/20/2024   CL 103 05/20/2024   CREATININE 1.18 05/20/2024   BUN 15 05/20/2024   CO2 29 05/20/2024   TSH 1.26 11/06/2023    HGBA1C 6.3 04/19/2024   MICROALBUR 6.8 (H) 12/11/2023    US  Renal Artery Stenosis Result Date: 02/24/2024 CLINICAL DATA:  Hypertension. EXAM: RENAL/URINARY TRACT ULTRASOUND RENAL DUPLEX DOPPLER ULTRASOUND COMPARISON:  11/17/2023. FINDINGS: Right Kidney: Length: 10.8 cm. Echogenicity within normal limits. No mass or hydronephrosis visualized. Left Kidney: Length: 11.9 cm. Echogenicity within normal limits. No mass or hydronephrosis visualized. Bladder:  Not evaluated. RENAL DUPLEX ULTRASOUND Right Renal Artery Velocities: Origin:  95 cm/sec Mid:  114 cm/sec Hilum:  67 cm/sec Interlobar:  57 cm/sec Arcuate:  28 Cm/sec Left Renal Artery Velocities: Origin:  78 cm/sec Mid:  78 cm/sec Hilum:  69 cm/sec Interlobar:  44 cm/sec Arcuate:  30 cm/sec Aortic Velocity:  86 Cm/sec Right Renal-Aortic Ratios: Origin: 1.1 Mid: 1.3 Hilum: 0.8 Interlobar: 0.7 Arcuate: 0.3 Left Renal-Aortic Ratios: Origin: 0.9 Mid: 0.9 Hilum: 0.8 Interlobar: 0.5 Arcuate: 0.3 IMPRESSION: No evidence of renal artery stenosis. Electronically Signed   By: Fonda Field M.D.   On: 02/24/2024 16:39    Assessment & Plan:  Type 2 diabetes mellitus without complication, without long-term current use of insulin  (HCC) -     Hepatitis B surface antibody,quantitative; Future -     Basic metabolic panel with GFR; Future  Pure hyperglyceridemia -     Lipid panel; Future  Resistant hypertension -     Basic metabolic panel with GFR; Future     Follow-up: Return in about 3 months (around 08/20/2024).  Debby Molt, MD

## 2024-05-21 ENCOUNTER — Ambulatory Visit: Payer: Self-pay | Admitting: Internal Medicine

## 2024-05-21 LAB — HEPATITIS B SURFACE ANTIBODY, QUANTITATIVE: Hep B S AB Quant (Post): 348 m[IU]/mL (ref >=10)

## 2024-05-31 NOTE — Telephone Encounter (Signed)
 Copied from CRM #[id number]. Topic: General - Other >> May 31, 2024 10:02 AM Rosina BIRCH wrote: Reason for CRM: patient returning a call to [facility] and read the lab results to him. I also let him it was on his mychart

## 2024-06-30 ENCOUNTER — Telehealth: Payer: Self-pay

## 2024-06-30 ENCOUNTER — Ambulatory Visit: Admitting: Internal Medicine

## 2024-06-30 ENCOUNTER — Encounter: Payer: Self-pay | Admitting: Internal Medicine

## 2024-06-30 VITALS — BP 148/98 | HR 74 | Temp 98.5°F | Resp 16 | Ht 70.0 in | Wt 293.0 lb

## 2024-06-30 DIAGNOSIS — E1165 Type 2 diabetes mellitus with hyperglycemia: Secondary | ICD-10-CM

## 2024-06-30 DIAGNOSIS — I119 Hypertensive heart disease without heart failure: Secondary | ICD-10-CM

## 2024-06-30 DIAGNOSIS — I1A0 Resistant hypertension: Secondary | ICD-10-CM

## 2024-06-30 DIAGNOSIS — Z7984 Long term (current) use of oral hypoglycemic drugs: Secondary | ICD-10-CM | POA: Diagnosis not present

## 2024-06-30 MED ORDER — METFORMIN HCL ER 750 MG PO TB24: 750.0000 mg | ORAL_TABLET | Freq: Every day | ORAL | 0 refills | Status: DC

## 2024-06-30 MED ORDER — INSULIN PEN NEEDLE 32G X 6 MM MISC: 1.0000 | 1 refills | Status: AC

## 2024-06-30 MED ORDER — OZEMPIC (0.25 OR 0.5 MG/DOSE) 2 MG/3ML ~~LOC~~ SOPN: 0.2500 mg | PEN_INJECTOR | SUBCUTANEOUS | 0 refills | Status: DC

## 2024-06-30 MED ORDER — AMLODIPINE BESYLATE 5 MG PO TABS: 5.0000 mg | ORAL_TABLET | Freq: Every day | ORAL | 1 refills | Status: AC

## 2024-06-30 NOTE — Progress Notes (Signed)
 Subjective:  Patient ID: Cory Little, male    DOB: Sep 29, 1985  Age: 38 y.o. MRN: [id number]  CC: Diabetes (3 month follow up ) and Hypertension   HPI Cory Little presents for f/up ----  Discussed the use of AI scribe software for clinical note transcription with the patient, who gave verbal consent to proceed.  History of Present Illness Cory Little is a 38 year old male with hypertension and diabetes who presents with elevated blood pressure and weight gain.  No associated symptoms such as headache, blurred vision, chest pain, or shortness of breath. He reports eating more protein, such as steak and burgers, recently.  He acknowledges a recent episode of binge eating during Thanksgiving, which included traditional foods like turkey, ham, and casseroles. He mentions a lack of physical activity due to his work schedule, which involves three jobs, leaving him with little time for exercise. He works in [employer], which involves some physical activity, but he does not engage in regular exercise like walking, swimming, or biking.  He has a history of diabetes and is taking metformin . He has not consumed alcohol in a significant amount of time, noting that he has lost interest in it. He stopped using marijuana last week.  He has been vaccinated against the flu but has chosen not to receive the COVID-19 vaccine due to concerns about side effects.     Outpatient Medications Prior to Visit  Medication Sig Dispense Refill   busPIRone  (BUSPAR ) 10 MG tablet Take 1 tablet (10 mg total) by mouth 3 times daily 270 tablet 0   potassium chloride  (KLOR-CON  10) 10 MEQ tablet Take 1 tablet (10 mEq total) by mouth 2 (two) times daily. 180 tablet 0   rosuvastatin  (CRESTOR ) 10 MG tablet Take 1 tablet (10 mg total) by mouth daily. 90 tablet 1   tadalafil  (CIALIS ) 20 MG tablet Take 1 tablet (20 mg total) by mouth every other day as needed for erectile dysfunction. 10 tablet 1   telmisartan   (MICARDIS ) 40 MG tablet Take 1 tablet (40 mg total) by mouth daily. 90 tablet 1   triamterene -hydrochlorothiazide  (DYAZIDE) 37.5-25 MG capsule Take 1 each (1 capsule total) by mouth daily. 90 capsule 0   metFORMIN  (GLUCOPHAGE -XR) 750 MG 24 hr tablet Take 1 tablet (750 mg total) by mouth daily with breakfast. 90 tablet 0   No facility-administered medications prior to visit.    ROS Review of Systems  Constitutional:  Positive for unexpected weight change (wt gain). Negative for appetite change, chills, diaphoresis and fatigue.  HENT: Negative.    Respiratory:  Positive for apnea. Negative for cough, chest tightness, shortness of breath and wheezing.   Cardiovascular:  Negative for chest pain, palpitations and leg swelling.  Gastrointestinal: Negative.  Negative for abdominal pain, constipation, diarrhea, nausea and vomiting.  Genitourinary:  Negative for difficulty urinating, dysuria and hematuria.  Musculoskeletal: Negative.   Skin: Negative.   Neurological:  Negative for dizziness, weakness, light-headedness and headaches.  Hematological:  Does not bruise/bleed easily.  Psychiatric/Behavioral: Negative.      Objective:  BP (!) 148/98 (BP Location: Left Arm, Patient Position: Sitting, Cuff Size: Normal)   Pulse 74   Temp 98.5 F (36.9 C) (Oral)   Resp 16   Ht 5' 10 (1.778 m)   Wt 293 lb (132.9 kg)   SpO2 96%   BMI 42.04 kg/m   BP Readings from Last 3 Encounters:  06/30/24 (!) 148/98  05/20/24 (!) 144/94  04/19/24 ROLLEN)  152/106    Wt Readings from Last 3 Encounters:  06/30/24 293 lb (132.9 kg)  05/20/24 287 lb 12.8 oz (130.5 kg)  04/19/24 281 lb 6.4 oz (127.6 kg)    Physical Exam Vitals reviewed.  Constitutional:      General: He is not in acute distress.    Appearance: He is obese. He is not toxic-appearing or diaphoretic.  HENT:     Nose: Nose normal.     Mouth/Throat:     Mouth: Mucous membranes are moist.  Eyes:     General: No scleral icterus.     Conjunctiva/sclera: Conjunctivae normal.  Cardiovascular:     Rate and Rhythm: Normal rate and regular rhythm.     Heart sounds: No murmur heard.    No friction rub. No gallop.  Pulmonary:     Effort: Pulmonary effort is normal.     Breath sounds: No stridor. No wheezing, rhonchi or rales.  Abdominal:     General: Abdomen is protuberant. Bowel sounds are normal. There is no distension.     Palpations: There is no hepatomegaly, splenomegaly or mass.     Tenderness: There is no abdominal tenderness. There is no guarding.  Musculoskeletal:        General: Normal range of motion.     Cervical back: Neck supple.     Right lower leg: No edema.     Left lower leg: No edema.  Lymphadenopathy:     Cervical: No cervical adenopathy.  Skin:    General: Skin is dry.  Neurological:     General: No focal deficit present.     Mental Status: He is alert. Mental status is at baseline.  Psychiatric:        Mood and Affect: Mood normal.        Behavior: Behavior normal.     Lab Results  Component Value Date   WBC 8.0 04/19/2024   HGB 14.9 04/19/2024   HCT 43.2 04/19/2024   PLT 251.0 04/19/2024   GLUCOSE 114 (H) 05/20/2024   CHOL 222 (H) 05/20/2024   TRIG 248.0 (H) 05/20/2024   HDL 37.80 (L) 05/20/2024   LDLDIRECT 159.0 12/30/2022   LDLCALC 135 (H) 05/20/2024   ALT 40 11/06/2023   AST 24 11/06/2023   NA 140 05/20/2024   K 3.2 (L) 05/20/2024   CL 103 05/20/2024   CREATININE 1.18 05/20/2024   BUN 15 05/20/2024   CO2 29 05/20/2024   TSH 1.26 11/06/2023   HGBA1C 6.3 04/19/2024   MICROALBUR 6.8 (H) 12/11/2023    US  Renal Artery Stenosis Result Date: 02/24/2024 CLINICAL DATA:  Hypertension. EXAM: RENAL/URINARY TRACT ULTRASOUND RENAL DUPLEX DOPPLER ULTRASOUND COMPARISON:  11/17/2023. FINDINGS: Right Kidney: Length: 10.8 cm. Echogenicity within normal limits. No mass or hydronephrosis visualized. Left Kidney: Length: 11.9 cm. Echogenicity within normal limits. No mass or hydronephrosis  visualized. Bladder:  Not evaluated. RENAL DUPLEX ULTRASOUND Right Renal Artery Velocities: Origin:  95 cm/sec Mid:  114 cm/sec Hilum:  67 cm/sec Interlobar:  57 cm/sec Arcuate:  28 Cm/sec Left Renal Artery Velocities: Origin:  78 cm/sec Mid:  78 cm/sec Hilum:  69 cm/sec Interlobar:  44 cm/sec Arcuate:  30 cm/sec Aortic Velocity:  86 Cm/sec Right Renal-Aortic Ratios: Origin: 1.1 Mid: 1.3 Hilum: 0.8 Interlobar: 0.7 Arcuate: 0.3 Left Renal-Aortic Ratios: Origin: 0.9 Mid: 0.9 Hilum: 0.8 Interlobar: 0.5 Arcuate: 0.3 IMPRESSION: No evidence of renal artery stenosis. Electronically Signed   By: Fonda Field M.D.   On: 02/24/2024 16:39  Assessment & Plan:   Resistant hypertension- He has not achieved his BP goal. He will improve his lifestyle modifications. Will add amlodipine. -     amLODIPine Besylate; Take 1 tablet (5 mg total) by mouth daily.  Dispense: 90 tablet; Refill: 1 -     AMB Referral [facility] Care Management  Hypertensive left ventricular hypertrophy, without heart failure -     amLODIPine Besylate; Take 1 tablet (5 mg total) by mouth daily.  Dispense: 90 tablet; Refill: 1 -     AMB Referral [facility] Care Management  Type 2 diabetes mellitus with hyperglycemia, without long-term current use of insulin  (HCC) -     metFORMIN  HCl ER; Take 1 tablet (750 mg total) by mouth daily with breakfast.  Dispense: 90 tablet; Refill: 0 -     Ozempic  (0.25 or 0.5 MG/DOSE); Inject 0.25 mg into the skin once a week.  Dispense: 3 mL; Refill: 0 -     Insulin  Pen Needle; 1 Act by Does not apply route once a week.  Dispense: 50 each; Refill: 1 -     AMB Referral [facility] Care Management     Follow-up: Return in about 3 months (around 09/28/2024).  Debby Molt, MD

## 2024-06-30 NOTE — Patient Instructions (Signed)
 Hypertension, Adult High blood pressure (hypertension) is when the force of blood pumping through the arteries is too strong. The arteries are the blood vessels that carry blood from the heart throughout the body. Hypertension forces the heart to work harder to pump blood and may cause arteries to become narrow or stiff. Untreated or uncontrolled hypertension can lead to a heart attack, heart failure, a stroke, kidney disease, and other problems. A blood pressure reading consists of a higher number over a lower number. Ideally, your blood pressure should be below 120/80. The first ("top") number is called the systolic pressure. It is a measure of the pressure in your arteries as your heart beats. The second ("bottom") number is called the diastolic pressure. It is a measure of the pressure in your arteries as the heart relaxes. What are the causes? The exact cause of this condition is not known. There are some conditions that result in high blood pressure. What increases the risk? Certain factors may make you more likely to develop high blood pressure. Some of these risk factors are under your control, including: Smoking. Not getting enough exercise or physical activity. Being overweight. Having too much fat, sugar, calories, or salt (sodium) in your diet. Drinking too much alcohol. Other risk factors include: Having a personal history of heart disease, diabetes, high cholesterol, or kidney disease. Stress. Having a family history of high blood pressure and high cholesterol. Having obstructive sleep apnea. Age. The risk increases with age. What are the signs or symptoms? High blood pressure may not cause symptoms. Very high blood pressure (hypertensive crisis) may cause: Headache. Fast or irregular heartbeats (palpitations). Shortness of breath. Nosebleed. Nausea and vomiting. Vision changes. Severe chest pain, dizziness, and seizures. How is this diagnosed? This condition is diagnosed by  measuring your blood pressure while you are seated, with your arm resting on a flat surface, your legs uncrossed, and your feet flat on the floor. The cuff of the blood pressure monitor will be placed directly against the skin of your upper arm at the level of your heart. Blood pressure should be measured at least twice using the same arm. Certain conditions can cause a difference in blood pressure between your right and left arms. If you have a high blood pressure reading during one visit or you have normal blood pressure with other risk factors, you may be asked to: Return on a different day to have your blood pressure checked again. Monitor your blood pressure at home for 1 week or longer. If you are diagnosed with hypertension, you may have other blood or imaging tests to help your health care provider understand your overall risk for other conditions. How is this treated? This condition is treated by making healthy lifestyle changes, such as eating healthy foods, exercising more, and reducing your alcohol intake. You may be referred for counseling on a healthy diet and physical activity. Your health care provider may prescribe medicine if lifestyle changes are not enough to get your blood pressure under control and if: Your systolic blood pressure is above 130. Your diastolic blood pressure is above 80. Your personal target blood pressure may vary depending on your medical conditions, your age, and other factors. Follow these instructions at home: Eating and drinking  Eat a diet that is high in fiber and potassium, and low in sodium, added sugar, and fat. An example of this eating plan is called the DASH diet. DASH stands for Dietary Approaches to Stop Hypertension. To eat this way: Eat  plenty of fresh fruits and vegetables. Try to fill one half of your plate at each meal with fruits and vegetables. Eat whole grains, such as whole-wheat pasta, brown rice, or whole-grain bread. Fill about one  fourth of your plate with whole grains. Eat or drink low-fat dairy products, such as skim milk or low-fat yogurt. Avoid fatty cuts of meat, processed or cured meats, and poultry with skin. Fill about one fourth of your plate with lean proteins, such as fish, chicken without skin, beans, eggs, or tofu. Avoid pre-made and processed foods. These tend to be higher in sodium, added sugar, and fat. Reduce your daily sodium intake. Many people with hypertension should eat less than 1,500 mg of sodium a day. Do not drink alcohol if: Your health care provider tells you not to drink. You are pregnant, may be pregnant, or are planning to become pregnant. If you drink alcohol: Limit how much you have to: 0-1 drink a day for women. 0-2 drinks a day for men. Know how much alcohol is in your drink. In the [place] one drink equals one 12 oz bottle of beer (355 mL), one 5 oz glass of wine (148 mL), or one 1 oz glass of hard liquor (44 mL). Lifestyle  Work with your health care provider to maintain a healthy body weight or to lose weight. Ask what an ideal weight is for you. Get at least 30 minutes of exercise that causes your heart to beat faster (aerobic exercise) most days of the week. Activities may include walking, swimming, or biking. Include exercise to strengthen your muscles (resistance exercise), such as Pilates or lifting weights, as part of your weekly exercise routine. Try to do these types of exercises for 30 minutes at least 3 days a week. Do not use any products that contain nicotine or tobacco. These products include cigarettes, chewing tobacco, and vaping devices, such as e-cigarettes. If you need help quitting, ask your health care provider. Monitor your blood pressure at home as told by your health care provider. Keep all follow-up visits. This is important. Medicines Take over-the-counter and prescription medicines only as told by your health care provider. Follow directions carefully. Blood  pressure medicines must be taken as prescribed. Do not skip doses of blood pressure medicine. Doing this puts you at risk for problems and can make the medicine less effective. Ask your health care provider about side effects or reactions to medicines that you should watch for. Contact a health care provider if you: Think you are having a reaction to a medicine you are taking. Have headaches that keep coming back (recurring). Feel dizzy. Have swelling in your ankles. Have trouble with your vision. Get help right away if you: Develop a severe headache or confusion. Have unusual weakness or numbness. Feel faint. Have severe pain in your chest or abdomen. Vomit repeatedly. Have trouble breathing. These symptoms may be an emergency. Get help right away. Call 911. Do not wait to see if the symptoms will go away. Do not drive yourself to the hospital. Summary Hypertension is when the force of blood pumping through your arteries is too strong. If this condition is not controlled, it may put you at risk for serious complications. Your personal target blood pressure may vary depending on your medical conditions, your age, and other factors. For most people, a normal blood pressure is less than 120/80. Hypertension is treated with lifestyle changes, medicines, or a combination of both. Lifestyle changes include losing weight, eating a healthy,  low-sodium diet, exercising more, and limiting alcohol. This information is not intended to replace advice given to you by your health care provider. Make sure you discuss any questions you have with your health care provider. Document Revised: 05/22/2021 Document Reviewed: 05/22/2021 Elsevier Patient Education  2024 [employer].

## 2024-06-30 NOTE — Telephone Encounter (Signed)
 Copied from CRM #[id number]. Topic: Clinical - Medication Question >> Jun 30, 2024  2:32 PM [name] wrote: Reason for CRM: requesting more information on a Medication for sleep apnea, just left office and forgot to mention during appt. Callback preferred

## 2024-07-05 ENCOUNTER — Telehealth: Payer: Self-pay | Admitting: *Deleted

## 2024-07-05 NOTE — Progress Notes (Unsigned)
 Care Guide Pharmacy Note  07/05/2024 Name: KNOXX BOEDING MRN: [id number] DOB: 01/12/1986  Referred By: Joshua Debby LITTIE, MD Reason for referral: Call Attempt #1 and Complex Care Management (Outreach to schedule referral with pharmacist )   [name] is a 38 y.o. year old male who is a primary care patient of Joshua Debby LITTIE, MD.  [name] was referred to the pharmacist for assistance related to: DMII  An unsuccessful telephone outreach was attempted today to contact the patient who was referred to the pharmacy team for assistance with medication management. Additional attempts will be made to contact the patient.  Thedford Franks, CMA [place]  [facility], [facility] Guide Direct Dial: [phone]  Fax: [phone] Website: [email or web address]

## 2024-07-06 NOTE — Progress Notes (Signed)
 Care Guide Pharmacy Note  07/06/2024 Name: Cory Little MRN: [id number] DOB: 08/18/1985  Referred By: Joshua Debby LITTIE, MD Reason for referral: Call Attempt #1 and Complex Care Management (Outreach to schedule referral with pharmacist )   Cory Little is a 38 y.o. year old male who is a primary care patient of Joshua Debby LITTIE, MD.  Cory Little was referred to the pharmacist for assistance related to: HTN and DMII  Successful contact was made with the patient to discuss pharmacy services including being ready for the pharmacist to call at least 5 minutes before the scheduled appointment time and to have medication bottles and any blood pressure readings ready for review. The patient agreed to meet with the pharmacist via telephone visit on 07/08/2024  Thedford Franks, CMA [facility], [facility] Guide Direct Dial: [phone]  Fax: [phone] Website: [email or web address]

## 2024-07-06 NOTE — Progress Notes (Signed)
 Care Guide Pharmacy Note  07/06/2024 Name: Cory Little MRN: [id number] DOB: 1986/06/16  Referred By: Joshua Debby LITTIE, MD Reason for referral: Call Attempt #1 and Complex Care Management (Outreach to schedule referral with pharmacist )   [name] is a 38 y.o. year old male who is a primary care patient of Joshua Debby LITTIE, MD.  [name] was referred to the pharmacist for assistance related to: HTN and DMII  A second unsuccessful telephone outreach was attempted today to contact the patient who was referred to the pharmacy team for assistance with medication management. Additional attempts will be made to contact the patient.   Thedford Franks, CMA, Care Guide [facility]  [facility], [facility] Guide Direct Dial: [phone]  Fax: [phone] Website: [email or web address]

## 2024-07-08 ENCOUNTER — Other Ambulatory Visit

## 2024-07-08 DIAGNOSIS — I119 Hypertensive heart disease without heart failure: Secondary | ICD-10-CM

## 2024-07-08 DIAGNOSIS — E1165 Type 2 diabetes mellitus with hyperglycemia: Secondary | ICD-10-CM

## 2024-07-08 DIAGNOSIS — I10 Essential (primary) hypertension: Secondary | ICD-10-CM

## 2024-07-08 DIAGNOSIS — E1169 Type 2 diabetes mellitus with other specified complication: Secondary | ICD-10-CM

## 2024-07-08 MED ORDER — TELMISARTAN 40 MG PO TABS: 40.0000 mg | ORAL_TABLET | Freq: Every day | ORAL | 1 refills | Status: AC

## 2024-07-08 MED ORDER — TRIAMTERENE-HCTZ 37.5-25 MG PO CAPS: 1.0000 | ORAL_CAPSULE | Freq: Every day | ORAL | 1 refills | Status: AC

## 2024-07-08 MED ORDER — OZEMPIC (0.25 OR 0.5 MG/DOSE) 2 MG/3ML ~~LOC~~ SOPN: PEN_INJECTOR | SUBCUTANEOUS | 0 refills | Status: DC

## 2024-07-08 NOTE — Progress Notes (Unsigned)
 07/08/2024 Name: Cory Little MRN: [id number] DOB: [birth year]  Chief Complaint  Patient presents with   Diabetes   Hypertension   Medication Management    Cory Little is a 38 y.o. year old male who presented for a telephone visit.   They were referred to the pharmacist by their PCP for assistance in managing diabetes and hypertension.    Subjective:  Care Team: Primary Care Provider: Joshua Debby LITTIE, MD ; Next Scheduled Visit: 09/28/24   Medication Access/Adherence  Current Pharmacy:  [facility] # [address] [place] - [address] Cory Little Phone: [phone] Fax: [phone]   Patient reports affordability concerns with their medications: No  Patient reports access/transportation concerns to their pharmacy: No  Patient reports adherence concerns with their medications:  Yes    Per fill history in chart, telmisartan  30 DS not fill since 01/05/24 and triamterene /hydrochlorothiazide  30 DS not filled since 12/08/23  Diabetes:  Current medications: metformin  XR 750 mg 1 tablet daily Previous medications: has previously been on Ozempic  but stopped due to insurance changing  *Ozempic  Rx sent but pt has not been able to start. Pt's pharmacy is waiting on authorization.  Macrovascular and Microvascular Risk Reduction:  Statin? yes (rosuvastatin ); ACEi/ARB? yes (telmisartan ) Last urinary albumin/creatinine ratio:  Lab Results  Component Value Date   MICRALBCREAT 15.7 12/11/2023   Last eye exam:  Lab Results  Component Value Date   HMDIABEYEEXA No Retinopathy 04/22/2024   Last foot exam: 01/08/2024 Tobacco  Use:  Tobacco Use: Medium Risk (06/30/2024)   Patient History    Smoking Tobacco Use: Former    Smokeless Tobacco Use: Never    Passive Exposure: Not on file   Hypertension:  Current medications: amlodipine  5 mg daily  *Pt reports not currently taking telmisartan  due to not knowing it was supposed to be continued (thought amlodipine  was taking its place) since he did not have a refill at the pharmacy *Pt did not recall triamterene /hydrochlorothiazide  and initially did not think he was taking it then later said he has been taking it Medications previously tried:   Patient does not have a validated, automated, upper arm home BP cuff Current blood pressure readings: n/a  Objective: BP Readings from Last 3 Encounters:  06/30/24 (!) 148/98  05/20/24 (!) 144/94  04/19/24 (!) 152/106     Lab Results  Component Value Date   HGBA1C 6.3 04/19/2024    Lab Results  Component Value Date   CREATININE 1.18 05/20/2024   BUN 15 05/20/2024   NA 140 05/20/2024   K 3.2 (L) 05/20/2024   CL 103 05/20/2024   CO2 29 05/20/2024    Lab Results  Component Value Date   CHOL 222 (H) 05/20/2024   HDL 37.80 (L) 05/20/2024   LDLCALC 135 (H) 05/20/2024   LDLDIRECT 159.0 12/30/2022   TRIG 248.0 (H) 05/20/2024   CHOLHDL 6 05/20/2024    Medications Reviewed Today     Reviewed by Cory Little, RPH (Pharmacist) on 07/08/24 at 1557  Med List Status: <None>   Medication Order Taking? Sig Documenting Provider Last Dose Status Informant  amLODipine  (NORVASC ) 5 MG tablet [id number] Yes Take 1  tablet (5 mg total) by mouth daily. Cory Debby CROME, MD  Active   busPIRone  (BUSPAR ) 10 MG tablet [id number] Yes Take 1 tablet (10 mg total) by mouth 3 times daily Cory Debby CROME, MD  Active   Insulin  Pen Needle 32G X 6 MM MISC [id number]  1 Act by Does not apply route once a week. Cory Debby CROME, MD  Active   metFORMIN  (GLUCOPHAGE -XR) 750 MG 24 hr tablet [id number] Yes Take 1 tablet (750 mg total) by mouth  daily with breakfast. Cory Debby CROME, MD  Active   potassium chloride  (KLOR-CON  10) 10 MEQ tablet [id number]  Take 1 tablet (10 mEq total) by mouth 2 (two) times daily. Cory Debby CROME, MD  Active   rosuvastatin  (CRESTOR ) 10 MG tablet [id number] Yes Take 1 tablet (10 mg total) by mouth daily. Cory Debby CROME, MD  Active   Semaglutide ,0.25 or 0.5MG /DOS, (OZEMPIC , 0.25 OR 0.5 MG/DOSE,) 2 MG/3ML SOPN [id number]  0.25 mg weekly for 4 weeks then increase to 0.5 mg weekly for 2 weeks  Patient not taking: Reported on 07/08/2024   Cory Debby CROME, MD  Active   tadalafil  (CIALIS ) 20 MG tablet [id number] Yes Take 1 tablet (20 mg total) by mouth every other day as needed for erectile dysfunction. Cory Debby CROME, MD  Active   telmisartan  (MICARDIS ) 40 MG tablet [id number]  Take 1 tablet (40 mg total) by mouth daily.  Patient not taking: Reported on 07/08/2024   Cory Debby CROME, MD  Active   triamterene -hydrochlorothiazide  (DYAZIDE) 37.5-25 MG capsule [id number]  Take 1 each (1 capsule total) by mouth daily. Cory Debby CROME, MD  Active               Assessment/Plan:   Diabetes: - Currently controlled; goal A1c <7%. Cardiorenal risk reduction is opportunities for improvement.. Blood pressure is not at goal <130/80. LDL is not at goal.  - Submitted PA for Ozempic  - Likely does not need metformin  if going to be on Ozempic  - Rosuvastatin  recently started, await next lipid panel. LDL goal <70  Hypertension: - Currently uncontrolled, BP goal <130/80. - Reviewed long term cardiovascular and renal outcomes of uncontrolled blood pressure - Reviewed appropriate blood pressure monitoring technique and reviewed goal blood pressure. Recommended to check home blood pressure and heart rate daily  - Recommend to continue amlodipine  5 mg daily and restart telmisartan  and triamterene /hydrochlorothiazide . Refills sent.     Follow Up Plan: 2 weeks  Darrelyn Drum, PharmD, BCPS, CPP Clinical Pharmacist  Practitioner [place] [facility] [phone]

## 2024-07-09 NOTE — Patient Instructions (Signed)
 It was a pleasure speaking with you today!  Start Ozempic  0.25 mg weekly for 4 weeks then increase to 0.5 mg weekly.  Take amlodipine , telmisartan , and triamterene /hydrochlorothiazide  for blood pressure and monitor your blood pressure at home.   Feel free to call with any questions or concerns!  Darrelyn Drum, PharmD, BCPS, CPP Clinical Pharmacist Practitioner [place] [facility] at [facility] [phone]

## 2024-07-12 ENCOUNTER — Other Ambulatory Visit (HOSPITAL_COMMUNITY): Payer: Self-pay

## 2024-07-12 ENCOUNTER — Telehealth: Payer: Self-pay

## 2024-07-12 NOTE — Telephone Encounter (Signed)
 Pharmacy Patient Advocate Encounter   Received notification from Onbase that prior authorization for Ozempic  (0.25 or 0.5 MG/DOSE) 2MG /3ML pen-injectors  is required/requested.   Insurance verification completed.   The patient is insured through [facility].   Per test claim: PA required; PA submitted to above mentioned insurance via Latent Key/confirmation #/EOC [id number] Status is pending

## 2024-07-13 ENCOUNTER — Other Ambulatory Visit (HOSPITAL_COMMUNITY): Payer: Self-pay

## 2024-07-13 NOTE — Telephone Encounter (Signed)
 Pharmacy Patient Advocate Encounter   Received notification from Onbase that prior authorization for Ozempic  (0.25 or 0.5 MG/DOSE) 2MG /3ML pen-injectors  is due for renewal.   Insurance verification completed.   The patient is insured through [facility].  Action: Refill too soon. PA is not needed at this time. Medication was filled 07/09/2024. Next eligible fill date is 07/30/2024.

## 2024-07-14 ENCOUNTER — Encounter (HOSPITAL_COMMUNITY): Payer: Self-pay | Admitting: Emergency Medicine

## 2024-07-14 ENCOUNTER — Ambulatory Visit (HOSPITAL_COMMUNITY)
Admission: EM | Admit: 2024-07-14 | Discharge: 2024-07-14 | Disposition: A | Discharge status: Home / Self Care | Attending: Family Medicine | Admitting: Family Medicine

## 2024-07-14 DIAGNOSIS — R051 Acute cough: Secondary | ICD-10-CM | POA: Diagnosis not present

## 2024-07-14 DIAGNOSIS — B349 Viral infection, unspecified: Secondary | ICD-10-CM | POA: Diagnosis not present

## 2024-07-14 DIAGNOSIS — R5383 Other fatigue: Secondary | ICD-10-CM

## 2024-07-14 LAB — POC COVID19/FLU A&B COMBO
Covid Antigen, POC: NEGATIVE
Influenza A Antigen, POC: NEGATIVE
Influenza B Antigen, POC: NEGATIVE

## 2024-07-14 NOTE — Telephone Encounter (Signed)
 Patient has been made aware and gave a verbal understanding.

## 2024-07-14 NOTE — ED Triage Notes (Signed)
 Yesterday started having cough, sore throat, fatigue. Denies taking any medications for symptoms.

## 2024-07-14 NOTE — Discharge Instructions (Addendum)
 You were seen today upper respiratory symptoms.  Your flu/covid was negative.  Your symptoms are likely due to another virus.  I recommend you get rest and fluids.  You may use tylenol /motrin for sore throat and body aches.  Please return if you are not improving or worsening.

## 2024-07-14 NOTE — ED Provider Notes (Signed)
 [facility]    CSN: [id number] Arrival date & time: 07/14/24  1242      History   Chief Complaint Chief Complaint  Patient presents with   Cough   Sore Throat    HPI Cory Little is a 38 y.o. male.    Cough Associated symptoms: myalgias   Sore Throat  Patient is here for URI symptoms since yesterday  Having a sore throat, cough.  No sinus drainage.  Feeling very weak/tired.  No fevers/chills.  Some body aches.  No medications taken.  No known sick contacts.        Past Medical History:  Diagnosis Date   ADD (attention deficit disorder)    Chest pain    Chest tightness    Depression    GERD (gastroesophageal reflux disease)    Hypertension    Intolerance to cold    Kidney stone 2004   Kidney stone    Pre-diabetes    Sleep apnea     Patient Active Problem List   Diagnosis Date Noted   Dyslipidemia, goal LDL below 100 05/20/2024   Need for influenza vaccination 04/19/2024   Immunization due 12/12/2023   Diabetes mellitus (HCC) 12/11/2023   Pure hyperglyceridemia 12/11/2023   Morbid obesity (HCC) 02/13/2023   Abnormal electrocardiogram (ECG) (EKG) 12/30/2022   Encounter for general adult medical examination with abnormal findings 02/26/2021   Drug-induced erectile dysfunction 06/22/2019   Diuretic-induced hypokalemia 03/11/2019   Current moderate episode of major depressive disorder (HCC) 01/06/2018   Vitamin D  deficiency 09/22/2017   Obstructive sleep apnea syndrome 09/08/2017   Marijuana abuse 08/21/2017   Hypertension 08/14/2017   Hypertensive left ventricular hypertrophy, without heart failure 08/14/2017   OSA on CPAP 11/23/2015   GERD 02/17/2009   Insomnia with sleep apnea 02/16/2009    Past Surgical History:  Procedure Laterality Date   FACIAL COSMETIC SURGERY     facial liposuction     KIDNEY STONE SURGERY     WISDOM TOOTH EXTRACTION  2016       Home Medications    Prior to Admission medications  Medication Sig  Start Date End Date Taking? Authorizing Provider  Semaglutide ,0.25 or 0.5MG /DOS, (OZEMPIC , 0.25 OR 0.5 MG/DOSE,) 2 MG/3ML SOPN 0.25 mg weekly for 4 weeks then increase to 0.5 mg weekly for 2 weeks 07/08/24  Yes Joshua Debby LITTIE, MD  amLODipine  (NORVASC ) 5 MG tablet Take 1 tablet (5 mg total) by mouth daily. 06/30/24   Joshua Debby LITTIE, MD  busPIRone  (BUSPAR ) 10 MG tablet Take 1 tablet (10 mg total) by mouth 3 times daily 05/05/24   Joshua Debby LITTIE, MD  Insulin  Pen Needle 32G X 6 MM MISC 1 Act by Does not apply route once a week. 06/30/24   Joshua Debby LITTIE, MD  metFORMIN  (GLUCOPHAGE -XR) 750 MG 24 hr tablet Take 1 tablet (750 mg total) by mouth daily with breakfast. 06/30/24   Joshua Debby LITTIE, MD  potassium chloride  (KLOR-CON  10) 10 MEQ tablet Take 1 tablet (10 mEq total) by mouth 2 (two) times daily. 04/29/24   Joshua Debby LITTIE, MD  rosuvastatin  (CRESTOR ) 10 MG tablet Take 1 tablet (10 mg total) by mouth daily. 05/20/24   Joshua Debby LITTIE, MD  tadalafil  (CIALIS ) 20 MG tablet Take 1 tablet (20 mg total) by mouth every other day as needed for erectile dysfunction. 04/16/24   Joshua Debby LITTIE, MD  telmisartan  (MICARDIS ) 40 MG tablet Take 1 tablet (40 mg total) by mouth daily. Patient not taking:  Reported on 07/08/2024 07/08/24   Joshua Debby CROME, MD  triamterene -hydrochlorothiazide  (DYAZIDE) 37.5-25 MG capsule Take 1 each (1 capsule total) by mouth daily. 07/08/24   Joshua Debby CROME, MD    Family History Family History  Problem Relation Age of Onset   Sleep apnea Father    Depression Mother     Social History Social History[1]   Allergies   Wellbutrin  [bupropion ]   Review of Systems Review of Systems  Constitutional:  Positive for fatigue.  HENT: Negative.    Respiratory:  Positive for cough.   Gastrointestinal: Negative.   Musculoskeletal:  Positive for myalgias.     Physical Exam Triage Vital Signs ED Triage Vitals  Encounter Vitals Group     BP 07/14/24 1309 (!) 135/92     Girls  Systolic BP Percentile --      Girls Diastolic BP Percentile --      Boys Systolic BP Percentile --      Boys Diastolic BP Percentile --      Pulse Rate 07/14/24 1309 (!) 103     Resp 07/14/24 1309 17     Temp 07/14/24 1309 98.5 F (36.9 C)     Temp Source 07/14/24 1309 Oral     SpO2 07/14/24 1309 95 %     Weight --      Height --      Head Circumference --      Peak Flow --      Pain Score 07/14/24 1308 2     Pain Loc --      Pain Education --      Exclude from Growth Chart --    No data found.  Updated Vital Signs BP (!) 135/92 (BP Location: Left Arm)   Pulse (!) 103   Temp 98.5 F (36.9 C) (Oral)   Resp 17   SpO2 95%   Visual Acuity Right Eye Distance:   Left Eye Distance:   Bilateral Distance:    Right Eye Near:   Left Eye Near:    Bilateral Near:     Physical Exam Constitutional:      General: He is not in acute distress.    Appearance: He is well-developed and normal weight. He is not ill-appearing or toxic-appearing.  HENT:     Nose: Congestion present. No rhinorrhea.     Mouth/Throat:     Mouth: Mucous membranes are moist. Mucous membranes are pale.     Pharynx: Posterior oropharyngeal erythema present. No pharyngeal swelling or oropharyngeal exudate.     Tonsils: No tonsillar exudate. 0 on the right. 0 on the left.  Cardiovascular:     Rate and Rhythm: Normal rate and regular rhythm.     Heart sounds: Normal heart sounds.  Pulmonary:     Effort: Pulmonary effort is normal.  Musculoskeletal:     Cervical back: Normal range of motion and neck supple.  Lymphadenopathy:     Cervical: No cervical adenopathy.  Skin:    General: Skin is warm.  Neurological:     General: No focal deficit present.     Mental Status: He is alert.  Psychiatric:        Mood and Affect: Mood normal.      UC Treatments / Results  Labs (all labs ordered are listed, but only abnormal results are displayed) Labs Reviewed - No data to display  EKG   Radiology No  results found.  Procedures Procedures (including critical care time)  Medications Ordered in UC  Medications - No data to display  Initial Impression / Assessment and Plan / UC Course  I have reviewed the triage vital signs and the nursing notes.  Pertinent labs & imaging results that were available during my care of the patient were reviewed by me and considered in my medical decision making (see chart for details).   Final Clinical Impressions(s) / UC Diagnoses   Final diagnoses:  Acute cough  Viral syndrome  Other fatigue     Discharge Instructions      You were seen today upper respiratory symptoms.  Your flu/covid was negative.  Your symptoms are likely due to another virus.  I recommend you get rest and fluids.  You may use tylenol /motrin for sore throat and body aches.  Please return if you are not improving or worsening.     ED Prescriptions   None    PDMP not reviewed this encounter.     [1]  Social History Tobacco Use   Smoking status: Former    Types: Cigarettes   Smokeless tobacco: Never  Vaping Use   Vaping status: Never Used  Substance Use Topics   Alcohol use: Not Currently    Comment: occasionally   Drug use: Yes    Types: Marijuana     Darral Longs, MD 07/14/24 1347

## 2024-07-30 ENCOUNTER — Telehealth: Payer: Self-pay | Admitting: Pharmacist

## 2024-07-30 ENCOUNTER — Telehealth: Payer: Self-pay | Admitting: Internal Medicine

## 2024-07-30 DIAGNOSIS — E1165 Type 2 diabetes mellitus with hyperglycemia: Secondary | ICD-10-CM

## 2024-07-30 MED ORDER — OZEMPIC (0.25 OR 0.5 MG/DOSE) 2 MG/3ML ~~LOC~~ SOPN: 0.5000 mg | PEN_INJECTOR | SUBCUTANEOUS | 0 refills | Status: AC

## 2024-07-30 NOTE — Telephone Encounter (Addendum)
 "  07/30/2024 Name: Cory Little MRN: [id number] DOB: 06-19-1986  Chief Complaint  Patient presents with   Pharmacist Follow up    Cory Little is a 39 y.o. year old male who presented for a telephone visit.   They were referred to the pharmacist by their PCP for assistance in managing diabetes and hypertension.   Subjective:  Care Team: Primary Care Provider: Joshua Debby LITTIE, MD ; Next Scheduled Visit: 09/28/24   Medication Access/Adherence  Current Pharmacy:  [facility] # [address], [place] - [address] [place] [address] Phone: [phone] Fax: [phone]  [id number] - [place], [place] - [address] [place] [address] Phone: [phone] Fax: [phone]  [facility] [place], [place] - [phone] [place] [address] Phone: [phone] Fax: [phone]   Patient reports affordability concerns with their medications: No  Patient reports access/transportation concerns to their pharmacy: No  Patient reports adherence concerns with their medications:  Yes    Per fill history in chart, telmisartan  30 DS not fill since 01/05/24 and triamterene /hydrochlorothiazide  30 DS not filled since 12/08/23  Diabetes:  Current medications: Ozempic  0.25 mg weekly (on 3rd week) Previous medications: has previously been on Ozempic  but stopped due to insurance changing  Pt notes he has tolerated Ozempic  0.25 mg without issue  Macrovascular and Microvascular Risk Reduction:  Statin? yes (rosuvastatin ); ACEi/ARB? yes (telmisartan ) Last urinary albumin/creatinine ratio:  Lab Results  Component Value Date   MICRALBCREAT 15.7 12/11/2023   Last eye exam:  Lab Results  Component Value Date   HMDIABEYEEXA No Retinopathy 04/22/2024   Last foot exam: 01/08/2024 Tobacco Use:  Tobacco Use: Medium Risk (06/30/2024)   Patient History     Smoking Tobacco Use: Former    Smokeless Tobacco Use: Never    Passive Exposure: Not on file   Hypertension:  Current medications: amlodipine  5 mg daily, telmisartan  40 mg daily, triamterene /hydrochlorothiazide  37.5/25 mg daily   Patient doeshave a validated, automated, upper arm home BP cuff Current blood pressure readings: pt reports he checked it once and it was still high. He is planning on checking it more.  Objective: BP Readings from Last 3 Encounters:  07/14/24 (!) 135/92  06/30/24 (!) 148/98  05/20/24 (!) 144/94     Lab Results  Component Value Date   HGBA1C 6.3 04/19/2024    Lab Results  Component Value Date   CREATININE 1.18 05/20/2024   BUN 15 05/20/2024   NA 140 05/20/2024   K 3.2 (L) 05/20/2024   CL 103 05/20/2024   CO2 29 05/20/2024    Lab Results  Component Value Date   CHOL 222 (H) 05/20/2024   HDL 37.80 (L) 05/20/2024   LDLCALC 135 (H) 05/20/2024   LDLDIRECT 159.0 12/30/2022   TRIG 248.0 (H) 05/20/2024   CHOLHDL 6 05/20/2024    Medications Reviewed Today   Medications were not reviewed in this encounter       Assessment/Plan:   Diabetes: - Currently controlled; goal A1c <7%. Cardiorenal risk reduction is opportunities for improvement.. Blood pressure is not at goal <130/80. LDL is not at goal.  - Increase Ozempic  to 0.5 mg weekly - Rosuvastatin  recently started, await next lipid panel. LDL goal <70  Hypertension: - Currently uncontrolled, BP goal <130/80. - Reviewed long term cardiovascular and renal outcomes of uncontrolled blood pressure - Reviewed appropriate blood pressure monitoring  technique and reviewed goal blood pressure. Recommended to check home blood pressure and heart rate daily  - Will follow up for BP readings    Follow Up Plan: 4 weeks  Darrelyn Drum, PharmD, BCPS, CPP Clinical Pharmacist Practitioner [place] [facility] at [facility] [phone]    "

## 2024-07-30 NOTE — Addendum Note (Signed)
 Addended by: MERCEDA GRACES R on: 07/30/2024 02:00 PM   Modules accepted: Orders

## 2024-07-30 NOTE — Telephone Encounter (Signed)
 Copied from CRM [id number]. Topic: Clinical - Prescription Issue >> Jul 30, 2024  3:05 PM Alfonso HERO wrote: Reason for CRM: [facility] pharmacy calling for clarification on dosage asking for a call back. [phone]

## 2024-07-30 NOTE — Telephone Encounter (Signed)
 Contacted patient to follow up regarding BP readings and Ozempic  new start. No answer, unable to leave voicemail.  Darrelyn Drum, PharmD, BCPS, CPP Clinical Pharmacist Practitioner [place] [facility] at [facility] [phone]

## 2024-08-02 ENCOUNTER — Telehealth: Payer: Self-pay | Admitting: Internal Medicine

## 2024-08-02 NOTE — Telephone Encounter (Unsigned)
 Copied from CRM [id number]. Topic: Clinical - Medication Question >> Aug 02, 2024 11:56 AM Tiffini S wrote: Reason for CRM:  Melissa with [employer] called about the instructions for  Semaglutide ,0.25 or 0.5MG /DOS, (OZEMPIC , 0.25 OR 0.5 MG/DOSE,) 2 MG/3ML SOPN Asked for clarification for the dosage  Please call [phone] to discuss

## 2024-08-04 NOTE — Telephone Encounter (Signed)
 I have called and clarified the instructions with the pharmacy.

## 2024-08-04 NOTE — Telephone Encounter (Signed)
 Clarified the script for the pharmacist.

## 2024-09-02 ENCOUNTER — Telehealth: Payer: Self-pay | Admitting: Pharmacist

## 2024-09-02 DIAGNOSIS — E1165 Type 2 diabetes mellitus with hyperglycemia: Secondary | ICD-10-CM

## 2024-09-02 NOTE — Progress Notes (Signed)
 "  09/02/2024 Name: CHRISTOPH COPELAN MRN: [id number] DOB: 04/14/1986  Chief Complaint  Patient presents with   Diabetes   Medication Management    VICKEY EWBANK is a 39 y.o. year old male who presented for a telephone visit.   They were referred to the pharmacist by their PCP for assistance in managing diabetes and hypertension.   Subjective:  Care Team: Primary Care Provider: Joshua Debby LITTIE, MD ; Next Scheduled Visit: 09/28/24   Medication Access/Adherence  Current Pharmacy:  [facility] # [address] [place] - [address] [place] [address] Phone: [phone] Fax: [phone]  [id number] - [place], [place] - [address] [place] [address] Phone: [phone] Fax: [phone]  [facility], [place] - [phone] [place] [address] Phone: [phone] Fax: [phone]   Patient reports affordability concerns with their medications: No  Patient reports access/transportation concerns to their pharmacy: No  Patient reports adherence concerns with their medications:  Yes    Per fill history in chart, telmisartan  30 DS not fill since 01/05/24 and triamterene /hydrochlorothiazide  30 DS not filled since 12/08/23  Diabetes:  Current medications: Ozempic  0.5 mg weekly  Previous medications: has previously been on Ozempic  but stopped due to insurance changing  Pt notes he has tolerated Ozempic  0.5 mg without issue - he has 3 weeks remaining. He is interested in increasing to 1 mg. Notes he has noticed weight loss since starting Ozempic   Macrovascular and Microvascular Risk Reduction:  Statin? yes (rosuvastatin ); ACEi/ARB? yes (telmisartan ) Last urinary albumin/creatinine ratio:  Lab Results  Component Value Date   MICRALBCREAT 15.7 12/11/2023   Last eye exam:  Lab Results  Component Value Date   HMDIABEYEEXA No  Retinopathy 04/22/2024   Last foot exam: 01/08/2024 Tobacco Use:  Tobacco Use: Medium Risk (06/30/2024)   Patient History    Smoking Tobacco Use: Former    Smokeless Tobacco Use: Never    Passive Exposure: Not on file   Hypertension:  Current medications: amlodipine  5 mg daily, telmisartan  40 mg daily, triamterene /hydrochlorothiazide  37.5/25 mg daily   Patient doeshave a validated, automated, upper arm home BP cuff Current blood pressure readings: has not checked  Objective: BP Readings from Last 3 Encounters:  07/14/24 (!) 135/92  06/30/24 (!) 148/98  05/20/24 (!) 144/94     Lab Results  Component Value Date   HGBA1C 6.3 04/19/2024    Lab Results  Component Value Date   CREATININE 1.18 05/20/2024   BUN 15 05/20/2024   NA 140 05/20/2024   K 3.2 (L) 05/20/2024   CL 103 05/20/2024   CO2 29 05/20/2024    Lab Results  Component Value Date   CHOL 222 (H) 05/20/2024   HDL 37.80 (L) 05/20/2024   LDLCALC 135 (H) 05/20/2024   LDLDIRECT 159.0 12/30/2022   TRIG 248.0 (H) 05/20/2024   CHOLHDL 6 05/20/2024    Medications Reviewed Today   Medications were not reviewed in this encounter       Assessment/Plan:   Diabetes: - Currently controlled; goal A1c <7%. Cardiorenal risk reduction is opportunities for improvement.. Blood pressure is not at goal <130/80. LDL is not at goal.  - Increase Ozempic  to 1 mg weekly - will send Rx in about 1.5 weeks - Rosuvastatin  recently started, await next lipid panel. LDL goal <70  Hypertension: - Currently uncontrolled, BP goal <130/80. - Reviewed  long term cardiovascular and renal outcomes of uncontrolled blood pressure - Reviewed appropriate blood pressure monitoring technique and reviewed goal blood pressure. Recommended to check home blood pressure and heart rate daily  - Will follow up for BP readings    Follow Up Plan: 4 weeks  Darrelyn Drum, PharmD, BCPS, CPP Clinical Pharmacist Practitioner [facility]  [facility] [phone]    "

## 2024-09-28 ENCOUNTER — Ambulatory Visit: Admitting: Internal Medicine
